# Patient Record
Sex: Male | Born: 1950
Health system: Southern US, Community
[De-identification: ages and names within clinical notes are randomized; demographics above are authoritative.]

## PROBLEM LIST (undated history)

## (undated) DIAGNOSIS — F32A Depression, unspecified: Secondary | ICD-10-CM

## (undated) DIAGNOSIS — H9190 Unspecified hearing loss, unspecified ear: Secondary | ICD-10-CM

## (undated) DIAGNOSIS — H33319 Horseshoe tear of retina without detachment, unspecified eye: Secondary | ICD-10-CM

## (undated) DIAGNOSIS — F329 Major depressive disorder, single episode, unspecified: Secondary | ICD-10-CM

## (undated) DIAGNOSIS — M545 Low back pain, unspecified: Secondary | ICD-10-CM

## (undated) DIAGNOSIS — F419 Anxiety disorder, unspecified: Secondary | ICD-10-CM

## (undated) DIAGNOSIS — E785 Hyperlipidemia, unspecified: Secondary | ICD-10-CM

## (undated) DIAGNOSIS — B029 Zoster without complications: Secondary | ICD-10-CM

## (undated) DIAGNOSIS — M1990 Unspecified osteoarthritis, unspecified site: Secondary | ICD-10-CM

## (undated) DIAGNOSIS — G8929 Other chronic pain: Secondary | ICD-10-CM

## (undated) DIAGNOSIS — M5412 Radiculopathy, cervical region: Secondary | ICD-10-CM

## (undated) DIAGNOSIS — F4320 Adjustment disorder, unspecified: Secondary | ICD-10-CM

## (undated) DIAGNOSIS — L409 Psoriasis, unspecified: Secondary | ICD-10-CM

## (undated) HISTORY — DX: Psoriasis, unspecified: L40.9

## (undated) HISTORY — PX: EYE SURGERY: SHX253

## (undated) HISTORY — DX: Horseshoe tear of retina without detachment, unspecified eye: H33.319

## (undated) HISTORY — PX: OTHER SURGICAL HISTORY: SHX169

## (undated) HISTORY — DX: Radiculopathy, cervical region: M54.12

## (undated) HISTORY — DX: Other chronic pain: G89.29

## (undated) HISTORY — PX: CATARACT EXTRACTION: SUR2

## (undated) HISTORY — DX: Anxiety disorder, unspecified: F41.9

## (undated) HISTORY — DX: Major depressive disorder, single episode, unspecified: F32.9

## (undated) HISTORY — DX: Adjustment disorder, unspecified: F43.20

## (undated) HISTORY — PX: HEMORRHOID SURGERY: SHX153

## (undated) HISTORY — DX: Low back pain, unspecified: M54.50

## (undated) HISTORY — DX: Low back pain: M54.5

## (undated) HISTORY — DX: Hyperlipidemia, unspecified: E78.5

## (undated) HISTORY — DX: Depression, unspecified: F32.A

---

## 2005-09-25 ENCOUNTER — Ambulatory Visit: Payer: Self-pay | Admitting: Internal Medicine

## 2005-12-03 ENCOUNTER — Ambulatory Visit: Payer: Self-pay | Admitting: Internal Medicine

## 2006-04-30 ENCOUNTER — Ambulatory Visit: Payer: Self-pay | Admitting: Internal Medicine

## 2006-09-27 ENCOUNTER — Encounter: Payer: Self-pay | Admitting: Internal Medicine

## 2006-09-27 DIAGNOSIS — H903 Sensorineural hearing loss, bilateral: Secondary | ICD-10-CM | POA: Insufficient documentation

## 2006-09-27 DIAGNOSIS — F329 Major depressive disorder, single episode, unspecified: Secondary | ICD-10-CM

## 2006-09-27 DIAGNOSIS — F411 Generalized anxiety disorder: Secondary | ICD-10-CM | POA: Insufficient documentation

## 2006-09-27 DIAGNOSIS — M545 Low back pain: Secondary | ICD-10-CM

## 2006-10-14 ENCOUNTER — Ambulatory Visit: Payer: Self-pay | Admitting: Internal Medicine

## 2006-10-28 ENCOUNTER — Encounter: Admission: RE | Admit: 2006-10-28 | Discharge: 2006-10-28 | Payer: Self-pay | Admitting: Internal Medicine

## 2006-12-03 ENCOUNTER — Ambulatory Visit: Payer: Self-pay | Admitting: Internal Medicine

## 2006-12-03 LAB — CONVERTED CEMR LAB
Basophils Relative: 0.1 % (ref 0.0–1.0)
Bilirubin Urine: NEGATIVE
CO2: 31 meq/L (ref 19–32)
Calcium: 9.4 mg/dL (ref 8.4–10.5)
Creatinine, Ser: 0.8 mg/dL (ref 0.4–1.5)
Eosinophils Relative: 2.3 % (ref 0.0–5.0)
HCT: 44.3 % (ref 39.0–52.0)
HDL: 38.6 mg/dL — ABNORMAL LOW (ref >39.0)
Hemoglobin: 15.5 g/dL (ref 13.0–17.0)
Ketones, ur: NEGATIVE mg/dL
MCHC: 34.9 g/dL (ref 30.0–36.0)
MCV: 89.1 fL (ref 78.0–100.0)
Monocytes Relative: 11.5 % — ABNORMAL HIGH (ref 3.0–11.0)
Neutro Abs: 3.6 10*3/uL (ref 1.4–7.7)
Potassium: 4.8 meq/L (ref 3.5–5.1)
RDW: 12.3 % (ref 11.5–14.6)
Specific Gravity, Urine: 1.02 (ref 1.000–1.03)
Total Bilirubin: 0.8 mg/dL (ref 0.3–1.2)
Total Protein, Urine: NEGATIVE mg/dL
Triglycerides: 97 mg/dL (ref 0–149)
Urine Glucose: NEGATIVE mg/dL
Urobilinogen, UA: 0.2 (ref 0.0–1.0)
WBC: 7.2 10*3/uL (ref 4.5–10.5)
pH: 5.5 (ref 5.0–8.0)

## 2006-12-09 ENCOUNTER — Ambulatory Visit: Payer: Self-pay | Admitting: Internal Medicine

## 2006-12-09 DIAGNOSIS — E785 Hyperlipidemia, unspecified: Secondary | ICD-10-CM

## 2006-12-09 DIAGNOSIS — J309 Allergic rhinitis, unspecified: Secondary | ICD-10-CM

## 2007-02-17 ENCOUNTER — Ambulatory Visit: Payer: Self-pay | Admitting: Internal Medicine

## 2007-02-17 DIAGNOSIS — R5383 Other fatigue: Secondary | ICD-10-CM | POA: Insufficient documentation

## 2007-02-17 DIAGNOSIS — R5381 Other malaise: Secondary | ICD-10-CM

## 2007-04-01 ENCOUNTER — Encounter: Payer: Self-pay | Admitting: Internal Medicine

## 2007-12-03 ENCOUNTER — Ambulatory Visit: Payer: Self-pay | Admitting: Internal Medicine

## 2008-01-21 DIAGNOSIS — F4320 Adjustment disorder, unspecified: Secondary | ICD-10-CM

## 2008-01-21 HISTORY — DX: Adjustment disorder, unspecified: F43.20

## 2008-03-20 ENCOUNTER — Telehealth (INDEPENDENT_AMBULATORY_CARE_PROVIDER_SITE_OTHER): Payer: Self-pay | Admitting: *Deleted

## 2008-04-28 ENCOUNTER — Ambulatory Visit: Payer: Self-pay | Admitting: Internal Medicine

## 2008-04-28 DIAGNOSIS — K648 Other hemorrhoids: Secondary | ICD-10-CM

## 2008-05-04 ENCOUNTER — Ambulatory Visit: Payer: Self-pay | Admitting: Internal Medicine

## 2008-05-04 LAB — CONVERTED CEMR LAB
Albumin: 4 g/dL (ref 3.5–5.2)
Basophils Relative: 0.2 % (ref 0.0–3.0)
Calcium: 8.9 mg/dL (ref 8.4–10.5)
Chloride: 104 meq/L (ref 96–112)
Cholesterol: 184 mg/dL (ref 0–200)
Creatinine, Ser: 0.9 mg/dL (ref 0.4–1.5)
Eosinophils Absolute: 0.1 10*3/uL (ref 0.0–0.7)
GFR calc non Af Amer: 92.89 mL/min (ref >60)
Glucose, Bld: 88 mg/dL (ref 70–99)
Hemoglobin: 14.7 g/dL (ref 13.0–17.0)
LDL Cholesterol: 126 mg/dL — ABNORMAL HIGH (ref 0–99)
Lymphocytes Relative: 43 % (ref 12.0–46.0)
MCHC: 35.1 g/dL (ref 30.0–36.0)
MCV: 89.8 fL (ref 78.0–100.0)
Neutrophils Relative %: 45.4 % (ref 43.0–77.0)
Platelets: 163 10*3/uL (ref 150.0–400.0)
Potassium: 4.1 meq/L (ref 3.5–5.1)
RBC: 4.65 M/uL (ref 4.22–5.81)
TSH: 1.92 microintl units/mL (ref 0.35–5.50)
Total Protein, Urine: NEGATIVE mg/dL
Triglycerides: 99 mg/dL (ref 0.0–149.0)
Urine Glucose: NEGATIVE mg/dL
Urobilinogen, UA: 0.2 (ref 0.0–1.0)
WBC: 6.4 10*3/uL (ref 4.5–10.5)
pH: 5.5 (ref 5.0–8.0)

## 2008-05-10 ENCOUNTER — Ambulatory Visit: Payer: Self-pay | Admitting: Internal Medicine

## 2008-07-25 ENCOUNTER — Ambulatory Visit: Payer: Self-pay | Admitting: Internal Medicine

## 2008-10-19 ENCOUNTER — Ambulatory Visit: Payer: Self-pay | Admitting: Internal Medicine

## 2008-12-05 ENCOUNTER — Telehealth: Payer: Self-pay | Admitting: Internal Medicine

## 2009-02-09 ENCOUNTER — Telehealth: Payer: Self-pay | Admitting: Internal Medicine

## 2009-02-28 ENCOUNTER — Ambulatory Visit: Payer: Self-pay | Admitting: Internal Medicine

## 2009-05-23 ENCOUNTER — Ambulatory Visit: Payer: Self-pay | Admitting: Internal Medicine

## 2009-05-23 LAB — CONVERTED CEMR LAB
ALT: 24 units/L (ref 0–53)
AST: 24 units/L (ref 0–37)
Albumin: 4.3 g/dL (ref 3.5–5.2)
Alkaline Phosphatase: 48 units/L (ref 39–117)
BUN: 11 mg/dL (ref 6–23)
Basophils Absolute: 0 10*3/uL (ref 0.0–0.1)
Basophils Relative: 0.7 % (ref 0.0–3.0)
Bilirubin Urine: NEGATIVE
Bilirubin, Direct: 0.1 mg/dL (ref 0.0–0.3)
CO2: 31 meq/L (ref 19–32)
Calcium: 9.2 mg/dL (ref 8.4–10.5)
Chloride: 104 meq/L (ref 96–112)
Cholesterol: 220 mg/dL — ABNORMAL HIGH (ref 0–200)
Creatinine, Ser: 1 mg/dL (ref 0.4–1.5)
Direct LDL: 145.8 mg/dL
Eosinophils Absolute: 0.1 10*3/uL (ref 0.0–0.7)
Eosinophils Relative: 2 % (ref 0.0–5.0)
GFR calc non Af Amer: 81.94 mL/min (ref >60)
Glucose, Bld: 89 mg/dL (ref 70–99)
HCT: 41 % (ref 39.0–52.0)
HDL: 41.2 mg/dL (ref >39.00)
Hemoglobin, Urine: NEGATIVE
Hemoglobin: 14.3 g/dL (ref 13.0–17.0)
Ketones, ur: NEGATIVE mg/dL
Leukocytes, UA: NEGATIVE
Lymphocytes Relative: 39.2 % (ref 12.0–46.0)
Lymphs Abs: 2.4 10*3/uL (ref 0.7–4.0)
MCHC: 34.9 g/dL (ref 30.0–36.0)
MCV: 90.2 fL (ref 78.0–100.0)
Monocytes Absolute: 0.5 10*3/uL (ref 0.1–1.0)
Monocytes Relative: 8.2 % (ref 3.0–12.0)
Neutro Abs: 3.1 10*3/uL (ref 1.4–7.7)
Neutrophils Relative %: 49.9 % (ref 43.0–77.0)
Nitrite: NEGATIVE
PSA: 0.88 ng/mL (ref 0.10–4.00)
Platelets: 181 10*3/uL (ref 150.0–400.0)
Potassium: 4.7 meq/L (ref 3.5–5.1)
RBC: 4.54 M/uL (ref 4.22–5.81)
RDW: 13.5 % (ref 11.5–14.6)
Sodium: 141 meq/L (ref 135–145)
Specific Gravity, Urine: 1.025 (ref 1.000–1.030)
TSH: 2.13 microintl units/mL (ref 0.35–5.50)
Total Bilirubin: 0.7 mg/dL (ref 0.3–1.2)
Total CHOL/HDL Ratio: 5
Total Protein, Urine: NEGATIVE mg/dL
Total Protein: 7.6 g/dL (ref 6.0–8.3)
Triglycerides: 156 mg/dL — ABNORMAL HIGH (ref 0.0–149.0)
Urine Glucose: NEGATIVE mg/dL
Urobilinogen, UA: 0.2 (ref 0.0–1.0)
VLDL: 31.2 mg/dL (ref 0.0–40.0)
WBC: 6.2 10*3/uL (ref 4.5–10.5)
pH: 6.5 (ref 5.0–8.0)

## 2009-05-31 ENCOUNTER — Ambulatory Visit: Payer: Self-pay | Admitting: Internal Medicine

## 2009-05-31 DIAGNOSIS — M79609 Pain in unspecified limb: Secondary | ICD-10-CM

## 2009-09-12 ENCOUNTER — Ambulatory Visit: Payer: Self-pay | Admitting: Internal Medicine

## 2009-09-12 DIAGNOSIS — D409 Neoplasm of uncertain behavior of male genital organ, unspecified: Secondary | ICD-10-CM | POA: Insufficient documentation

## 2009-09-13 LAB — CONVERTED CEMR LAB: GC Probe Amp, Urine: NEGATIVE

## 2009-10-03 ENCOUNTER — Ambulatory Visit: Payer: Self-pay | Admitting: Internal Medicine

## 2009-11-20 ENCOUNTER — Telehealth: Payer: Self-pay | Admitting: Internal Medicine

## 2009-11-20 DIAGNOSIS — R21 Rash and other nonspecific skin eruption: Secondary | ICD-10-CM

## 2010-01-11 ENCOUNTER — Ambulatory Visit: Payer: Self-pay | Admitting: Internal Medicine

## 2010-01-11 DIAGNOSIS — L408 Other psoriasis: Secondary | ICD-10-CM

## 2010-02-19 NOTE — Assessment & Plan Note (Signed)
Summary: MALE PROBLEM/ DID NOT ELABORATE/NWS  #   Vital Signs:  Patient profile:   60 year old male Height:      66 inches Weight:      158.13 pounds BMI:     25.62 O2 Sat:      97 % on Room air Temp:     97 degrees F oral Pulse rate:   75 / minute BP sitting:   102 / 70  (left arm) Cuff size:   regular  Vitals Entered By: Zella Ball Ewing CMA Duncan Dull) (September 12, 2009 11:31 AM)  O2 Flow:  Room air  CC: Male problem/RE   CC:  Male problem/RE.  History of Present Illness: here to f/u - needs podiatry for nail care as he cannot bend that far due to the chronic back impairment to get this done;  Pt denies CP, worsening sob, doe, wheezing, orthopnea, pnd, worsening LE edema, palps, dizziness or syncope  Pt denies new neuro symptoms such as headache, facial or extremity weakness  No fever, wt loss, night sweats, loss of appetite or other constitutional symptoms  No worsening depressive symptoms , suicidal ideation or panic, though stress has increased remarkably since june when he was informed by his wife of her affair outside the marriage.  He has agreed to divorce , and today c/o several weeks of scab like lesion to the right glans penis that cont's to recur and jsut wont heal, with mild itch and irritation.  No GU symptoms such as dysuria, freq, urgency, or hematuria;  no penile d/c or other lesions, pain or swelling.      Problems Prior to Update: 1)  Sexually Transmitted Disease, Exposure To  (ICD-V01.6) 2)  Penile Lesion  (ICD-236.6) 3)  Hand Pain, Bilateral  (ICD-729.5) 4)  Preventive Health Care  (ICD-V70.0) 5)  Hemorrhoids  (ICD-455.6) 6)  Fatigue  (ICD-780.79) 7)  Preventive Health Care  (ICD-V70.0) 8)  Health Maintenance Exam  (ICD-V70.0) 9)  Hyperlipidemia  (ICD-272.4) 10)  Allergic Rhinitis  (ICD-477.9) 11)  Special Screening Malignant Neoplasm of Prostate  (ICD-V76.44) 12)  Routine General Medical Exam@health  Care Facl  (ICD-V70.0) 13)  Loss, Sensorineural Hearing, Bilat   (ICD-389.18) 14)  Low Back Pain  (ICD-724.2) 15)  Depression  (ICD-311) 16)  Anxiety  (ICD-300.00)  Medications Prior to Update: 1)  Claritin 10 Mg Caps (Loratadine) .Marland Kitchen.. 1 By Mouth Once Daily 2)  Alprazolam 0.5 Mg Tabs (Alprazolam) .... 1/2 - 1 By Mouth Once Daily As Needed 3)  Motrin Ib 200 Mg Tabs (Ibuprofen) .... As Needed For Back Pain 4)  Bufferin 325 Mg Tabs (Aspirin Buf(Cacarb-Mgcarb-Mgo)) .... As Needed For Back Pain 5)  Voltaren 1 % Gel (Diclofenac Sodium) .... Use Asd Two Times A Day As Needed  Current Medications (verified): 1)  Claritin 10 Mg Caps (Loratadine) .Marland Kitchen.. 1 By Mouth Once Daily 2)  Alprazolam 0.5 Mg Tabs (Alprazolam) .... 1/2 - 1 By Mouth Once Daily As Needed 3)  Motrin Ib 200 Mg Tabs (Ibuprofen) .... As Needed For Back Pain 4)  Bufferin 325 Mg Tabs (Aspirin Buf(Cacarb-Mgcarb-Mgo)) .... As Needed For Back Pain 5)  Imiquimod 5 % Crea (Imiquimod) .... Use Asd To Affected Area 3 Times Per Wk At Night ,wash Off Next Am, For Maximum 16 Wks  Allergies (verified): No Known Drug Allergies  Past History:  Past Medical History: Last updated: 04/28/2008 Anxiety Depression adjustment disorder with depressive symptoms 1/10 Low back pain Hemorrhoids Bilateral sensorineural hearing loss bilat retinal tear - no  surgury Allergic rhinitis Hyperlipidemia  Past Surgical History: Last updated: 12/09/2006 Hemorrhoidectomy  Social History: Last updated: 05/31/2009 Never Smoked Alcohol use-no disabled Married 2 children Drug use-no  Risk Factors: Alcohol Use: 0 (05/31/2009) Caffeine Use: 3 x weekly (05/31/2009) Exercise: no (05/31/2009)  Risk Factors: Smoking Status: never (05/31/2009)  Review of Systems       all otherwise negative per pt -    Physical Exam  General:  alert and well-developed.  , walks with cane Head:  normocephalic and atraumatic.   Eyes:  vision grossly intact, pupils equal, and pupils round.   Ears:  R ear normal and L ear normal.     Nose:  no external deformity and no nasal discharge.   Mouth:  no gingival abnormalities and pharynx pink and moist.   Neck:  supple and no masses.   Lungs:  normal respiratory effort and normal breath sounds.   Heart:  normal rate and regular rhythm.   Abdomen:  soft, non-tender, and normal bowel sounds.   Genitalia:  right glans penis with approx 8 mm ? warty type lesion Msk:  no joint tenderness and no joint swelling.   Extremities:  no edema, no erythema  Skin:  toenails long, need trimming Psych:  dysphoric affect and subdued.     Impression & Recommendations:  Problem # 1:  PENILE LESION (ICD-236.6) ? wart vs dermatitis - ok to try aldara for now, consider trial triam cr later if not improved, or derm consult  Problem # 2:  SEXUALLY TRANSMITTED DISEASE, EXPOSURE TO (ICD-V01.6) pt requests STD evaluatin, exam o/w benign  Orders: T-Chlamydia & GC Probe, Urine ([id number]) T-HIV-1 (Screen) (78469) T-Syphilis Test (RPR) (62952-84132) T-Herpes Simplex Type 2 (44010-27253)  Problem # 3:  TOE PAIN (ICD-729.5) for podiatry care Orders: Podiatry Referral (Podiatry)  Problem # 4:  ANXIETY (ICD-300.00)  His updated medication list for this problem includes:    Alprazolam 0.5 Mg Tabs (Alprazolam) .Marland Kitchen... 1/2 - 1 by mouth once daily as needed stable overall by hx and exam, ok to continue meds/tx as is - declines any further meds or counseling  Complete Medication List: 1)  Claritin 10 Mg Caps (Loratadine) .Marland Kitchen.. 1 by mouth once daily 2)  Alprazolam 0.5 Mg Tabs (Alprazolam) .... 1/2 - 1 by mouth once daily as needed 3)  Motrin Ib 200 Mg Tabs (Ibuprofen) .... As needed for back pain 4)  Bufferin 325 Mg Tabs (Aspirin buf(cacarb-mgcarb-mgo)) .... As needed for back pain 5)  Imiquimod 5 % Crea (Imiquimod) .... Use asd to affected area 3 times per wk at night ,wash off next am, for maximum 16 wks  Patient Instructions: 1)  Please take all new medications as prescribed 2)   Continue all previous medications as before this visit  3)  Please go to the Lab in the basement for your blood and/or urine tests today 4)  You will be contacted about the referral(s) to: Podiatry 5)  Please schedule a follow-up appointment in May 2012 for yearly exam, or sooner if needed Prescriptions: IMIQUIMOD 5 % CREA (IMIQUIMOD) use asd to affected area 3 times per wk at night ,wash off next AM, for maximum 16 wks  #1 x 2   Entered and Authorized by:   Corwin Levins MD   Signed by:   Corwin Levins MD on 09/12/2009   Method used:   Print then Give to Patient   RxID:   [id number]

## 2010-02-19 NOTE — Assessment & Plan Note (Signed)
Summary: 2 MTH PHYSICAL--STC   Vital Signs:  Patient profile:   60 year old male Height:      65 inches Weight:      159.03 pounds BMI:     26.56 Temp:     97.4 degrees F oral Pulse rate:   90 / minute Pulse rhythm:   regular Resp:     16 per minute BP sitting:   124 / 80  (left arm) Cuff size:   regular  Vitals Entered By: Mervin Kung CMA (May 31, 2009 10:37 AM)  Preventive Care Screening     deckines colonsocpy  CC: room B2  40month physical Is Patient Diabetic? No   CC:  room B2  40month physical.  History of Present Illness: overall doing well; has skin lesion to the left max sinus area stable for years;  trying to follow lower chol diet.  also c/o soft tissue swelling to the hands with walking with the cane more as well as diffuse mild discomfort;  Pt denies CP, sob, doe, wheezing, orthopnea, pnd, worsening LE edema, palps, dizziness or syncope   Pt denies new neuro symptoms such as headache, facial or extremity weakness   Preventive Screening-Counseling & Management  Alcohol-Tobacco     Alcohol drinks/day: 0     Smoking Status: never  Caffeine-Diet-Exercise     Caffeine use/day: 3 x weekly     Does Patient Exercise: no      Drug Use:  no.    Problems Prior to Update: 1)  Hand Pain, Bilateral  (ICD-729.5) 2)  Preventive Health Care  (ICD-V70.0) 3)  Hemorrhoids  (ICD-455.6) 4)  Fatigue  (ICD-780.79) 5)  Preventive Health Care  (ICD-V70.0) 6)  Health Maintenance Exam  (ICD-V70.0) 7)  Hyperlipidemia  (ICD-272.4) 8)  Allergic Rhinitis  (ICD-477.9) 9)  Special Screening Malignant Neoplasm of Prostate  (ICD-V76.44) 10)  Routine General Medical Exam@health  Care Facl  (ICD-V70.0) 11)  Loss, Sensorineural Hearing, Bilat  (ICD-389.18) 12)  Low Back Pain  (ICD-724.2) 13)  Depression  (ICD-311) 14)  Anxiety  (ICD-300.00)  Medications Prior to Update: 1)  Claritin 10 Mg Caps (Loratadine) .Marland Kitchen.. 1 By Mouth Once Daily 2)  Alprazolam 0.5 Mg Tabs (Alprazolam) ....  1/2 - 1 By Mouth Once Daily As Needed  Current Medications (verified): 1)  Claritin 10 Mg Caps (Loratadine) .Marland Kitchen.. 1 By Mouth Once Daily 2)  Alprazolam 0.5 Mg Tabs (Alprazolam) .... 1/2 - 1 By Mouth Once Daily As Needed 3)  Motrin Ib 200 Mg Tabs (Ibuprofen) .... As Needed For Back Pain 4)  Bufferin 325 Mg Tabs (Aspirin Buf(Cacarb-Mgcarb-Mgo)) .... As Needed For Back Pain 5)  Voltaren 1 % Gel (Diclofenac Sodium) .... Use Asd Two Times A Day As Needed  Allergies (verified): No Known Drug Allergies  Past History:  Past Medical History: Last updated: 04/28/2008 Anxiety Depression adjustment disorder with depressive symptoms 1/10 Low back pain Hemorrhoids Bilateral sensorineural hearing loss bilat retinal tear - no surgury Allergic rhinitis Hyperlipidemia  Past Surgical History: Last updated: 12/09/2006 Hemorrhoidectomy  Family History: Last updated: 12/09/2006 Family History Lung cancer - father Bladder cancer - mother grandparents with DM  Social History: Last updated: 05/31/2009 Never Smoked Alcohol use-no disabled Married 2 children Drug use-no  Risk Factors: Alcohol Use: 0 (05/31/2009) Caffeine Use: 3 x weekly (05/31/2009) Exercise: no (05/31/2009)  Risk Factors: Smoking Status: never (05/31/2009)  Social History: Reviewed history from 04/28/2008 and no changes required. Never Smoked Alcohol use-no disabled Married 2 children Drug use-no Caffeine use/day:  3 x weekly Does Patient Exercise:  no Drug Use:  no  Review of Systems  The patient denies anorexia, fever, weight loss, weight gain, vision loss, decreased hearing, hoarseness, chest pain, syncope, dyspnea on exertion, prolonged cough, headaches, hemoptysis, abdominal pain, melena, hematochezia, severe indigestion/heartburn, hematuria, muscle weakness, suspicious skin lesions, difficulty walking, depression, unusual weight change, abnormal bleeding, enlarged lymph nodes, and angioedema.          all otherwise negative per pt -    Physical Exam  General:  alert and well-developed.   Head:  normocephalic and atraumatic.   Eyes:  vision grossly intact, pupils equal, and pupils round.   Ears:  R ear normal and L ear normal.   Nose:  no external deformity and no nasal discharge.   Mouth:  no gingival abnormalities and pharynx pink and moist.   Neck:  supple and no masses.   Lungs:  normal respiratory effort and normal breath sounds.   Heart:  normal rate and regular rhythm.   Abdomen:  soft, non-tender, and normal bowel sounds.   Msk:  no joint tenderness and no joint swelling.   Extremities:  no edema, no erythema  Neurologic:  cranial nerves II-XII intact and strength normal in all extremities.   Skin:  has mild palmar erythema /puffiness without tender bilat Psych:  not depressed appearing and moderately anxious.     Impression & Recommendations:  Problem # 1:  Preventive Health Care (ICD-V70.0) Overall doing well, age appropriate education and counseling updated and referral for appropriate preventive services done unless declined, immunizations up to date or declined, diet counseling done if overweight, urged to quit smoking if smokes , most recent labs reviewed and current ordered if appropriate, ecg reviewed or declined (interpretation per ECG scanned in the EMR if done); information regarding Medicare Prevention requirements given if appropriate; speciality referrals updated as appropriate  Orders: EKG w/ Interpretation (93000)  Problem # 2:  HYPERLIPIDEMIA (ICD-272.4)  to cont diet, declines statin  Labs Reviewed: SGOT: 24 (05/23/2009)   SGPT: 24 (05/23/2009)   HDL:41.20 (05/23/2009), 37.90 (05/04/2008)  LDL:126 (05/04/2008), 140 (12/03/2006)  Chol:220 (05/23/2009), 184 (05/04/2008)  Trig:156.0 (05/23/2009), 99.0 (05/04/2008)  Problem # 3:  HAND PAIN, BILATERAL (ICD-729.5) for voltaren gel as needed   Complete Medication List: 1)  Claritin 10 Mg Caps (Loratadine)  .Marland Kitchen.. 1 by mouth once daily 2)  Alprazolam 0.5 Mg Tabs (Alprazolam) .... 1/2 - 1 by mouth once daily as needed 3)  Motrin Ib 200 Mg Tabs (Ibuprofen) .... As needed for back pain 4)  Bufferin 325 Mg Tabs (Aspirin buf(cacarb-mgcarb-mgo)) .... As needed for back pain 5)  Voltaren 1 % Gel (Diclofenac sodium) .... Use asd two times a day as needed  Patient Instructions: 1)  Please take all new medications as prescribed  2)  Continue all previous medications as before this visit  3)  please call if you change your mind about the colonoscopy 4)  Please schedule a follow-up appointment in 1 year or sooner if needed Prescriptions: VOLTAREN 1 % GEL (DICLOFENAC SODIUM) use asd two times a day as needed  #1 x 11   Entered and Authorized by:   Corwin Levins MD   Signed by:   Corwin Levins MD on 05/31/2009   Method used:   Print then Give to Patient   RxID:   [id number]   Current Allergies (reviewed today): No known allergies

## 2010-02-19 NOTE — Assessment & Plan Note (Signed)
Summary: IRRITABLE--NERVOUS  X 2 WKS  STC   Vital Signs:  Patient profile:   60 year old male Height:      65 inches Weight:      165 pounds BMI:     27.56 O2 Sat:      98 % on Room air Temp:     97.6 degrees F oral Pulse rate:   72 / minute BP sitting:   114 / 80  (left arm) Cuff size:   regular  Vitals Entered ByZella Ball Ewing (February 28, 2009 11:24 AM)  O2 Flow:  Room air CC: nervous past few weeks/RE   CC:  nervous past few weeks/RE.  History of Present Illness: here with c/o ongoing but mild worse anxiety symptom with shakiness, palps, sob , weakness and fearfulness when he is required to leave the house;  o/w does have some depressive symtpoms iwth fatigue and anhdenia, but no suicidal ideation or panic symptoms o/w.  Here to ask for med to take as needed to leave the house so that he can cont to function such as getting to provider visits and grocery;  still with chronic pain and impiarment, with fair overall control.  Pt denies CP, sob, doe, wheezing, orthopnea, pnd, worsening LE edema, palps, dizziness or syncope   Pt denies new neuro symptoms such as headache, facial or extremity weakness   Problems Prior to Update: 1)  Preventive Health Care  (ICD-V70.0) 2)  Hemorrhoids  (ICD-455.6) 3)  Fatigue  (ICD-780.79) 4)  Preventive Health Care  (ICD-V70.0) 5)  Health Maintenance Exam  (ICD-V70.0) 6)  Hyperlipidemia  (ICD-272.4) 7)  Allergic Rhinitis  (ICD-477.9) 8)  Special Screening Malignant Neoplasm of Prostate  (ICD-V76.44) 9)  Routine General Medical Exam@health  Care Facl  (ICD-V70.0) 10)  Loss, Sensorineural Hearing, Bilat  (ICD-389.18) 11)  Low Back Pain  (ICD-724.2) 12)  Depression  (ICD-311) 13)  Anxiety  (ICD-300.00)  Medications Prior to Update: 1)  Wellbutrin Xl 150 Mg  Tb24 (Bupropion Hcl) .... 2 By Mouth Qd 2)  Claritin 10 Mg Caps (Loratadine) .Marland Kitchen.. 1 By Mouth Once Daily 3)  Azithromycin 250 Mg Tabs (Azithromycin) .... 2po Qd For 1 Day, Then 1po Qd For  4days, Then Stop  Current Medications (verified): 1)  Claritin 10 Mg Caps (Loratadine) .Marland Kitchen.. 1 By Mouth Once Daily 2)  Alprazolam 0.5 Mg Tabs (Alprazolam) .... 1/2 - 1 By Mouth Once Daily As Needed  Allergies (verified): No Known Drug Allergies  Past History:  Past Medical History: Last updated: 04/28/2008 Anxiety Depression adjustment disorder with depressive symptoms 1/10 Low back pain Hemorrhoids Bilateral sensorineural hearing loss bilat retinal tear - no surgury Allergic rhinitis Hyperlipidemia  Past Surgical History: Last updated: 12/09/2006 Hemorrhoidectomy  Social History: Last updated: 04/28/2008 Never Smoked Alcohol use-no disabled Married 2 children  Risk Factors: Smoking Status: never (12/09/2006)  Review of Systems       all otherwise negative per pt -   Physical Exam  General:  alert and overweight-appearing.  , not ill appearing, walks with cane Head:  normocephalic and atraumatic.   Eyes:  vision grossly intact, pupils equal, and pupils round.   Ears:  R ear normal and L ear normal.   Nose:  no external deformity and no nasal discharge.   Mouth:  no gingival abnormalities and pharynx pink and moist.   Neck:  supple and no masses.   Lungs:  normal respiratory effort and normal breath sounds.   Heart:  normal rate and regular  rhythm.   Extremities:  no edema, no erythema  Neurologic:  alert & oriented X3 and cranial nerves II-XII intact.   Psych:  depressed affect and moderately anxious.     Impression & Recommendations:  Problem # 1:  DEPRESSION (ICD-311)  The following medications were removed from the medication list:    Wellbutrin Xl 150 Mg Tb24 (Bupropion hcl) .Marland Kitchen... 2 by mouth qd His updated medication list for this problem includes:    Alprazolam 0.5 Mg Tabs (Alprazolam) .Marland Kitchen... 1/2 - 1 by mouth once daily as needed decines further meds, to cont counseling,  declines other med at this time  Problem # 2:  ANXIETY (ICD-300.00)  The  following medications were removed from the medication list:    Wellbutrin Xl 150 Mg Tb24 (Bupropion hcl) .Marland Kitchen... 2 by mouth qd His updated medication list for this problem includes:    Alprazolam 0.5 Mg Tabs (Alprazolam) .Marland Kitchen... 1/2 - 1 by mouth once daily as needed treat as above, f/u any worsening signs or symptoms , declines psychiatry referral  Problem # 3:  LOW BACK PAIN (ICD-724.2) stable overall by hx and exam, ok to continue meds/tx as is , declines further tx, no falls and does not need PT it seems, cont cane, f/u next visit  Problem # 4:  FATIGUE (ICD-780.79) most likely due to above, declines labs now;  will check with next visit  Complete Medication List: 1)  Claritin 10 Mg Caps (Loratadine) .Marland Kitchen.. 1 by mouth once daily 2)  Alprazolam 0.5 Mg Tabs (Alprazolam) .... 1/2 - 1 by mouth once daily as needed  Patient Instructions: 1)  Please take all new medications as prescribed 2)  Continue all previous medications as before this visit  3)  please call if you would like a different daily medication for depression 4)  Please schedule a follow-up appointment in 2 months with CPX labs Prescriptions: ALPRAZOLAM 0.5 MG TABS (ALPRAZOLAM) 1/2 - 1 by mouth once daily as needed  #30 x 2   Entered and Authorized by:   Corwin Levins MD   Signed by:   Corwin Levins MD on 02/28/2009   Method used:   Print then Give to Patient   RxID:   [id number]

## 2010-02-19 NOTE — Progress Notes (Signed)
Summary: referral  Phone Note Call from Patient Call back at Home Phone [phone]   Caller: Patient Summary of Call: Pt called stating that he was advised at last OV that if cream Rxd by [facility] did not help he would be referred to [facility]. Pt is requesting referral as cream not helpful. Initial call taken by: Margaret Pyle, CMA,  November 20, 2009 1:01 PM  Follow-up for Phone Call        ok for referrral - I will do Follow-up by: Corwin Levins MD,  November 20, 2009 1:04 PM  Additional Follow-up for Phone Call Additional follow up Details #1::        Pt informed Additional Follow-up by: Margaret Pyle, CMA,  November 20, 2009 1:34 PM  New Problems: RASH-NONVESICULAR (ICD-782.1)   New Problems: RASH-NONVESICULAR (ICD-782.1)     Complete Medication List: 1)  Claritin 10 Mg Caps (Loratadine) .Marland Kitchen.. 1 by mouth once daily 2)  Alprazolam 0.5 Mg Tabs (Alprazolam) .... 1/2 - 1 by mouth once daily as needed 3)  Motrin Ib 200 Mg Tabs (Ibuprofen) .... As needed for back pain 4)  Bufferin 325 Mg Tabs (Aspirin buf(cacarb-mgcarb-mgo)) .... As needed for back pain 5)  Imiquimod 5 % Crea (Imiquimod) .... Use asd to affected area 3 times per wk at night ,wash off next am, for maximum 16 wks  Other Orders: Dermatology Referral (Derma)

## 2010-02-19 NOTE — Assessment & Plan Note (Signed)
Summary: flu shot/Ricky Kelly/cd  Nurse Visit   Allergies: No Known Drug Allergies  Orders Added: 1)  Admin 1st Vaccine [90471] 2)  Flu Vaccine 37yrs + [69629] Flu Vaccine Consent Questions     Do you have a history of severe allergic reactions to this vaccine? no    Any prior history of allergic reactions to egg and/or gelatin? no    Do you have a sensitivity to the preservative Thimersol? no    Do you have a past history of Guillan-Barre Syndrome? no    Do you currently have an acute febrile illness? no    Have you ever had a severe reaction to latex? no    Vaccine information given and explained to patient? yes    Are you currently pregnant? no    Lot Number:[id number]   Exp Date:07/20/2010   Site Given  Left Deltoid IMes-CCC]  .lbflu

## 2010-02-19 NOTE — Progress Notes (Signed)
Summary: Advised ER  Phone Note Call from Patient   Summary of Call: Pt woke up this am with chest heavyness/pressure, left arm pain & left sided neck pain. Some sob, which is worse when he bent over to put on his shoes. Advised ER but pt refused. He was scheduled for 2:30 and said would go to ER if symptoms changed or became worse.  Initial call taken by: Lamar Sprinkles, CMA,  February 09, 2009 10:20 AM  Follow-up for Phone Call        noted Follow-up by: Corwin Levins MD,  February 09, 2009 1:16 PM

## 2010-02-21 NOTE — Assessment & Plan Note (Signed)
Summary: DEPRESSION/ ALLERGIES/ [facility]   Vital Signs:  Patient profile:   60 year old male Height:      66 inches Weight:      156.25 pounds BMI:     25.31 O2 Sat:      98 % on Room air Temp:     97.9 degrees F oral Pulse rate:   77 / minute BP sitting:   132 / 72  (left arm) Cuff size:   regular  Vitals Entered By: Zella Ball Ewing CMA Duncan Dull) (January 11, 2010 10:04 AM)  O2 Flow:  Room air CC: Depression and allergies/RE   CC:  Depression and allergies/RE.  History of Present Illness: here to f/u; the cream did not seem to help at lsat visit; had biopsy of the lesion on the penis per derm 3 wks ago - called a wk later - nonmalignant on biopsy but c/w psoriasis; had antibx cream until healed, then given steroid cream but told not use until area healed - so wondering if he can start;    also c/o worsening depression, getting divorced, forced to move residence;  with feeling down most days, fatigue, sleeping more, mild wt loss (also with recent diet change) but denies suicidal ideation or panic, though has been also quite stressful.  Not interested in referral for couseling at this time as he has been involved in counsling already last few months     also c/o allergies not well controlled with nasal congestion and clearish d/c, without headache, sinus pain, pressuref, fever, or colored d/c, and no ST, cough and Pt denies CP, worsening sob, doe, wheezing, orthopnea, pnd, worsening LE edema, palps, dizziness or syncope  Pt denies new neuro symptoms such as headache, facial or extremity weakness  Pt denies polydipsia, polyuria   Overall good compliance with meds, trying to follow low chol, DM diet, wt stable, little excercise however   Problems Prior to Update: 1)  Psoriasis  (ICD-696.1) 2)  Rash-nonvesicular  (ICD-782.1) 3)  Toe Pain  (ICD-729.5) 4)  Sexually Transmitted Disease, Exposure To  (ICD-V01.6) 5)  Penile Lesion  (ICD-236.6) 6)  Hand Pain, Bilateral  (ICD-729.5) 7)  Preventive  Health Care  (ICD-V70.0) 8)  Hemorrhoids  (ICD-455.6) 9)  Fatigue  (ICD-780.79) 10)  Preventive Health Care  (ICD-V70.0) 11)  Health Maintenance Exam  (ICD-V70.0) 12)  Hyperlipidemia  (ICD-272.4) 13)  Allergic Rhinitis  (ICD-477.9) 14)  Special Screening Malignant Neoplasm of Prostate  (ICD-V76.44) 15)  Routine General Medical Exam@health  Care Facl  (ICD-V70.0) 16)  Loss, Sensorineural Hearing, Bilat  (ICD-389.18) 17)  Low Back Pain  (ICD-724.2) 18)  Depression  (ICD-311) 19)  Anxiety  (ICD-300.00)  Medications Prior to Update: 1)  Claritin 10 Mg Caps (Loratadine) .Marland Kitchen.. 1 By Mouth Once Daily 2)  Alprazolam 0.5 Mg Tabs (Alprazolam) .... 1/2 - 1 By Mouth Once Daily As Needed 3)  Motrin Ib 200 Mg Tabs (Ibuprofen) .... As Needed For Back Pain 4)  Bufferin 325 Mg Tabs (Aspirin Buf(Cacarb-Mgcarb-Mgo)) .... As Needed For Back Pain 5)  Imiquimod 5 % Crea (Imiquimod) .... Use Asd To Affected Area 3 Times Per Wk At Night ,wash Off Next Am, For Maximum 16 Wks  Current Medications (verified): 1)  Fexofenadine Hcl 180 Mg Tabs (Fexofenadine Hcl) .Marland Kitchen.. 1po Once Daily As Needed Allergies 2)  Alprazolam 0.5 Mg Tabs (Alprazolam) .... 1/2 - 1 By Mouth Once Daily As Needed 3)  Motrin Ib 200 Mg Tabs (Ibuprofen) .... As Needed For Back Pain 4)  Bufferin  325 Mg Tabs (Aspirin Buf(Cacarb-Mgcarb-Mgo)) .... As Needed For Back Pain 5)  Lexapro 10 Mg Tabs (Escitalopram Oxalate) .Marland Kitchen.. 1po Once Daily 6)  Fluticasone Propionate 50 Mcg/act Susp (Fluticasone Propionate) .... 2 Spray/side Once Daily  Allergies (verified): No Known Drug Allergies  Past History:  Past Surgical History: Last updated: 12/09/2006 Hemorrhoidectomy  Social History: Last updated: 05/31/2009 Never Smoked Alcohol use-no disabled Married 2 children Drug use-no  Risk Factors: Alcohol Use: 0 (05/31/2009) Caffeine Use: 3 x weekly (05/31/2009) Exercise: no (05/31/2009)  Risk Factors: Smoking Status: never (05/31/2009)  Past  Medical History: Anxiety Depression adjustment disorder with depressive symptoms 1/10 Low back pain Hemorrhoids Bilateral sensorineural hearing loss bilat retinal tear - no surgury Allergic rhinitis Hyperlipidemia psoriasis  Review of Systems       all otherwise negative per pt -    Physical Exam  General:  alert and well-developed.  , walks with cane Head:  normocephalic and atraumatic.   Eyes:  vision grossly intact, pupils equal, and pupils round.   Ears:  R ear normal and L ear normal.   Nose:  nasal dischargemucosal pallor and mucosal edema.   Mouth:  pharyngeal erythema and fair dentition.   Neck:  supple and no masses.   Lungs:  normal respiratory effort and normal breath sounds.   Heart:  normal rate and regular rhythm.   Extremities:  no edema, no erythema  Skin:  color normal and no rashes.  , penile lesion healed Psych:  depressed affect and moderately anxious.     Impression & Recommendations:  Problem # 1:  PENILE LESION (ICD-236.6)  c/w psoarisis - ok to start the cream, pt reassured  Problem # 2:  DEPRESSION (ICD-311)  His updated medication list for this problem includes:    Alprazolam 0.5 Mg Tabs (Alprazolam) .Marland Kitchen... 1/2 - 1 by mouth once daily as needed    Lexapro 10 Mg Tabs (Escitalopram oxalate) .Marland Kitchen... 1po once daily ok to add the lexapro 10 mg; Continue all previous medications as before this visit , to cont counseling as he does  Discussed treatment options, including trial of antidpressant medication. Patient agrees to call if any worsening of symptoms or thoughts of doing harm arise. Verified that the patient has no suicidal ideation at this time.   Problem # 3:  ANXIETY (ICD-300.00)  His updated medication list for this problem includes:    Alprazolam 0.5 Mg Tabs (Alprazolam) .Marland Kitchen... 1/2 - 1 by mouth once daily as needed    Lexapro 10 Mg Tabs (Escitalopram oxalate) .Marland Kitchen... 1po once daily Continue all previous medications as before this visit  - the  xanax  Problem # 4:  ALLERGIC RHINITIS (ICD-477.9)  His updated medication list for this problem includes:    Fexofenadine Hcl 180 Mg Tabs (Fexofenadine hcl) .Marland Kitchen... 1po once daily as needed allergies    Fluticasone Propionate 50 Mcg/act Susp (Fluticasone propionate) .Marland Kitchen... 2 spray/side once daily has new living situation with obvious allergy to dust/dust mites;  treat as above, f/u any worsening signs or symptoms   Discussed use of allergy medications and environmental measures.   Complete Medication List: 1)  Fexofenadine Hcl 180 Mg Tabs (Fexofenadine hcl) .Marland Kitchen.. 1po once daily as needed allergies 2)  Alprazolam 0.5 Mg Tabs (Alprazolam) .... 1/2 - 1 by mouth once daily as needed 3)  Motrin Ib 200 Mg Tabs (Ibuprofen) .... As needed for back pain 4)  Bufferin 325 Mg Tabs (Aspirin buf(cacarb-mgcarb-mgo)) .... As needed for back pain 5)  Lexapro 10  Mg Tabs (Escitalopram oxalate) .Marland Kitchen.. 1po once daily 6)  Fluticasone Propionate 50 Mcg/act Susp (Fluticasone propionate) .... 2 spray/side once daily  Patient Instructions: 1)  Please take all new medications as prescribed 2)  Continue all previous medications as before this visit  3)  Please continue the counseling as you do 4)  Please schedule a follow-up appointment in May 2012 for CPX with labs, or sooner if needed Prescriptions: FLUTICASONE PROPIONATE 50 MCG/ACT SUSP (FLUTICASONE PROPIONATE) 2 spray/side once daily  #1 x 11   Entered and Authorized by:   Corwin Levins MD   Signed by:   Corwin Levins MD on 01/11/2010   Method used:   Print then Give to Patient   RxID:   [id number] LEXAPRO 10 MG TABS (ESCITALOPRAM OXALATE) 1po once daily  #90 x 3   Entered and Authorized by:   Corwin Levins MD   Signed by:   Corwin Levins MD on 01/11/2010   Method used:   Print then Give to Patient   RxID:   [id number] ALPRAZOLAM 0.5 MG TABS (ALPRAZOLAM) 1/2 - 1 by mouth once daily as needed  #30 x 2   Entered and Authorized by:   Corwin Levins MD    Signed by:   Corwin Levins MD on 01/11/2010   Method used:   Print then Give to Patient   RxID:   [id number] FEXOFENADINE HCL 180 MG TABS (FEXOFENADINE HCL) 1po once daily as needed allergies  #90 x 3   Entered and Authorized by:   Corwin Levins MD   Signed by:   Corwin Levins MD on 01/11/2010   Method used:   Print then Give to Patient   RxID:   [id number]    Orders Added: 1)  Est. Patient Level IV [42595]

## 2010-06-19 ENCOUNTER — Other Ambulatory Visit: Payer: Self-pay

## 2010-06-26 ENCOUNTER — Encounter: Payer: Self-pay | Admitting: Internal Medicine

## 2010-08-06 ENCOUNTER — Other Ambulatory Visit (INDEPENDENT_AMBULATORY_CARE_PROVIDER_SITE_OTHER): Payer: Self-pay

## 2010-08-06 ENCOUNTER — Other Ambulatory Visit: Payer: Self-pay | Admitting: Internal Medicine

## 2010-08-06 DIAGNOSIS — Z0389 Encounter for observation for other suspected diseases and conditions ruled out: Secondary | ICD-10-CM

## 2010-08-06 DIAGNOSIS — Z0000 Encounter for general adult medical examination without abnormal findings: Secondary | ICD-10-CM

## 2010-08-06 LAB — URINALYSIS
Bilirubin Urine: NEGATIVE
Leukocytes, UA: NEGATIVE
Nitrite: NEGATIVE
Urine Glucose: NEGATIVE
Urobilinogen, UA: 0.2 (ref 0.0–1.0)

## 2010-08-06 LAB — CBC WITH DIFFERENTIAL/PLATELET
Basophils Absolute: 0 10*3/uL (ref 0.0–0.1)
Eosinophils Relative: 2.3 % (ref 0.0–5.0)
Hemoglobin: 14.6 g/dL (ref 13.0–17.0)
Lymphocytes Relative: 37.1 % (ref 12.0–46.0)
Lymphs Abs: 2.8 10*3/uL (ref 0.7–4.0)
MCV: 90.1 fl (ref 78.0–100.0)
Monocytes Relative: 9.1 % (ref 3.0–12.0)
Neutrophils Relative %: 51 % (ref 43.0–77.0)
Platelets: 188 10*3/uL (ref 150.0–400.0)
RDW: 12.9 % (ref 11.5–14.6)

## 2010-08-06 LAB — LDL CHOLESTEROL, DIRECT: Direct LDL: 144.4 mg/dL

## 2010-08-06 LAB — BASIC METABOLIC PANEL
BUN: 13 mg/dL (ref 6–23)
CO2: 30 mEq/L (ref 19–32)
Calcium: 9.1 mg/dL (ref 8.4–10.5)
Chloride: 104 mEq/L (ref 96–112)

## 2010-08-06 LAB — LIPID PANEL
Cholesterol: 207 mg/dL — ABNORMAL HIGH (ref 0–200)
Total CHOL/HDL Ratio: 5
VLDL: 39.6 mg/dL (ref 0.0–40.0)

## 2010-08-06 LAB — HEPATIC FUNCTION PANEL
AST: 27 U/L (ref 0–37)
Albumin: 4.4 g/dL (ref 3.5–5.2)
Alkaline Phosphatase: 50 U/L (ref 39–117)
Bilirubin, Direct: 0.2 mg/dL (ref 0.0–0.3)
Total Protein: 7.3 g/dL (ref 6.0–8.3)

## 2010-08-07 ENCOUNTER — Other Ambulatory Visit: Payer: Self-pay

## 2010-08-07 ENCOUNTER — Other Ambulatory Visit: Payer: Self-pay | Admitting: Internal Medicine

## 2010-08-07 DIAGNOSIS — Z0389 Encounter for observation for other suspected diseases and conditions ruled out: Secondary | ICD-10-CM

## 2010-08-07 DIAGNOSIS — Z0000 Encounter for general adult medical examination without abnormal findings: Secondary | ICD-10-CM

## 2010-08-08 ENCOUNTER — Encounter: Payer: Self-pay | Admitting: Internal Medicine

## 2010-08-09 ENCOUNTER — Encounter: Payer: Self-pay | Admitting: Internal Medicine

## 2010-08-09 DIAGNOSIS — Z0001 Encounter for general adult medical examination with abnormal findings: Secondary | ICD-10-CM | POA: Insufficient documentation

## 2010-08-12 ENCOUNTER — Encounter: Payer: Self-pay | Admitting: Internal Medicine

## 2010-08-12 ENCOUNTER — Ambulatory Visit (INDEPENDENT_AMBULATORY_CARE_PROVIDER_SITE_OTHER): Payer: Federal, State, Local not specified - PPO | Admitting: Internal Medicine

## 2010-08-12 VITALS — BP 112/68 | HR 88 | Temp 98.0°F | Ht 66.0 in | Wt 164.5 lb

## 2010-08-12 DIAGNOSIS — G8929 Other chronic pain: Secondary | ICD-10-CM

## 2010-08-12 DIAGNOSIS — M545 Low back pain, unspecified: Secondary | ICD-10-CM | POA: Insufficient documentation

## 2010-08-12 DIAGNOSIS — Z0000 Encounter for general adult medical examination without abnormal findings: Secondary | ICD-10-CM

## 2010-08-12 DIAGNOSIS — F411 Generalized anxiety disorder: Secondary | ICD-10-CM

## 2010-08-12 HISTORY — DX: Low back pain, unspecified: M54.50

## 2010-08-12 MED ORDER — ALPRAZOLAM 0.5 MG PO TABS: 0.2500 mg | ORAL_TABLET | Freq: Every day | ORAL | Status: DC | PRN

## 2010-08-12 NOTE — Patient Instructions (Addendum)
Continue all other medications as before Please follow lower cholesterol diet Please call if you change your mind about having the colonoscopy Please return in 6 months, or sooner if needed

## 2010-08-12 NOTE — Progress Notes (Signed)
Subjective:    Patient ID: Ricky Kelly, male    DOB: 29-Oct-1950, 60 y.o.   MRN: [id number]  HPI Here for wellness and f/u;  Overall doing ok;  Pt denies CP, worsening SOB, DOE, wheezing, orthopnea, PND, worsening LE edema, palpitations, dizziness or syncope.  Pt denies neurological change such as new Headache, facial or extremity weakness.  Pt denies polydipsia, polyuria, or low sugar symptoms. Pt states overall good compliance with treatment and medications, good tolerability, and trying to follow lower cholesterol diet.  Pt denies worsening depressive symptoms, suicidal ideation or panic. No fever, wt loss, night sweats, loss of appetite, or other constitutional symptoms.  Pt states good ability with ADL's, low fall risk, home safety reviewed and adequate, no significant changes in hearing or vision, and occasionally active with exercise.  Unfort had some constipation with the lexapr from last visit for depression dec 2011, so prefers not to take for now. Past Medical History  Diagnosis Date  . Anxiety   . Depression   . Hyperlipidemia   . Adjustment disorder 1/10    with depressive symptoms  . Lumbago   . Hemorrhoids   . Hearing loss     sensorineural  bilateral  . Retinal tear     bilateral no surgery  . Allergic rhinitis   . Psoriasis    Past Surgical History  Procedure Date  . Hemorrhoid surgery     reports that he has never smoked. He does not have any smokeless tobacco history on file. He reports that he does not drink alcohol or use illicit drugs. family history includes Cancer in his father and mother and Diabetes in an unspecified family member. Allergies  Allergen Reactions  . Lexapro     constipation   Current Outpatient Prescriptions on File Prior to Visit  Medication Sig Dispense Refill  . ALPRAZolam (XANAX) 0.5 MG tablet Take 0.25-0.5 mg by mouth daily as needed.        Marland Kitchen aspirin 325 MG buffered tablet Take 325 mg by mouth as needed.        . fexofenadine  (ALLEGRA) 180 MG tablet Take 180 mg by mouth daily.        . fluticasone (FLONASE) 50 MCG/ACT nasal spray Place 2 sprays into the nose daily.        Marland Kitchen ibuprofen (ADVIL,MOTRIN) 200 MG tablet Take 200 mg by mouth as needed.        Marland Kitchen escitalopram (LEXAPRO) 10 MG tablet Take 10 mg by mouth daily.         Review of Systems Review of Systems  Constitutional: Negative for diaphoresis, activity change, appetite change and unexpected weight change.  HENT: Negative for hearing loss, ear pain, facial swelling, mouth sores and neck stiffness.   Eyes: Negative for pain, redness and visual disturbance.  Respiratory: Negative for shortness of breath and wheezing.   Cardiovascular: Negative for chest pain and palpitations.  Gastrointestinal: Negative for diarrhea, blood in stool, abdominal distention and rectal pain.  Genitourinary: Negative for hematuria, flank pain and decreased urine volume.  Musculoskeletal: Negative for myalgias and joint swelling.  Skin: Negative for color change and wound.  Neurological: Negative for syncope and numbness.  Hematological: Negative for adenopathy.  Psychiatric/Behavioral: Negative for hallucinations, self-injury, decreased concentration and agitation.      Objective:   Physical Exam Physical Exam  VS noted Constitutional: Pt is oriented to person, place, and time. Appears well-developed and well-nourished.  HENT:  Head: Normocephalic and atraumatic.  Right  Ear: External ear normal.  Left Ear: External ear normal.  Nose: Nose normal.  Mouth/Throat: Oropharynx is clear and moist.  Eyes: Conjunctivae and EOM are normal. Pupils are equal, round, and reactive to light.  Neck: Normal range of motion. Neck supple. No JVD present. No tracheal deviation present.  Cardiovascular: Normal rate, regular rhythm, normal heart sounds and intact distal pulses.   Pulmonary/Chest: Effort normal and breath sounds normal.  Abdominal: Soft. Bowel sounds are normal. There is no  tenderness.  Musculoskeletal: Normal range of motion. Exhibits no edema.  Lymphadenopathy:  Has no cervical adenopathy. except for one mild 1/2 cm mobile nontender chronic node to post right angle of jaw; Neurological: Pt is alert and oriented to person, place, and time. Pt has normal reflexes. No cranial nerve deficit.  Skin: Skin is warm and dry. No rash noted.  Psychiatric:  Has  normal mood and affect. Behavior is normal. 1+nervous        Assessment & Plan:

## 2010-08-12 NOTE — Assessment & Plan Note (Signed)
stable overall by hx and exam, and pt to continue medical treatment as before, for refills today

## 2010-08-12 NOTE — Assessment & Plan Note (Addendum)
Overall doing well, age appropriate education and counseling updated, referrals for preventative services and immunizations addressed, dietary and smoking counseling addressed, most recent labs and ECG reviewed.  I have personally reviewed and have noted: 1) the patient's medical and social history 2) The pt's use of alcohol, tobacco, and illicit drugs 3) The patient's current medications and supplements 4) Functional ability including ADL's, fall risk, home safety risk, hearing and visual impairment 5) Diet and physical activities 6) Evidence for depression or mood disorder 7) The patient's height, weight, and BMI have been recorded in the chart I have made referrals, and provided counseling and education based on review of the above Declines colonscopy referral

## 2010-09-25 ENCOUNTER — Ambulatory Visit (INDEPENDENT_AMBULATORY_CARE_PROVIDER_SITE_OTHER): Payer: Federal, State, Local not specified - PPO | Admitting: *Deleted

## 2010-09-25 DIAGNOSIS — Z23 Encounter for immunization: Secondary | ICD-10-CM

## 2011-10-24 ENCOUNTER — Other Ambulatory Visit: Payer: Self-pay | Admitting: Internal Medicine

## 2011-10-24 NOTE — Telephone Encounter (Signed)
Done hardcopy to robin  

## 2011-10-24 NOTE — Telephone Encounter (Signed)
Faxed hardcopy to pharmacy. 

## 2011-10-29 ENCOUNTER — Ambulatory Visit: Payer: Federal, State, Local not specified - PPO | Admitting: Internal Medicine

## 2012-07-27 ENCOUNTER — Encounter: Payer: Self-pay | Admitting: Internal Medicine

## 2012-07-27 ENCOUNTER — Ambulatory Visit (INDEPENDENT_AMBULATORY_CARE_PROVIDER_SITE_OTHER): Payer: Medicare Other | Admitting: Internal Medicine

## 2012-07-27 ENCOUNTER — Other Ambulatory Visit: Payer: Self-pay | Admitting: Internal Medicine

## 2012-07-27 ENCOUNTER — Other Ambulatory Visit (INDEPENDENT_AMBULATORY_CARE_PROVIDER_SITE_OTHER): Payer: Medicare Other

## 2012-07-27 VITALS — BP 112/80 | HR 82 | Temp 97.4°F | Ht 66.0 in | Wt 164.5 lb

## 2012-07-27 DIAGNOSIS — M545 Low back pain: Secondary | ICD-10-CM | POA: Diagnosis not present

## 2012-07-27 DIAGNOSIS — N320 Bladder-neck obstruction: Secondary | ICD-10-CM

## 2012-07-27 DIAGNOSIS — M79609 Pain in unspecified limb: Secondary | ICD-10-CM

## 2012-07-27 DIAGNOSIS — E785 Hyperlipidemia, unspecified: Secondary | ICD-10-CM

## 2012-07-27 DIAGNOSIS — F411 Generalized anxiety disorder: Secondary | ICD-10-CM

## 2012-07-27 DIAGNOSIS — F329 Major depressive disorder, single episode, unspecified: Secondary | ICD-10-CM | POA: Diagnosis not present

## 2012-07-27 DIAGNOSIS — Z136 Encounter for screening for cardiovascular disorders: Secondary | ICD-10-CM

## 2012-07-27 DIAGNOSIS — G8929 Other chronic pain: Secondary | ICD-10-CM

## 2012-07-27 DIAGNOSIS — M79642 Pain in left hand: Secondary | ICD-10-CM

## 2012-07-27 LAB — URINALYSIS, ROUTINE W REFLEX MICROSCOPIC
Hgb urine dipstick: NEGATIVE
Ketones, ur: NEGATIVE
Total Protein, Urine: NEGATIVE
Urine Glucose: NEGATIVE

## 2012-07-27 LAB — HEPATIC FUNCTION PANEL
Alkaline Phosphatase: 47 U/L (ref 39–117)
Bilirubin, Direct: 0.3 mg/dL (ref 0.0–0.3)
Total Bilirubin: 1.3 mg/dL — ABNORMAL HIGH (ref 0.3–1.2)

## 2012-07-27 LAB — CBC WITH DIFFERENTIAL/PLATELET
Basophils Relative: 0.6 % (ref 0.0–3.0)
Eosinophils Absolute: 0.2 10*3/uL (ref 0.0–0.7)
HCT: 44 % (ref 39.0–52.0)
Hemoglobin: 14.9 g/dL (ref 13.0–17.0)
Lymphocytes Relative: 33.8 % (ref 12.0–46.0)
Lymphs Abs: 2.8 10*3/uL (ref 0.7–4.0)
MCHC: 34 g/dL (ref 30.0–36.0)
MCV: 90.8 fl (ref 78.0–100.0)
Monocytes Absolute: 0.7 10*3/uL (ref 0.1–1.0)
Neutro Abs: 4.6 10*3/uL (ref 1.4–7.7)
RBC: 4.85 Mil/uL (ref 4.22–5.81)

## 2012-07-27 LAB — BASIC METABOLIC PANEL
Calcium: 9.3 mg/dL (ref 8.4–10.5)
Creatinine, Ser: 0.9 mg/dL (ref 0.4–1.5)
GFR: 88.12 mL/min (ref >60.00)
Sodium: 138 mEq/L (ref 135–145)

## 2012-07-27 LAB — LIPID PANEL
Cholesterol: 214 mg/dL — ABNORMAL HIGH (ref 0–200)
Total CHOL/HDL Ratio: 5
Triglycerides: 154 mg/dL — ABNORMAL HIGH (ref 0.0–149.0)
VLDL: 30.8 mg/dL (ref 0.0–40.0)

## 2012-07-27 LAB — PSA: PSA: 0.87 ng/mL (ref 0.10–4.00)

## 2012-07-27 LAB — LDL CHOLESTEROL, DIRECT: Direct LDL: 158.7 mg/dL

## 2012-07-27 MED ORDER — FLUTICASONE PROPIONATE 50 MCG/ACT NA SUSP: 2.0000 | Freq: Every day | NASAL | Status: DC

## 2012-07-27 MED ORDER — SERTRALINE HCL 100 MG PO TABS: 100.0000 mg | ORAL_TABLET | Freq: Every day | ORAL | Status: DC

## 2012-07-27 MED ORDER — ATORVASTATIN CALCIUM 10 MG PO TABS: 10.0000 mg | ORAL_TABLET | Freq: Every day | ORAL | Status: DC

## 2012-07-27 MED ORDER — NAPROXEN 500 MG PO TABS: 500.0000 mg | ORAL_TABLET | Freq: Two times a day (BID) | ORAL | Status: DC

## 2012-07-27 MED ORDER — ALPRAZOLAM 0.5 MG PO TABS: ORAL_TABLET | ORAL | Status: DC

## 2012-07-27 NOTE — Assessment & Plan Note (Addendum)
stable overall by history and exam, recent data reviewed with pt, and pt to continue medical treatment as before,  to f/u any worsening symptoms or concerns Lab Results  Component Value Date   LDLCALC 126* 05/04/2008   For f/u lab today; ECG reviewed as per emr

## 2012-07-27 NOTE — Progress Notes (Signed)
Subjective:    Patient ID: Ricky Kelly, male    DOB: 1950/09/11, 62 y.o.   MRN: [id number]  HPI  Here for wellness and f/u;  Overall doing ok;  Pt denies CP, worsening SOB, DOE, wheezing, orthopnea, PND, worsening LE edema, palpitations, dizziness or syncope.  Pt denies neurological change such as new headache, facial or extremity weakness.  Pt denies polydipsia, polyuria, or low sugar symptoms. Pt states overall good compliance with treatment and medications, good tolerability, and has been trying to follow lower cholesterol diet.  Pt denies worsening depressive symptoms, suicidal ideation or panic, but has ongoing anxiety and asks for med such as xanax. No fever, night sweats, wt loss, loss of appetite, or other constitutional symptoms.  Pt states good ability with ADL's, has low fall risk, home safety reviewed and adequate, no other significant changes in hearing or vision, and only occasionally active with exercise.  Declines colonoscopy as he does yearly. Has long hx of hemorrhoids since 62yo, refueses any instrumentation. Pt continues to have recurring LBP without change in severity, bowel or bladder change, fever, wt loss,  worsening LE pain/numbness/weakness, gait change or falls, except for occasional right side transient sciatica pains.   Has ongoing problem with making fist with post left hand pain.  Has right heel pain on first waking up, worst first thing in the am, medial aspect.  Past Medical History  Diagnosis Date  . Anxiety   . Depression   . Hyperlipidemia   . Adjustment disorder 1/10    with depressive symptoms  . Lumbago   . Hemorrhoids   . Hearing loss     sensorineural  bilateral  . Retinal tear     bilateral no surgery  . Allergic rhinitis   . Psoriasis   . Chronic low back pain 08/12/2010   Past Surgical History  Procedure Laterality Date  . Hemorrhoid surgery      reports that he has never smoked. He does not have any smokeless tobacco history on file. He  reports that he does not drink alcohol or use illicit drugs. family history includes Cancer in his father and mother and Diabetes in an unspecified family member. Allergies  Allergen Reactions  . Allegra (Fexofenadine Hcl) Other (See Comments)    constipation  . Escitalopram Oxalate     constipation   Current Outpatient Prescriptions on File Prior to Visit  Medication Sig Dispense Refill  . ALPRAZolam (XANAX) 0.5 MG tablet TAKE ONE-HALF TO ONE TABLET BY MOUTH EVERY DAY AS NEEDED  30 tablet  0  . aspirin 325 MG buffered tablet Take 325 mg by mouth as needed.        . Calcitriol (VECTICAL) 3 MCG/GM cream Apply topically at bedtime.        . fexofenadine (ALLEGRA) 180 MG tablet Take 180 mg by mouth daily.        . fluticasone (FLONASE) 50 MCG/ACT nasal spray Place 2 sprays into the nose daily.        Marland Kitchen ibuprofen (ADVIL,MOTRIN) 200 MG tablet Take 200 mg by mouth as needed.        . Loratadine (CLARITIN PO) Take by mouth daily.         No current facility-administered medications on file prior to visit.    Review of Systems  Constitutional: Negative for unexpected weight change, or unusual diaphoresis  HENT: Negative for tinnitus.   Eyes: Negative for photophobia and visual disturbance.  Respiratory: Negative for choking and stridor.  Gastrointestinal: Negative for vomiting and blood in stool.  Genitourinary: Negative for hematuria and decreased urine volume.  Musculoskeletal: Negative for acute joint swelling Skin: Negative for color change and wound.  Neurological: Negative for tremors and numbness other than noted  Psychiatric/Behavioral: Negative for decreased concentration or  hyperactivity.       Objective:   Physical Exam BP 112/80  Pulse 82  Temp(Src) 97.4 F (36.3 C) (Oral)  Ht 5\' 6"  (1.676 m)  Wt 164 lb 8 oz (74.617 kg)  BMI 26.56 kg/m2  SpO2 99% VS noted,  Constitutional: Pt appears well-developed and well-nourished.  HENT: Head: NCAT.  Right Ear: External ear  normal.  Left Ear: External ear normal.  Eyes: Conjunctivae and EOM are normal. Pupils are equal, round, and reactive to light.  Neck: Normal range of motion. Neck supple.  Cardiovascular: Normal rate and regular rhythm.   Pulmonary/Chest: Effort normal and breath sounds normal.  Abd:  Soft, NT, non-distended, + BS Neurological: Pt is alert. Not confused , motor 5/5, sent/dtr intact Left hand with decreased all finger full flexion, also tender medial right plantar heel Skin: Skin is warm. No erythema.  Psychiatric: Pt behavior is normal. Thought content normal.  1-2+ nervous, mild depressed affect    Assessment & Plan:

## 2012-07-27 NOTE — Patient Instructions (Addendum)
Please take all new medication as prescribed - the naproxen for pain, and the generic for zoloft for nerves (to start at HALF pill for 5 days, then Whole pill after that) OK to stop the allegra as you have Please continue all other medications as before, and refills have been done if requested You will be contacted regarding the referral for: hand surgury Please wear a foot brace at night to help with the heel tendonitis Please continue your efforts at being more active, low cholesterol diet, and weight control. You are otherwise up to date with prevention measures today - please call if you change your mind about the colonoscopy Please go to the LAB in the Basement (turn left off the elevator) for the tests to be done today You will be contacted by phone if any changes need to be made immediately.  Otherwise, you will receive a letter about your results with an explanation, but please check with MyChart first.  Please remember to sign up for My Chart if you have not done so, as this will be important to you in the future with finding out test results, communicating by private email, and scheduling acute appointments online when needed.  Please return in 6 months, or sooner if needed

## 2012-07-27 NOTE — Assessment & Plan Note (Addendum)
Ok for zoloft 100 qd, declines psychiatry, verified no SI or HI  Note:  Total time for pt hx, exam, review of record with pt in the room, determination of diagnoses and plan for further eval and tx is > 40 min, with over 50% spent in coordination and counseling of patient

## 2012-07-27 NOTE — Assessment & Plan Note (Signed)
stable overall by history and exam, recent data reviewed with pt, and pt to continue medical treatment as before,  to f/u any worsening symptoms or concerns, for nsaid prn

## 2012-07-27 NOTE — Assessment & Plan Note (Signed)
prob left post hand tenosynovitis  And right heel plantar fasciitis, (mild); for hand surgury eval, but also foot brace at night, and nsaid as above prn

## 2012-07-27 NOTE — Assessment & Plan Note (Signed)
Ok for zoloft as above, also xanax refill prn use

## 2012-07-28 DIAGNOSIS — H2510 Age-related nuclear cataract, unspecified eye: Secondary | ICD-10-CM | POA: Diagnosis not present

## 2012-07-28 DIAGNOSIS — H33199 Other retinoschisis and retinal cysts, unspecified eye: Secondary | ICD-10-CM | POA: Diagnosis not present

## 2012-08-05 DIAGNOSIS — M47812 Spondylosis without myelopathy or radiculopathy, cervical region: Secondary | ICD-10-CM | POA: Diagnosis not present

## 2012-08-05 DIAGNOSIS — G5600 Carpal tunnel syndrome, unspecified upper limb: Secondary | ICD-10-CM | POA: Diagnosis not present

## 2012-08-11 DIAGNOSIS — D234 Other benign neoplasm of skin of scalp and neck: Secondary | ICD-10-CM | POA: Diagnosis not present

## 2012-08-11 DIAGNOSIS — L408 Other psoriasis: Secondary | ICD-10-CM | POA: Diagnosis not present

## 2012-08-11 DIAGNOSIS — L821 Other seborrheic keratosis: Secondary | ICD-10-CM | POA: Diagnosis not present

## 2012-08-19 DIAGNOSIS — M47812 Spondylosis without myelopathy or radiculopathy, cervical region: Secondary | ICD-10-CM | POA: Diagnosis not present

## 2012-08-19 DIAGNOSIS — G5600 Carpal tunnel syndrome, unspecified upper limb: Secondary | ICD-10-CM | POA: Diagnosis not present

## 2012-08-27 DIAGNOSIS — M47812 Spondylosis without myelopathy or radiculopathy, cervical region: Secondary | ICD-10-CM | POA: Diagnosis not present

## 2012-09-08 DIAGNOSIS — H538 Other visual disturbances: Secondary | ICD-10-CM | POA: Diagnosis not present

## 2012-09-08 DIAGNOSIS — H2510 Age-related nuclear cataract, unspecified eye: Secondary | ICD-10-CM | POA: Diagnosis not present

## 2012-09-08 DIAGNOSIS — H33109 Unspecified retinoschisis, unspecified eye: Secondary | ICD-10-CM | POA: Diagnosis not present

## 2012-09-08 DIAGNOSIS — H43399 Other vitreous opacities, unspecified eye: Secondary | ICD-10-CM | POA: Diagnosis not present

## 2012-10-05 ENCOUNTER — Ambulatory Visit: Payer: Medicare Other

## 2012-10-08 DIAGNOSIS — H2510 Age-related nuclear cataract, unspecified eye: Secondary | ICD-10-CM | POA: Diagnosis not present

## 2012-10-20 ENCOUNTER — Encounter: Payer: Self-pay | Admitting: Internal Medicine

## 2012-10-20 ENCOUNTER — Ambulatory Visit (INDEPENDENT_AMBULATORY_CARE_PROVIDER_SITE_OTHER): Payer: Medicare Other | Admitting: Internal Medicine

## 2012-10-20 VITALS — BP 122/80 | HR 79 | Temp 99.8°F | Ht 65.0 in | Wt 164.4 lb

## 2012-10-20 DIAGNOSIS — J309 Allergic rhinitis, unspecified: Secondary | ICD-10-CM

## 2012-10-20 DIAGNOSIS — F329 Major depressive disorder, single episode, unspecified: Secondary | ICD-10-CM | POA: Diagnosis not present

## 2012-10-20 DIAGNOSIS — J019 Acute sinusitis, unspecified: Secondary | ICD-10-CM | POA: Diagnosis not present

## 2012-10-20 DIAGNOSIS — Z23 Encounter for immunization: Secondary | ICD-10-CM

## 2012-10-20 MED ORDER — CEPHALEXIN 500 MG PO CAPS: 500.0000 mg | ORAL_CAPSULE | Freq: Four times a day (QID) | ORAL | Status: DC

## 2012-10-20 NOTE — Addendum Note (Signed)
Addended by: Scharlene Gloss B on: 10/20/2012 11:07 AM   Modules accepted: Orders

## 2012-10-20 NOTE — Patient Instructions (Signed)
Please take all new medication as prescribed - the antibiotic Please continue all other medications as before, and refills have been done if requested. Please have the pharmacy call with any other refills you may need. OK to stay off the claritin for now  Please remember to sign up for My Chart if you have not done so, as this will be important to you in the future with finding out test results, communicating by private email, and scheduling acute appointments online when needed.

## 2012-10-20 NOTE — Assessment & Plan Note (Signed)
stable overall by history and exam, recent data reviewed with pt, and pt to continue medical treatment as before,  to f/u any worsening symptoms or concern Lab Results  Component Value Date   WBC 8.3 07/27/2012   HGB 14.9 07/27/2012   HCT 44.0 07/27/2012   PLT 151.0 07/27/2012   GLUCOSE 95 07/27/2012   CHOL 214* 07/27/2012   TRIG 154.0* 07/27/2012   HDL 43.30 07/27/2012   LDLDIRECT 158.7 07/27/2012   LDLCALC 126* 05/04/2008   ALT 16 07/27/2012   AST 27 07/27/2012   NA 138 07/27/2012   K 4.5 07/27/2012   CL 105 07/27/2012   CREATININE 0.9 07/27/2012   BUN 10 07/27/2012   CO2 28 07/27/2012   TSH 1.32 07/27/2012   PSA 0.87 07/27/2012

## 2012-10-20 NOTE — Progress Notes (Signed)
Subjective:    Patient ID: Ricky Kelly, male    DOB: 1950-05-09, 62 y.o.   MRN: [id number]  HPI   Here with 3 wks acute onset fever, facial pain, pressure, headache, general weakness and malaise, and greenish d/c, with mild ST and cough, but pt denies chest pain, wheezing, increased sob or doe, orthopnea, PND, increased LE swelling, palpitations, dizziness or syncope. Pt denies new neurological symptoms such as new headache, or facial or extremity weakness or numbness  Due for flu shot Denies worsening depressive symptoms, suicidal ideation, or panic Has been off claritin for several months, doing well until this acute illness Past Medical History  Diagnosis Date  . Anxiety   . Depression   . Hyperlipidemia   . Adjustment disorder 1/10    with depressive symptoms  . Lumbago   . Hemorrhoids   . Hearing loss     sensorineural  bilateral  . Retinal tear     bilateral no surgery  . Allergic rhinitis   . Psoriasis   . Chronic low back pain 08/12/2010   Past Surgical History  Procedure Laterality Date  . Hemorrhoid surgery      reports that he has never smoked. He does not have any smokeless tobacco history on file. He reports that he does not drink alcohol or use illicit drugs. family history includes Cancer in his father and mother; Diabetes in an other family member. Allergies  Allergen Reactions  . Allegra [Fexofenadine Hcl] Other (See Comments)    constipation  . Escitalopram Oxalate     constipation  . Naproxen     constipation   Current Outpatient Prescriptions on File Prior to Visit  Medication Sig Dispense Refill  . ALPRAZolam (XANAX) 0.5 MG tablet TAKE ONE-HALF TO ONE TABLET BY MOUTH EVERY DAY AS NEEDED  30 tablet  5  . aspirin 325 MG buffered tablet Take 325 mg by mouth as needed.        . Calcitriol (VECTICAL) 3 MCG/GM cream Apply topically at bedtime.        . fluticasone (FLONASE) 50 MCG/ACT nasal spray Place 2 sprays into the nose daily.  16 g  3  .  ibuprofen (ADVIL,MOTRIN) 200 MG tablet Take 200 mg by mouth as needed.         No current facility-administered medications on file prior to visit.   Review of Systems  Constitutional: Negative for unexpected weight change, or unusual diaphoresis  HENT: Negative for tinnitus.   Eyes: Negative for photophobia and visual disturbance.  Respiratory: Negative for choking and stridor.   Gastrointestinal: Negative for vomiting and blood in stool.  Genitourinary: Negative for hematuria and decreased urine volume.  Musculoskeletal: Negative for acute joint swelling Skin: Negative for color change and wound.  Neurological: Negative for tremors and numbness other than noted  Psychiatric/Behavioral: Negative for decreased concentration or  hyperactivity.  '    Objective:   Physical Exam BP 122/80  Pulse 79  Temp(Src) 99.8 F (37.7 C) (Oral)  Ht 5\' 5"  (1.651 m)  Wt 164 lb 6 oz (74.56 kg)  BMI 27.35 kg/m2  SpO2 97% VS noted, mild ill Constitutional: Pt appears well-developed and well-nourished.  HENT: Head: NCAT.  Right Ear: External ear normal.  Left Ear: External ear normal.  Bilat tm's with mild erythema.  Max sinus areas mild tender.  Pharynx with mild erythema, no exudate Eyes: Conjunctivae and EOM are normal. Pupils are equal, round, and reactive to light.  Neck: Normal range of  motion. Neck supple.  Cardiovascular: Normal rate and regular rhythm.   Pulmonary/Chest: Effort normal and breath sounds normal.  Neurological: Pt is alert. Not confused  Skin: Skin is warm. No erythema.  Psychiatric: Pt behavior is normal. Thought content normal.     Assessment & Plan:

## 2012-10-20 NOTE — Assessment & Plan Note (Signed)
Mild to mod, for antibx course,  to f/u any worsening symptoms or concerns 

## 2012-10-20 NOTE — Assessment & Plan Note (Signed)
Stable off claritin since last visit,  to f/u any worsening symptoms or concerns

## 2012-10-27 ENCOUNTER — Encounter: Payer: Self-pay | Admitting: Internal Medicine

## 2012-10-27 ENCOUNTER — Ambulatory Visit (INDEPENDENT_AMBULATORY_CARE_PROVIDER_SITE_OTHER): Payer: Medicare Other | Admitting: Internal Medicine

## 2012-10-27 VITALS — BP 104/80 | HR 71 | Temp 97.5°F | Ht 66.0 in | Wt 167.0 lb

## 2012-10-27 DIAGNOSIS — H6980 Other specified disorders of Eustachian tube, unspecified ear: Secondary | ICD-10-CM | POA: Diagnosis not present

## 2012-10-27 DIAGNOSIS — J309 Allergic rhinitis, unspecified: Secondary | ICD-10-CM | POA: Diagnosis not present

## 2012-10-27 DIAGNOSIS — J019 Acute sinusitis, unspecified: Secondary | ICD-10-CM | POA: Diagnosis not present

## 2012-10-27 DIAGNOSIS — H6983 Other specified disorders of Eustachian tube, bilateral: Secondary | ICD-10-CM

## 2012-10-27 NOTE — Progress Notes (Signed)
Subjective:    Patient ID: Ricky Kelly, male    DOB: [birth year], 62 y.o.   MRN: [id number]  HPI  Here to f/u, fever and gland swelling, pain has improved, but still with signficant ear congestion and popping ongoing, which bothers him, at day #8 antibx.  No HA, ST, sinus pain, fever, cough and Pt denies chest pain, increased sob or doe, wheezing, orthopnea, PND, increased LE swelling, palpitations, dizziness or syncope.  Does have several wks ongoing nasal allergy symptoms with clearish congestion, itch and sneezing, without fever, pain, ST, cough, swelling or wheezing; has not been using his flonase recently. Denies worsening depressive symptoms, suicidal ideation, or panic Past Medical History  Diagnosis Date  . Anxiety   . Depression   . Hyperlipidemia   . Adjustment disorder 1/10    with depressive symptoms  . Lumbago   . Hemorrhoids   . Hearing loss     sensorineural  bilateral  . Retinal tear     bilateral no surgery  . Allergic rhinitis   . Psoriasis   . Chronic low back pain 08/12/2010   Past Surgical History  Procedure Laterality Date  . Hemorrhoid surgery      reports that he has never smoked. He does not have any smokeless tobacco history on file. He reports that he does not drink alcohol or use illicit drugs. family history includes Cancer in his father and mother; Diabetes in an other family member. Allergies  Allergen Reactions  . Allegra [Fexofenadine Hcl] Other (See Comments)    constipation  . Escitalopram Oxalate     constipation  . Naproxen     constipation   Current Outpatient Prescriptions on File Prior to Visit  Medication Sig Dispense Refill  . ALPRAZolam (XANAX) 0.5 MG tablet TAKE ONE-HALF TO ONE TABLET BY MOUTH EVERY DAY AS NEEDED  30 tablet  5  . aspirin 325 MG buffered tablet Take 325 mg by mouth as needed.        . Calcitriol (VECTICAL) 3 MCG/GM cream Apply topically at bedtime.        . cephALEXin (KEFLEX) 500 MG capsule Take 1 capsule  (500 mg total) by mouth 4 (four) times daily.  40 capsule  0  . fluticasone (FLONASE) 50 MCG/ACT nasal spray Place 2 sprays into the nose daily.  16 g  3  . ibuprofen (ADVIL,MOTRIN) 200 MG tablet Take 200 mg by mouth as needed.         No current facility-administered medications on file prior to visit.   Review of Systems  Constitutional: Negative for unexpected weight change, or unusual diaphoresis  HENT: Negative for tinnitus.   Eyes: Negative for photophobia and visual disturbance.  Respiratory: Negative for choking and stridor.   Gastrointestinal: Negative for vomiting and blood in stool.  Genitourinary: Negative for hematuria and decreased urine volume.  Musculoskeletal: Negative for acute joint swelling Skin: Negative for color change and wound.  Neurological: Negative for tremors and numbness other than noted  Psychiatric/Behavioral: Negative for decreased concentration or  hyperactivity.       Objective:   Physical Exam BP 104/80  Pulse 71  Temp(Src) 97.5 F (36.4 C) (Oral)  Ht 5\' 6"  (1.676 m)  Wt 167 lb (75.751 kg)  BMI 26.97 kg/m2  SpO2 97% VS noted,  Constitutional: Pt appears well-developed and well-nourished.  HENT: Head: NCAT.  Right Ear: External ear normal.  Left Ear: External ear normal.  Bilat tm's with mild erythema.  Max sinus areas  tender.  Pharynx without erythema, no exudate Eyes: Conjunctivae and EOM are normal. Pupils are equal, round, and reactive to light.  Neck: Normal range of motion. Neck supple. No neck LA Cardiovascular: Normal rate and regular rhythm.   Pulmonary/Chest: Effort normal and breath sounds normal.  Neurological: Pt is alert. Not confused  Skin: Skin is warm. No erythema.  Psychiatric: Pt behavior is normal. Thought content normal.     Assessment & Plan:

## 2012-10-27 NOTE — Assessment & Plan Note (Signed)
To re-start the flonase asd,  to f/u any worsening symptoms or concerns  

## 2012-10-27 NOTE — Assessment & Plan Note (Signed)
Overall improed, ok to finish the antibx

## 2012-10-27 NOTE — Assessment & Plan Note (Signed)
For mucinex otc prn,  to f/u any worsening symptoms or concerns  

## 2012-10-27 NOTE — Patient Instructions (Signed)
OK to finish your current antibiotic Please continue all other medications as before, and refills have been done if requested. You can also take Mucinex (or it's generic off brand) for ear and sinus congestion, and tylenol as needed for pain. Please continue all other medications as before, including the flonase  Please remember to sign up for My Chart if you have not done so, as this will be important to you in the future with finding out test results, communicating by private email, and scheduling acute appointments online when needed.

## 2012-12-07 DIAGNOSIS — H01009 Unspecified blepharitis unspecified eye, unspecified eyelid: Secondary | ICD-10-CM | POA: Diagnosis not present

## 2012-12-07 DIAGNOSIS — IMO0002 Reserved for concepts with insufficient information to code with codable children: Secondary | ICD-10-CM | POA: Diagnosis not present

## 2012-12-07 DIAGNOSIS — H524 Presbyopia: Secondary | ICD-10-CM | POA: Diagnosis not present

## 2012-12-07 DIAGNOSIS — H5231 Anisometropia: Secondary | ICD-10-CM | POA: Diagnosis not present

## 2012-12-07 DIAGNOSIS — H35379 Puckering of macula, unspecified eye: Secondary | ICD-10-CM | POA: Diagnosis not present

## 2012-12-07 DIAGNOSIS — H2510 Age-related nuclear cataract, unspecified eye: Secondary | ICD-10-CM | POA: Diagnosis not present

## 2012-12-07 DIAGNOSIS — H43819 Vitreous degeneration, unspecified eye: Secondary | ICD-10-CM | POA: Diagnosis not present

## 2012-12-21 DIAGNOSIS — H269 Unspecified cataract: Secondary | ICD-10-CM | POA: Diagnosis not present

## 2012-12-21 DIAGNOSIS — H2510 Age-related nuclear cataract, unspecified eye: Secondary | ICD-10-CM | POA: Diagnosis not present

## 2013-01-11 DIAGNOSIS — L2089 Other atopic dermatitis: Secondary | ICD-10-CM | POA: Diagnosis not present

## 2013-02-02 ENCOUNTER — Ambulatory Visit (INDEPENDENT_AMBULATORY_CARE_PROVIDER_SITE_OTHER): Payer: Medicare Other | Admitting: Internal Medicine

## 2013-02-02 ENCOUNTER — Encounter: Payer: Self-pay | Admitting: Internal Medicine

## 2013-02-02 VITALS — BP 110/78 | HR 82 | Temp 97.5°F | Ht 65.0 in | Wt 169.0 lb

## 2013-02-02 DIAGNOSIS — E785 Hyperlipidemia, unspecified: Secondary | ICD-10-CM

## 2013-02-02 DIAGNOSIS — H5000 Unspecified esotropia: Secondary | ICD-10-CM | POA: Diagnosis not present

## 2013-02-02 DIAGNOSIS — G8929 Other chronic pain: Secondary | ICD-10-CM

## 2013-02-02 DIAGNOSIS — K649 Unspecified hemorrhoids: Secondary | ICD-10-CM

## 2013-02-02 DIAGNOSIS — M545 Low back pain, unspecified: Secondary | ICD-10-CM | POA: Diagnosis not present

## 2013-02-02 DIAGNOSIS — M5412 Radiculopathy, cervical region: Secondary | ICD-10-CM | POA: Insufficient documentation

## 2013-02-02 DIAGNOSIS — F411 Generalized anxiety disorder: Secondary | ICD-10-CM

## 2013-02-02 HISTORY — DX: Radiculopathy, cervical region: M54.12

## 2013-02-02 MED ORDER — DULOXETINE HCL 60 MG PO CPEP: 60.0000 mg | ORAL_CAPSULE | Freq: Every day | ORAL | Status: DC

## 2013-02-02 MED ORDER — DULOXETINE HCL 30 MG PO CPEP: 30.0000 mg | ORAL_CAPSULE | Freq: Every day | ORAL | Status: DC

## 2013-02-02 MED ORDER — ALPRAZOLAM 0.5 MG PO TABS: ORAL_TABLET | ORAL | Status: DC

## 2013-02-02 NOTE — Patient Instructions (Signed)
Please take all new medication as prescribed  - the cymbalta at 30 mg per day for 1 wk, then 60 mg per day after that (to start lower to avoid nausea) Please continue all other medications as before, and refills have been done if requested - the xanax Please have the pharmacy call with any other refills you may need.  Please remember to sign up for My Chart if you have not done so, as this will be important to you in the future with finding out test results, communicating by private email, and scheduling acute appointments online when needed.  Please return in 6 months, or sooner if needed

## 2013-02-02 NOTE — Assessment & Plan Note (Signed)
Recurrent, declines referall, and colonscopy

## 2013-02-02 NOTE — Assessment & Plan Note (Signed)
Declines statin as may exac his constipation, understands risk/benefit

## 2013-02-02 NOTE — Progress Notes (Signed)
Subjective:    Patient ID: Ricky Kelly, male    DOB: May 13, 1950, 63 y.o.   MRN: [id number]  HPI  Here to f/u; overall doing ok,  Pt denies chest pain, increased sob or doe, wheezing, orthopnea, PND, increased LE swelling, palpitations, dizziness or syncope.  Pt denies polydipsia, polyuria, or low sugar symptoms such as weakness or confusion improved with po intake.  Pt denies new neurological symptoms such as new headache, or facial or extremity weakness or numbness.   Pt states overall good compliance with meds, has been trying to follow lower cholesterol diet, with wt overall stable,  but little exercise however. Has declined to take lipitor since last visit as it might cause constipation of which he already has recurring hemorrhoid.  Pt continues to have recurring LBP without change in severity, bowel or bladder change, fever, wt loss,  worsening LE pain/numbness/weakness, gait change or falls. Still with chronic insomnia related, and cant sit long/cant stand llong S/p eye surgury x 2 in 2014. Did see hand surgury with decr grip strength, then to spine surgeon (not sure who) who found bone spurs as etiology, rec'd surgury, but pt declined.   Still with marked bilat HOH. Due for colonsocpy - cont's to decline.  Denies worsening depressive symptoms, suicidal ideation, or panic; has ongoing anxiety, needs xanax refill Past Medical History  Diagnosis Date  . Anxiety   . Depression   . Hyperlipidemia   . Adjustment disorder 1/10    with depressive symptoms  . Lumbago   . Hemorrhoids   . Hearing loss     sensorineural  bilateral  . Retinal tear     bilateral no surgery  . Allergic rhinitis   . Psoriasis   . Chronic low back pain 08/12/2010  . Chronic cervical radiculopathy 02/02/2013   Past Surgical History  Procedure Laterality Date  . Hemorrhoid surgery      reports that he has never smoked. He does not have any smokeless tobacco history on file. He reports that he does not drink  alcohol or use illicit drugs. family history includes Cancer in his father and mother; Diabetes in an other family member. Allergies  Allergen Reactions  . Allegra [Fexofenadine Hcl] Other (See Comments)    constipation  . Escitalopram Oxalate     constipation  . Naproxen     constipation   Current Outpatient Prescriptions on File Prior to Visit  Medication Sig Dispense Refill  . ALPRAZolam (XANAX) 0.5 MG tablet TAKE ONE-HALF TO ONE TABLET BY MOUTH EVERY DAY AS NEEDED  30 tablet  5  . aspirin 325 MG buffered tablet Take 325 mg by mouth as needed.        . Calcitriol (VECTICAL) 3 MCG/GM cream Apply topically at bedtime.        . fluticasone (FLONASE) 50 MCG/ACT nasal spray Place 2 sprays into the nose daily.  16 g  3  . ibuprofen (ADVIL,MOTRIN) 200 MG tablet Take 200 mg by mouth as needed.         No current facility-administered medications on file prior to visit.    Review of Systems  Constitutional: Negative for unexpected weight change, or unusual diaphoresis  HENT: Negative for tinnitus.   Eyes: Negative for photophobia and visual disturbance.  Respiratory: Negative for choking and stridor.   Gastrointestinal: Negative for vomiting and blood in stool.  Genitourinary: Negative for hematuria and decreased urine volume.  Musculoskeletal: Negative for acute joint swelling Skin: Negative for color change  and wound.  Neurological: Negative for tremors and numbness other than noted  Psychiatric/Behavioral: Negative for decreased concentration or  hyperactivity.       Objective:   Physical Exam BP 110/78  Pulse 82  Temp(Src) 97.5 F (36.4 C) (Oral)  Ht 5\' 5"  (1.651 m)  Wt 169 lb (76.658 kg)  BMI 28.12 kg/m2  SpO2 97% VS noted, chronic pain, has to change position - stand/sit every 3 min or so Constitutional: Pt appears well-developed and well-nourished.  HENT: Head: NCAT.  Right Ear: External ear normal.  Left Ear: External ear normal.  Eyes: Conjunctivae and EOM are  normal. Pupils are equal, round, and reactive to light.  Neck: Normal range of motion. Neck supple.  Cardiovascular: Normal rate and regular rhythm.   Pulmonary/Chest: Effort normal and breath sounds normal.  Abd:  Soft, NT, non-distended, + BS Neurological: Pt is alert. Not confused  Skin: Skin is warm. No erythema.  Psychiatric: Pt behavior is normal. Thought content normal. 1+ nervous    Assessment & Plan:

## 2013-02-02 NOTE — Assessment & Plan Note (Signed)
Declines referral for pain management, ok for cymbalta trial 30 qd for 1 wk, then 60 qd after that

## 2013-02-02 NOTE — Assessment & Plan Note (Signed)
stable overall by history and exam, recent data reviewed with pt, and pt to continue medical treatment as before,  to f/u any worsening symptoms or concerns Lab Results  Component Value Date   WBC 8.3 07/27/2012   HGB 14.9 07/27/2012   HCT 44.0 07/27/2012   PLT 151.0 07/27/2012   GLUCOSE 95 07/27/2012   CHOL 214* 07/27/2012   TRIG 154.0* 07/27/2012   HDL 43.30 07/27/2012   LDLDIRECT 158.7 07/27/2012   LDLCALC 126* 05/04/2008   ALT 16 07/27/2012   AST 27 07/27/2012   NA 138 07/27/2012   K 4.5 07/27/2012   CL 105 07/27/2012   CREATININE 0.9 07/27/2012   BUN 10 07/27/2012   CO2 28 07/27/2012   TSH 1.32 07/27/2012   PSA 0.87 07/27/2012

## 2013-02-02 NOTE — Progress Notes (Signed)
Pre-visit discussion using our clinic review tool. No additional management support is needed unless otherwise documented below in the visit note.  

## 2013-03-01 DIAGNOSIS — H2510 Age-related nuclear cataract, unspecified eye: Secondary | ICD-10-CM | POA: Diagnosis not present

## 2013-03-01 DIAGNOSIS — H01009 Unspecified blepharitis unspecified eye, unspecified eyelid: Secondary | ICD-10-CM | POA: Diagnosis not present

## 2013-03-01 DIAGNOSIS — H26499 Other secondary cataract, unspecified eye: Secondary | ICD-10-CM | POA: Diagnosis not present

## 2013-03-01 DIAGNOSIS — Z961 Presence of intraocular lens: Secondary | ICD-10-CM | POA: Diagnosis not present

## 2013-04-08 DIAGNOSIS — L2089 Other atopic dermatitis: Secondary | ICD-10-CM | POA: Diagnosis not present

## 2013-04-08 DIAGNOSIS — L608 Other nail disorders: Secondary | ICD-10-CM | POA: Diagnosis not present

## 2013-04-11 DIAGNOSIS — H35379 Puckering of macula, unspecified eye: Secondary | ICD-10-CM | POA: Diagnosis not present

## 2013-04-11 DIAGNOSIS — H26499 Other secondary cataract, unspecified eye: Secondary | ICD-10-CM | POA: Diagnosis not present

## 2013-04-11 DIAGNOSIS — Z961 Presence of intraocular lens: Secondary | ICD-10-CM | POA: Diagnosis not present

## 2013-04-11 DIAGNOSIS — H01009 Unspecified blepharitis unspecified eye, unspecified eyelid: Secondary | ICD-10-CM | POA: Diagnosis not present

## 2013-06-23 DIAGNOSIS — H26499 Other secondary cataract, unspecified eye: Secondary | ICD-10-CM | POA: Diagnosis not present

## 2013-06-23 DIAGNOSIS — H01009 Unspecified blepharitis unspecified eye, unspecified eyelid: Secondary | ICD-10-CM | POA: Diagnosis not present

## 2013-06-23 DIAGNOSIS — Z961 Presence of intraocular lens: Secondary | ICD-10-CM | POA: Diagnosis not present

## 2013-06-23 DIAGNOSIS — H18519 Endothelial corneal dystrophy, unspecified eye: Secondary | ICD-10-CM | POA: Diagnosis not present

## 2013-08-03 ENCOUNTER — Ambulatory Visit: Payer: Medicare Other | Admitting: Internal Medicine

## 2013-08-10 ENCOUNTER — Other Ambulatory Visit (INDEPENDENT_AMBULATORY_CARE_PROVIDER_SITE_OTHER): Payer: Medicare Other

## 2013-08-10 ENCOUNTER — Ambulatory Visit (INDEPENDENT_AMBULATORY_CARE_PROVIDER_SITE_OTHER): Payer: Medicare Other | Admitting: Internal Medicine

## 2013-08-10 ENCOUNTER — Encounter: Payer: Self-pay | Admitting: Internal Medicine

## 2013-08-10 VITALS — BP 112/72 | HR 90 | Temp 98.0°F | Ht 65.0 in | Wt 168.2 lb

## 2013-08-10 DIAGNOSIS — E785 Hyperlipidemia, unspecified: Secondary | ICD-10-CM | POA: Diagnosis not present

## 2013-08-10 DIAGNOSIS — M79642 Pain in left hand: Secondary | ICD-10-CM

## 2013-08-10 DIAGNOSIS — F411 Generalized anxiety disorder: Secondary | ICD-10-CM

## 2013-08-10 DIAGNOSIS — M79643 Pain in unspecified hand: Secondary | ICD-10-CM | POA: Insufficient documentation

## 2013-08-10 DIAGNOSIS — M545 Low back pain, unspecified: Secondary | ICD-10-CM

## 2013-08-10 DIAGNOSIS — N320 Bladder-neck obstruction: Secondary | ICD-10-CM | POA: Diagnosis not present

## 2013-08-10 DIAGNOSIS — M79641 Pain in right hand: Secondary | ICD-10-CM

## 2013-08-10 DIAGNOSIS — G8929 Other chronic pain: Secondary | ICD-10-CM

## 2013-08-10 DIAGNOSIS — M79609 Pain in unspecified limb: Secondary | ICD-10-CM

## 2013-08-10 DIAGNOSIS — G4700 Insomnia, unspecified: Secondary | ICD-10-CM

## 2013-08-10 LAB — BASIC METABOLIC PANEL
BUN: 15 mg/dL (ref 6–23)
CO2: 33 mEq/L — ABNORMAL HIGH (ref 19–32)
Calcium: 9.8 mg/dL (ref 8.4–10.5)
Chloride: 100 mEq/L (ref 96–112)
Creatinine, Ser: 1 mg/dL (ref 0.4–1.5)
GFR: 79.84 mL/min (ref >60.00)
Glucose, Bld: 87 mg/dL (ref 70–99)
Potassium: 4.3 mEq/L (ref 3.5–5.1)
Sodium: 139 mEq/L (ref 135–145)

## 2013-08-10 LAB — TSH: TSH: 3.49 u[IU]/mL (ref 0.35–4.50)

## 2013-08-10 LAB — CBC WITH DIFFERENTIAL/PLATELET
BASOS ABS: 0 10*3/uL (ref 0.0–0.1)
Basophils Relative: 0.2 % (ref 0.0–3.0)
Eosinophils Absolute: 0.2 10*3/uL (ref 0.0–0.7)
Eosinophils Relative: 1.9 % (ref 0.0–5.0)
HEMATOCRIT: 44.5 % (ref 39.0–52.0)
HEMOGLOBIN: 15 g/dL (ref 13.0–17.0)
LYMPHS ABS: 3 10*3/uL (ref 0.7–4.0)
Lymphocytes Relative: 37.7 % (ref 12.0–46.0)
MCHC: 33.7 g/dL (ref 30.0–36.0)
MCV: 89.7 fl (ref 78.0–100.0)
MONO ABS: 0.8 10*3/uL (ref 0.1–1.0)
Monocytes Relative: 10 % (ref 3.0–12.0)
Neutro Abs: 4 10*3/uL (ref 1.4–7.7)
Neutrophils Relative %: 50.2 % (ref 43.0–77.0)
Platelets: 208 10*3/uL (ref 150.0–400.0)
RBC: 4.96 Mil/uL (ref 4.22–5.81)
RDW: 13.2 % (ref 11.5–15.5)
WBC: 7.9 10*3/uL (ref 4.0–10.5)

## 2013-08-10 LAB — HEPATIC FUNCTION PANEL
ALBUMIN: 4.3 g/dL (ref 3.5–5.2)
ALT: 30 U/L (ref 0–53)
AST: 30 U/L (ref 0–37)
Alkaline Phosphatase: 47 U/L (ref 39–117)
BILIRUBIN TOTAL: 0.7 mg/dL (ref 0.2–1.2)
Bilirubin, Direct: 0.1 mg/dL (ref 0.0–0.3)
Total Protein: 7.5 g/dL (ref 6.0–8.3)

## 2013-08-10 LAB — URINALYSIS, ROUTINE W REFLEX MICROSCOPIC
BILIRUBIN URINE: NEGATIVE
HGB URINE DIPSTICK: NEGATIVE
Ketones, ur: NEGATIVE
LEUKOCYTES UA: NEGATIVE
NITRITE: NEGATIVE
Specific Gravity, Urine: 1.01 (ref 1.000–1.030)
Total Protein, Urine: NEGATIVE
Urine Glucose: NEGATIVE
Urobilinogen, UA: 0.2 (ref 0.0–1.0)
pH: 7 (ref 5.0–8.0)

## 2013-08-10 LAB — LIPID PANEL
CHOL/HDL RATIO: 5
CHOLESTEROL: 245 mg/dL — AB (ref 0–200)
HDL: 47 mg/dL (ref >39.00)
NonHDL: 198
Triglycerides: 221 mg/dL — ABNORMAL HIGH (ref 0.0–149.0)
VLDL: 44.2 mg/dL — AB (ref 0.0–40.0)

## 2013-08-10 LAB — SEDIMENTATION RATE: Sed Rate: 19 mm/hr (ref 0–22)

## 2013-08-10 LAB — RHEUMATOID FACTOR: Rhuematoid fact SerPl-aCnc: <10 IU/mL (ref <=14)

## 2013-08-10 LAB — PSA: PSA: 0.86 ng/mL (ref 0.10–4.00)

## 2013-08-10 LAB — LDL CHOLESTEROL, DIRECT: Direct LDL: 182.7 mg/dL

## 2013-08-10 MED ORDER — ALPRAZOLAM 0.5 MG PO TABS: ORAL_TABLET | ORAL | Status: DC

## 2013-08-10 MED ORDER — ZOLPIDEM TARTRATE 5 MG PO TABS: 5.0000 mg | ORAL_TABLET | Freq: Every evening | ORAL | Status: DC | PRN

## 2013-08-10 NOTE — Progress Notes (Signed)
Subjective:    Patient ID: Ricky Kelly, male    DOB: 06-21-1950, 63 y.o.   MRN: [id number]  HPI  Here for yearly f/u;  Overall doing ok;  Pt denies CP, worsening SOB, DOE, wheezing, orthopnea, PND, worsening LE edema, palpitations, dizziness or syncope.  Pt denies neurological change such as new headache, facial or extremity weakness.  Pt denies polydipsia, polyuria, or low sugar symptoms. Pt states overall good compliance with treatment and medications, good tolerability, and has been trying to follow lower cholesterol diet.  Pt denies worsening depressive symptoms, suicidal ideation or panic. No fever, night sweats, wt loss, loss of appetite, or other constitutional symptoms.  Pt states good ability with ADL's, has low fall risk, home safety reviewed and adequate, no other significant changes in hearing or vision, and only occasionally active with exercise . Still walks with cane. No recent falls. Cont's to defere colonoscopy and does not care for statin trial due to risk of constiopation, an ongoing problem in the past. Denies worsening depressive symptoms, suicidal ideation, or panic; has ongoing anxiety.  Pt continues to have recurring LBP without change in severity, bowel or bladder change, fever, wt loss,  worsening LE pain/numbness/weakness, gait change or falls.  Has not seen ortho for 5 yrs.  Still with right hand > left hand pain with AM swelling to right hand with decreased ROM DIP/PIP - unable to make a fist.  Also problem with sleeping, only slept 3 hrs, back pain , hand pain sometimes wakes him up.  Has hx of chronic cervical radiculopathy Past Medical History  Diagnosis Date  . Anxiety   . Depression   . Hyperlipidemia   . Adjustment disorder 1/10    with depressive symptoms  . Lumbago   . Hemorrhoids   . Hearing loss     sensorineural  bilateral  . Retinal tear     bilateral no surgery  . Allergic rhinitis   . Psoriasis   . Chronic low back pain 08/12/2010  . Chronic  cervical radiculopathy 02/02/2013   Past Surgical History  Procedure Laterality Date  . Hemorrhoid surgery      reports that he has never smoked. He does not have any smokeless tobacco history on file. He reports that he does not drink alcohol or use illicit drugs. family history includes Cancer in his father and mother; Diabetes in an other family member. Allergies  Allergen Reactions  . Allegra [Fexofenadine Hcl] Other (See Comments)    constipation  . Escitalopram Oxalate     constipation  . Naproxen     constipation   Current Outpatient Prescriptions on File Prior to Visit  Medication Sig Dispense Refill  . ALPRAZolam (XANAX) 0.5 MG tablet TAKE ONE-HALF TO ONE TABLET BY MOUTH EVERY DAY AS NEEDED  30 tablet  5  . aspirin 325 MG buffered tablet Take 325 mg by mouth as needed.        . Calcitriol (VECTICAL) 3 MCG/GM cream Apply topically at bedtime.        . fluticasone (FLONASE) 50 MCG/ACT nasal spray Place 2 sprays into the nose daily.  16 g  3  . ibuprofen (ADVIL,MOTRIN) 200 MG tablet Take 200 mg by mouth as needed.         No current facility-administered medications on file prior to visit.   Review of Systems  Constitutional: Negative for unusual diaphoresis or other sweats  HENT: Negative for ringing in ear Eyes: Negative for double vision or worsening  visual disturbance.  Respiratory: Negative for choking and stridor.   Gastrointestinal: Negative for vomiting or other signifcant bowel change Genitourinary: Negative for hematuria or decreased urine volume.  Musculoskeletal: Negative for other MSK pain or swelling Skin: Negative for color change and worsening wound.  Neurological: Negative for tremors and numbness other than noted  Psychiatric/Behavioral: Negative for decreased concentration or agitation other than above       Objective:   Physical Exam BP 112/72  Pulse 90  Temp(Src) 98 F (36.7 C) (Oral)  Ht 5\' 5"  (1.651 m)  Wt 168 lb 4 oz (76.318 kg)  BMI 28.00  kg/m2  SpO2 94% VS noted,  Constitutional: Pt appears well-developed, well-nourished.  HENT: Head: NCAT.  Right Ear: External ear normal.  Left Ear: External ear normal.  Eyes: . Pupils are equal, round, and reactive to light. Conjunctivae and EOM are normal Neck: Normal range of motion. Neck supple.  Cardiovascular: Normal rate and regular rhythm.   Pulmonary/Chest: Effort normal and breath sounds normal.  Abd:  Soft, NT, ND, + BS Neurological: Pt is alert. Not confused , motor grossly intact Skin: Skin is warm. No rash Psychiatric: Pt behavior is normal. No agitation.     Assessment & Plan:

## 2013-08-10 NOTE — Patient Instructions (Signed)
Please take all new medication as prescribed  - the ambien for sleep  Please continue all other medications as before, and refills have been done if requested.  Please have the pharmacy call with any other refills you may need.  Please continue your efforts at being more active, low cholesterol diet, and weight control.  You are otherwise up to date with prevention measures today.  Please keep your appointments with your specialists as you may have planned  You will be contacted regarding the referral for: Dr Tamala Julian - sports medicine  Please go to the LAB in the Basement (turn left off the elevator) for the tests to be done today  You will be contacted by phone if any changes need to be made immediately.  Otherwise, you will receive a letter about your results with an explanation, but please check with MyChart first.  Please remember to sign up for MyChart if you have not done so, as this will be important to you in the future with finding out test results, communicating by private email, and scheduling acute appointments online when needed.  Please return in 6 months, or sooner if needed

## 2013-08-10 NOTE — Assessment & Plan Note (Signed)
Appears benign exam, for esr, rf

## 2013-08-10 NOTE — Assessment & Plan Note (Signed)
Stable, no surgical exam in 5 yrs, can only sit for 5 min, not wanting pain medication, for Dr Tamala Julian referral/sport med

## 2013-08-10 NOTE — Assessment & Plan Note (Signed)
For ambien prn,  to f/u any worsening symptoms or concerns

## 2013-08-10 NOTE — Progress Notes (Signed)
Pre visit review using our clinic review tool, if applicable. No additional management support is needed unless otherwise documented below in the visit note. 

## 2013-08-11 ENCOUNTER — Encounter: Payer: Self-pay | Admitting: Internal Medicine

## 2013-08-11 ENCOUNTER — Other Ambulatory Visit: Payer: Self-pay | Admitting: Internal Medicine

## 2013-08-11 LAB — ANA: Anti Nuclear Antibody(ANA): NEGATIVE

## 2013-08-11 MED ORDER — ATORVASTATIN CALCIUM 20 MG PO TABS: 20.0000 mg | ORAL_TABLET | Freq: Every day | ORAL | Status: DC

## 2013-09-01 ENCOUNTER — Ambulatory Visit: Payer: Medicare Other | Admitting: Family Medicine

## 2013-09-07 ENCOUNTER — Ambulatory Visit (INDEPENDENT_AMBULATORY_CARE_PROVIDER_SITE_OTHER): Payer: Medicare Other | Admitting: Family Medicine

## 2013-09-07 ENCOUNTER — Encounter: Payer: Self-pay | Admitting: Family Medicine

## 2013-09-07 VITALS — BP 100/78 | HR 97 | Ht 65.0 in | Wt 171.0 lb

## 2013-09-07 DIAGNOSIS — M2550 Pain in unspecified joint: Secondary | ICD-10-CM | POA: Insufficient documentation

## 2013-09-07 MED ORDER — GABAPENTIN 100 MG PO CAPS: 100.0000 mg | ORAL_CAPSULE | Freq: Every day | ORAL | Status: DC

## 2013-09-07 NOTE — Assessment & Plan Note (Signed)
Patient does have nonspecific joint pains overall. This appear that patient does have difficulty with doing a significant amount of activities. Differential includes chronic pain syndrome, autoimmune disease, psoriatic arthritis being high but so far no signs of inflammation within normal sedimentation rate. Patient is unable to lift secondary to eye pathology so I do think that patient's sedentary lifestyle is also contributing to his aches and pains. Underlying depression and anxiety could also be contributing. Patient will not take medication secondary to side effects. This will be a complicated case. After discussing multiple different treatment options patient was more willing to attempt to take gabapentin at night at a very low dose. Hopefully that this can help with some of his aches and pains. We discussed over-the-counter medications it likely would not cause significant discomfort either. Patient is going to try these small changes and come back again in 3-4 weeks for further evaluation. Patient continues to have pain we may need to consider referral to a rheumatologist. We'll discuss at followup.

## 2013-09-07 NOTE — Progress Notes (Signed)
  [facility] [address] [place], [place] [address] Phone: [phone] Subjective:    I'm seeing this patient by the request  of:  Cathlean Cower, MD   CC: Chronic pain  Ricky Kelly is a 63 y.o. male coming in with complaint of chronic joint pain. Patient has had this pain for multiple years and has been on disability secondary to his pains. Patient does have chronic low back pain and known osteoarthritis. In addition this patient also has chronic cervical radiculopathy. Patient complains of everything from bilateral knee pain 2 bilateral hand pain as well as back and neck pain. Patient states that he has had this pain for a couple of decades. Patient has tried other medicines but unfortunately secondary to chronic constipation he does not take him on a regular basis. Patient denies any swelling of any of the joints. Patient states that he can be so sore though that his hands he cannot even make a fist with sometimes. Seems to be very stiff in the morning and sometimes better. Denies any association with food, time of day, but does seem to be worse when increasing activity. Patient states that this is debilitating overall. Patient states that he is seen an orthopedic surgeon previously for the cervical radiculopathy but not for these aches and pains. Patient has had lab workup recently which in a and a was negative, rheumatoid factor negative and sedimentation rate normal.     Past medical history, social, surgical and family history all reviewed in electronic medical record.   Review of Systems: No headache, visual changes, nausea, vomiting, diarrhea, constipation, dizziness, abdominal pain, skin rash, fevers, chills, night sweats, weight loss, swollen lymph nodes, body aches, joint swelling, muscle aches, chest pain, shortness of breath, mood changes.   Objective Blood pressure 100/78, pulse 97, height 5\' 5"  (1.651 m), weight 171 lb (77.565 kg),  SpO2 96.00%.  General: No apparent distress alert and oriented x3 mood and affect normal, dressed appropriately.  HEENT: Pupils equal, extraocular movements intact  Respiratory: Patient's speak in full sentences and does not appear short of breath  Cardiovascular: No lower extremity edema, non tender, no erythema  Skin: Warm dry intact with no signs of infection or rash on extremities or on axial skeleton.  Abdomen: Soft nontender  Neuro: Cranial nerves II through XII are intact, neurovascularly intact in all extremities with 2+ DTRs and 2+ pulses.  Lymph: No lymphadenopathy of posterior or anterior cervical chain or axillae bilaterally.  Gait normal with good balance and coordination. Walks with a cane MSK:  Non tender with full range of motion and good stability and symmetric strength and tone of shoulders, elbows, wrist, hip, knee and ankles bilaterally. Patient does have some generalized tenderness but no specific findings.    Impression and Recommendations:     This case required medical decision making of moderate complexity.

## 2013-09-07 NOTE — Patient Instructions (Signed)
Good to meet you Gabapentin 100mg  at night.  This is a prescription and will help you with sleep.  Vitamin D 200 IU daily.  Turmeric 500mg  twice daily.  Consider when sitting, consider soup cans and do a little safe lifting.  Heat 10 minutes then ice 10 minutes for any joint.  We are going to get a bone density scan and they will call you.  I will see you again in 3 weeks.

## 2013-09-20 ENCOUNTER — Ambulatory Visit (INDEPENDENT_AMBULATORY_CARE_PROVIDER_SITE_OTHER)
Admission: RE | Admit: 2013-09-20 | Discharge: 2013-09-20 | Disposition: A | Discharge status: Home / Self Care | Payer: Medicare Other | Source: Ambulatory Visit | Attending: Family Medicine | Admitting: Family Medicine

## 2013-09-20 DIAGNOSIS — Z13820 Encounter for screening for osteoporosis: Secondary | ICD-10-CM | POA: Diagnosis not present

## 2013-09-20 DIAGNOSIS — M2550 Pain in unspecified joint: Secondary | ICD-10-CM | POA: Diagnosis not present

## 2013-09-28 ENCOUNTER — Ambulatory Visit (INDEPENDENT_AMBULATORY_CARE_PROVIDER_SITE_OTHER): Payer: Medicare Other | Admitting: Family Medicine

## 2013-09-28 ENCOUNTER — Encounter: Payer: Self-pay | Admitting: Family Medicine

## 2013-09-28 VITALS — BP 114/82 | HR 81 | Ht 65.0 in | Wt 171.0 lb

## 2013-09-28 DIAGNOSIS — G8929 Other chronic pain: Secondary | ICD-10-CM | POA: Diagnosis not present

## 2013-09-28 DIAGNOSIS — G894 Chronic pain syndrome: Secondary | ICD-10-CM | POA: Diagnosis not present

## 2013-09-28 DIAGNOSIS — M359 Systemic involvement of connective tissue, unspecified: Secondary | ICD-10-CM | POA: Diagnosis not present

## 2013-09-28 NOTE — Assessment & Plan Note (Signed)
Patient does have what appears to be a chronic pain syndrome. Patient also has a history of psoriasis as well as ocular problems they do make me think that there is an underlying autoimmune disease that could be contributing. We discussed with patient about the possibility of getting further on the labs as well as potential infectious labs such as Lyme disease. Discussed with patient that this is not entirely my specialty and then I do think he may have greater benefit from seeing a rheumatologist. Patient actually was in agreement. At this point patient will be referred to rheumatology for further evaluation and treatment. Encourage patient to try gabapentin at the 100 mg at night to see if that could be beneficial. Patient though is very hesitant to take any other prescription medications at this time. Patien with me on an as needed basis   Spent greater than 25 minutes with patient face-to-face and had greater than 50% of counseling including as described above in assessment and plan.

## 2013-09-28 NOTE — Progress Notes (Signed)
  [facility] [place] [place], [place] [address] Phone: [phone] Subjective:      CC: Chronic pain followup  BWL:[id number] Ricky Kelly is a 63 y.o. male coming in with complaint of chronic joint pain. Patient has had this pain for quite some time. Patient was given suggested some over-the-counter medications as well as prescription for gabapentin. Patient did not take his medications as he is a side effects. Patient did not increase his protein supplementation and continued everything that is been doing. Patient has not made any improvement at all at this time. Patient continues to have significant difficulty even with regular activities of daily living. Patient states that he gets tired after walking about 100-200 feet. Patient states that he is joint pain all the time in numerous joints. No specific findings she states. Back pain as well as neck pain is a constant as well. Patient did have a negative ANA as well as rheumatoid factor previously.     Past medical history, social, surgical and family history all reviewed in electronic medical record.   Review of Systems: No headache, visual changes, nausea, vomiting, diarrhea, constipation, dizziness, abdominal pain, skin rash, fevers, chills, night sweats, weight loss, swollen lymph nodes, body aches, joint swelling, muscle aches, chest pain, shortness of breath, mood changes.   Objective Blood pressure 114/82, pulse 81, height 5\' 5"  (1.651 m), weight 171 lb (77.565 kg), SpO2 98.00%.  General: No apparent distress alert and oriented x3 mood and affect normal, dressed appropriately.  HEENT: Pupils equal, extraocular movements intact  Respiratory: Patient's speak in full sentences and does not appear short of breath  Cardiovascular: No lower extremity edema, non tender, no erythema  Skin: Warm dry intact with no signs of infection or rash on extremities or on axial skeleton.  Abdomen: Soft  nontender  Neuro: Cranial nerves II through XII are intact, neurovascularly intact in all extremities with 2+ DTRs and 2+ pulses.  Lymph: No lymphadenopathy of posterior or anterior cervical chain or axillae bilaterally.  Gait normal with good balance and coordination. Walks with a cane MSK:  Non tender with full range of motion and good stability and symmetric strength and tone of shoulders, elbows, wrist, hip, knee and ankles bilaterally. Patient does have some generalized tenderness but no specific findings. No change from previous exam.    Impression and Recommendations:     This case required medical decision making of moderate complexity.

## 2013-09-28 NOTE — Patient Instructions (Signed)
Good to see you I am sorry we have not made strides We will get you in with a rheumatologist.  I am here if you have questions or need anything else.

## 2013-10-04 DIAGNOSIS — H33199 Other retinoschisis and retinal cysts, unspecified eye: Secondary | ICD-10-CM | POA: Diagnosis not present

## 2013-10-13 ENCOUNTER — Ambulatory Visit (INDEPENDENT_AMBULATORY_CARE_PROVIDER_SITE_OTHER): Payer: Medicare Other | Admitting: *Deleted

## 2013-10-13 DIAGNOSIS — Z23 Encounter for immunization: Secondary | ICD-10-CM

## 2013-11-07 DIAGNOSIS — M79643 Pain in unspecified hand: Secondary | ICD-10-CM | POA: Diagnosis not present

## 2013-11-07 DIAGNOSIS — L401 Generalized pustular psoriasis: Secondary | ICD-10-CM | POA: Diagnosis not present

## 2013-11-07 DIAGNOSIS — M2550 Pain in unspecified joint: Secondary | ICD-10-CM | POA: Diagnosis not present

## 2013-11-21 DIAGNOSIS — L401 Generalized pustular psoriasis: Secondary | ICD-10-CM | POA: Diagnosis not present

## 2013-11-21 DIAGNOSIS — M2550 Pain in unspecified joint: Secondary | ICD-10-CM | POA: Diagnosis not present

## 2013-11-22 ENCOUNTER — Telehealth: Payer: Self-pay | Admitting: Internal Medicine

## 2013-11-22 NOTE — Telephone Encounter (Signed)
Called the patient left a detailed message of name of psy per PCP.

## 2013-11-22 NOTE — Telephone Encounter (Signed)
I think Dr Letta Moynahan would be excellent, and she has been involved and helpful in many other patients over the past few yrs.

## 2013-11-22 NOTE — Telephone Encounter (Signed)
Pt wants Dr Jenny Reichmann to recommend a good Psy MD.

## 2013-12-01 DIAGNOSIS — F4321 Adjustment disorder with depressed mood: Secondary | ICD-10-CM | POA: Diagnosis not present

## 2014-02-15 ENCOUNTER — Ambulatory Visit (INDEPENDENT_AMBULATORY_CARE_PROVIDER_SITE_OTHER): Payer: Medicare Other | Admitting: Internal Medicine

## 2014-02-15 ENCOUNTER — Encounter: Payer: Self-pay | Admitting: Internal Medicine

## 2014-02-15 VITALS — BP 130/86 | HR 87 | Temp 98.1°F | Ht 65.0 in | Wt 171.0 lb

## 2014-02-15 DIAGNOSIS — G4700 Insomnia, unspecified: Secondary | ICD-10-CM

## 2014-02-15 DIAGNOSIS — E785 Hyperlipidemia, unspecified: Secondary | ICD-10-CM | POA: Diagnosis not present

## 2014-02-15 DIAGNOSIS — Z23 Encounter for immunization: Secondary | ICD-10-CM

## 2014-02-15 DIAGNOSIS — F411 Generalized anxiety disorder: Secondary | ICD-10-CM

## 2014-02-15 DIAGNOSIS — R220 Localized swelling, mass and lump, head: Secondary | ICD-10-CM | POA: Diagnosis not present

## 2014-02-15 DIAGNOSIS — M545 Low back pain: Secondary | ICD-10-CM

## 2014-02-15 DIAGNOSIS — G8929 Other chronic pain: Secondary | ICD-10-CM

## 2014-02-15 MED ORDER — ALPRAZOLAM 0.5 MG PO TABS: ORAL_TABLET | ORAL | Status: DC

## 2014-02-15 NOTE — Addendum Note (Signed)
Addended by: Sharon Seller B on: 02/15/2014 02:29 PM   Modules accepted: Orders

## 2014-02-15 NOTE — Assessment & Plan Note (Signed)
For lower chol diet, declines statin,  to f/u any worsening symptoms or concerns

## 2014-02-15 NOTE — Progress Notes (Signed)
Pre visit review using our clinic review tool, if applicable. No additional management support is needed unless otherwise documented below in the visit note. 

## 2014-02-15 NOTE — Progress Notes (Signed)
Subjective:    Patient ID: Ricky Kelly, male    DOB: 1950-08-16, 64 y.o.   MRN: [id number]  HPI  Here to f/u, c/o right facial swelling witih some mild discomfort, worsened over the past 1-2 mo, did seem larger at one point now less so, but no mouth, sinus or ear symptoms. No HA, fever, cough, and  Pt denies fever, wt loss, night sweats, loss of appetite, or other constitutional symptoms  No hx of malignancy.  Decided not to take the statin from last visit, does not want to risk side effect.  Also did not take the ambien from last visit, for same reason. Denies worsening depressive symptoms, suicidal ideation, or panic; has ongoing anxiety, not increased recently.  Pt continues to have recurring LBP without change in severity, bowel or bladder change, fever, wt loss,  worsening LE pain/numbness/weakness, gait change or falls.  Past Medical History  Diagnosis Date  . Anxiety   . Depression   . Hyperlipidemia   . Adjustment disorder 1/10    with depressive symptoms  . Lumbago   . Hemorrhoids   . Hearing loss     sensorineural  bilateral  . Retinal tear     bilateral no surgery  . Allergic rhinitis   . Psoriasis   . Chronic low back pain 08/12/2010  . Chronic cervical radiculopathy 02/02/2013   Past Surgical History  Procedure Laterality Date  . Hemorrhoid surgery      reports that he has never smoked. He does not have any smokeless tobacco history on file. He reports that he does not drink alcohol or use illicit drugs. family history includes Cancer in his father and mother; Diabetes in an other family member. Allergies  Allergen Reactions  . Allegra [Fexofenadine Hcl] Other (See Comments)    constipation  . Escitalopram Oxalate     constipation  . Naproxen     constipation   Current Outpatient Prescriptions on File Prior to Visit  Medication Sig Dispense Refill  . ALPRAZolam (XANAX) 0.5 MG tablet TAKE ONE-HALF TO ONE TABLET BY MOUTH EVERY DAY AS NEEDED 30 tablet 5  .  aspirin 325 MG buffered tablet Take 325 mg by mouth as needed.      . Calcitriol (VECTICAL) 3 MCG/GM cream Apply topically at bedtime.      . fluticasone (FLONASE) 50 MCG/ACT nasal spray Place 2 sprays into the nose daily. 16 g 3  . gabapentin (NEURONTIN) 100 MG capsule Take 1 capsule (100 mg total) by mouth at bedtime. 30 capsule 3  . ibuprofen (ADVIL,MOTRIN) 200 MG tablet Take 200 mg by mouth as needed.       No current facility-administered medications on file prior to visit.    Review of Systems  Constitutional: Negative for unusual diaphoresis or other sweats  HENT: Negative for ringing in ear Eyes: Negative for double vision or worsening visual disturbance.  Respiratory: Negative for choking and stridor.   Gastrointestinal: Negative for vomiting or other signifcant bowel change Genitourinary: Negative for hematuria or decreased urine volume.  Musculoskeletal: Negative for other MSK pain or swelling Skin: Negative for color change and worsening wound.  Neurological: Negative for tremors and numbness other than noted  Psychiatric/Behavioral: Negative for decreased concentration or agitation other than above       Objective:   Physical Exam BP 130/86 mmHg  Pulse 87  Temp(Src) 98.1 F (36.7 C) (Oral)  Ht 5\' 5"  (1.651 m)  Wt 171 lb (77.565 kg)  BMI 28.46  kg/m2  SpO2 97% VS noted,  Constitutional: Pt appears well-developed, well-nourished.  HENT: Head: NCAT.  Right Ear: External ear normal.  Left Ear: External ear normal.  Right angle of jaw area with mult firm ?immobile nodular mass (lymph nodes?) extending to right submandibular, nontender to palpate, with some asymmetric swelling preauricular, angle of jaw and submandibular Eyes: . Pupils are equal, round, and reactive to light. Conjunctivae and EOM are normal Neck: Normal range of motion. Neck supple.  Cardiovascular: Normal rate and regular rhythm.   Pulmonary/Chest: Effort normal and breath sounds without rales or  wheezing.  Abd:  Soft, NT, ND, + BS Neurological: Pt is alert. Not confused , motor grossly intact Skin: Skin is warm. No rash Psychiatric: Pt behavior is normal. No agitation. mild nervous    Assessment & Plan:

## 2014-02-15 NOTE — Assessment & Plan Note (Addendum)
Will forgo labs and ct eval for now, will refer directly to ENT - r/o malignancy vs other,  to f/u any worsening symptoms or concerns  Note:  Total time for pt hx, exam, review of record with pt in the room, determination of diagnoses and plan for further eval and tx is > 40 min, with over 50% spent in coordination and counseling of patient

## 2014-02-15 NOTE — Assessment & Plan Note (Signed)
stable overall by history and exam, recent data reviewed with pt, and pt to continue medical treatment as before,  to f/u any worsening symptoms or concerns Lab Results  Component Value Date   WBC 7.9 08/10/2013   HGB 15.0 08/10/2013   HCT 44.5 08/10/2013   PLT 208.0 08/10/2013   GLUCOSE 87 08/10/2013   CHOL 245* 08/10/2013   TRIG 221.0* 08/10/2013   HDL 47.00 08/10/2013   LDLDIRECT 182.7 08/10/2013   LDLCALC 126* 05/04/2008   ALT 30 08/10/2013   AST 30 08/10/2013   NA 139 08/10/2013   K 4.3 08/10/2013   CL 100 08/10/2013   CREATININE 1.0 08/10/2013   BUN 15 08/10/2013   CO2 33* 08/10/2013   TSH 3.49 08/10/2013   PSA 0.86 08/10/2013   For xanax refill

## 2014-02-15 NOTE — Patient Instructions (Signed)
You had the Pneumovax shot today  We would plan for the Prevnar shot at 64yo, then a repeat Pneumovax at [facility] to not take the [place] as you are not wanting to take this  Please take all new medication OTC  - the benadryl 50 mg at night as needed\  Please call if you change your mind about taking the statin  Please continue all other medications as before, and refills have been done if requested.  Please have the pharmacy call with any other refills you may need.  Please continue your efforts at being more active, low cholesterol diet, and weight control.  You are otherwise up to date with prevention measures today.  Please keep your appointments with your specialists as you may have planned  You will be contacted regarding the referral for: ENT  - to see [facility] now [place])  Please return in 6 months, or sooner if needed

## 2014-02-15 NOTE — Assessment & Plan Note (Signed)
Ok to try otc benadryl 50 qhs prn

## 2014-02-15 NOTE — Assessment & Plan Note (Signed)
stable overall by history and exam, , and pt to continue medical treatment as before,  to f/u any worsening symptoms or concerns  

## 2014-02-16 ENCOUNTER — Other Ambulatory Visit: Payer: Self-pay | Admitting: Otolaryngology

## 2014-02-16 DIAGNOSIS — D37030 Neoplasm of uncertain behavior of the parotid salivary glands: Secondary | ICD-10-CM | POA: Diagnosis not present

## 2014-02-16 DIAGNOSIS — K118 Other diseases of salivary glands: Secondary | ICD-10-CM

## 2014-02-20 ENCOUNTER — Ambulatory Visit
Admission: RE | Admit: 2014-02-20 | Discharge: 2014-02-20 | Disposition: A | Discharge status: Home / Self Care | Payer: Medicare Other | Source: Ambulatory Visit | Attending: Otolaryngology | Admitting: Otolaryngology

## 2014-02-20 DIAGNOSIS — R220 Localized swelling, mass and lump, head: Secondary | ICD-10-CM | POA: Diagnosis not present

## 2014-02-20 DIAGNOSIS — K118 Other diseases of salivary glands: Secondary | ICD-10-CM

## 2014-02-20 MED ORDER — IOHEXOL 300 MG/ML  SOLN
75.0000 mL | Freq: Once | INTRAMUSCULAR | Status: AC | PRN
  Administered 2014-02-20: 75 mL via INTRAVENOUS

## 2014-02-21 DIAGNOSIS — F4321 Adjustment disorder with depressed mood: Secondary | ICD-10-CM | POA: Diagnosis not present

## 2014-02-24 DIAGNOSIS — D37030 Neoplasm of uncertain behavior of the parotid salivary glands: Secondary | ICD-10-CM | POA: Diagnosis not present

## 2014-03-08 DIAGNOSIS — F4321 Adjustment disorder with depressed mood: Secondary | ICD-10-CM | POA: Diagnosis not present

## 2014-03-23 ENCOUNTER — Telehealth: Payer: Self-pay | Admitting: Internal Medicine

## 2014-03-23 DIAGNOSIS — E785 Hyperlipidemia, unspecified: Secondary | ICD-10-CM

## 2014-03-23 NOTE — Telephone Encounter (Signed)
Pt called in and has a few question, would like a call back from a nurse.  Would not tell me what it was regarding

## 2014-03-24 NOTE — Telephone Encounter (Signed)
Pt. Verbalized understanding. Very pleased with referral.

## 2014-03-24 NOTE — Telephone Encounter (Signed)
Pt. Has a history of high cholesterol. From the conversion on the phone, he has a lot of questions about his foods and diet. Possibly needs to see a dietician. He wants to know about the best fish to eat in order to keep his cholesterol down. He also wanted to know about the best fruits to eat as well as the best cooking oil and nuts to eat. I advised him to stay away from vegetable oil as it was the worst oil to use. Advised him to use oil sparingly, but if he must use it, use olive oil or coconut oil. He is trying very hard to watch his cholesterol levels before his return appt. In July.

## 2014-03-24 NOTE — Telephone Encounter (Signed)
Any fish is ok for a low cholesterol diet, since no fish has animal cholesterol  OK for referral to dietitian - has been done

## 2014-03-30 DIAGNOSIS — F4321 Adjustment disorder with depressed mood: Secondary | ICD-10-CM | POA: Diagnosis not present

## 2014-04-18 DIAGNOSIS — F4321 Adjustment disorder with depressed mood: Secondary | ICD-10-CM | POA: Diagnosis not present

## 2014-04-19 DIAGNOSIS — H26492 Other secondary cataract, left eye: Secondary | ICD-10-CM | POA: Diagnosis not present

## 2014-04-19 DIAGNOSIS — H04123 Dry eye syndrome of bilateral lacrimal glands: Secondary | ICD-10-CM | POA: Diagnosis not present

## 2014-04-19 DIAGNOSIS — H2511 Age-related nuclear cataract, right eye: Secondary | ICD-10-CM | POA: Diagnosis not present

## 2014-04-19 DIAGNOSIS — H35033 Hypertensive retinopathy, bilateral: Secondary | ICD-10-CM | POA: Diagnosis not present

## 2014-04-25 ENCOUNTER — Telehealth: Payer: Self-pay | Admitting: Internal Medicine

## 2014-04-25 MED ORDER — ATORVASTATIN CALCIUM 10 MG PO TABS: 10.0000 mg | ORAL_TABLET | Freq: Every day | ORAL | Status: DC

## 2014-04-25 NOTE — Telephone Encounter (Signed)
Done erx  Let me know if any further questions

## 2014-04-25 NOTE — Telephone Encounter (Signed)
Pt called in and has decided that he needs to take the med for his cholesterol, He said that Dr Jenny Reichmann told him to just call in if he ever decided he needed it.   He also has a few question about meds before it is called in.    Best number [phone]

## 2014-04-26 NOTE — Telephone Encounter (Signed)
Notified pt md sent lipitor to [facility]...Ricky Kelly

## 2014-05-03 DIAGNOSIS — H26492 Other secondary cataract, left eye: Secondary | ICD-10-CM | POA: Diagnosis not present

## 2014-05-03 DIAGNOSIS — Z961 Presence of intraocular lens: Secondary | ICD-10-CM | POA: Diagnosis not present

## 2014-05-06 DIAGNOSIS — H26492 Other secondary cataract, left eye: Secondary | ICD-10-CM | POA: Diagnosis not present

## 2014-05-31 DIAGNOSIS — H1132 Conjunctival hemorrhage, left eye: Secondary | ICD-10-CM | POA: Diagnosis not present

## 2014-06-08 DIAGNOSIS — F4321 Adjustment disorder with depressed mood: Secondary | ICD-10-CM | POA: Diagnosis not present

## 2014-06-15 DIAGNOSIS — H35372 Puckering of macula, left eye: Secondary | ICD-10-CM | POA: Diagnosis not present

## 2014-06-15 DIAGNOSIS — Z961 Presence of intraocular lens: Secondary | ICD-10-CM | POA: Diagnosis not present

## 2014-06-15 DIAGNOSIS — L718 Other rosacea: Secondary | ICD-10-CM | POA: Diagnosis not present

## 2014-06-15 DIAGNOSIS — H1132 Conjunctival hemorrhage, left eye: Secondary | ICD-10-CM | POA: Diagnosis not present

## 2014-06-15 DIAGNOSIS — H40052 Ocular hypertension, left eye: Secondary | ICD-10-CM | POA: Diagnosis not present

## 2014-07-05 DIAGNOSIS — F4321 Adjustment disorder with depressed mood: Secondary | ICD-10-CM | POA: Diagnosis not present

## 2014-07-18 ENCOUNTER — Encounter: Payer: Self-pay | Admitting: Internal Medicine

## 2014-07-18 ENCOUNTER — Ambulatory Visit (INDEPENDENT_AMBULATORY_CARE_PROVIDER_SITE_OTHER): Payer: Medicare Other | Admitting: Internal Medicine

## 2014-07-18 VITALS — BP 118/76 | HR 87 | Temp 98.0°F | Ht 66.0 in | Wt 162.0 lb

## 2014-07-18 DIAGNOSIS — M5412 Radiculopathy, cervical region: Secondary | ICD-10-CM

## 2014-07-18 DIAGNOSIS — R220 Localized swelling, mass and lump, head: Secondary | ICD-10-CM

## 2014-07-18 MED ORDER — GABAPENTIN 100 MG PO CAPS: 100.0000 mg | ORAL_CAPSULE | Freq: Three times a day (TID) | ORAL | Status: DC

## 2014-07-18 NOTE — Patient Instructions (Signed)
OK to take the gabapentin at 100 mg three times per day  Please continue all other medications as before, and refills have been done if requested.  Please have the pharmacy call with any other refills you may need.  You will be contacted regarding the referral for: ENT referral, as well as MRI neck, and Orthopedic referral  Please keep your appointments with your specialists as you may have planned

## 2014-07-18 NOTE — Assessment & Plan Note (Signed)
Mod to severe pain with neuro change, declines pain med, for trial gabapentin 100 tid , also C-spine mri, and ortho referral,

## 2014-07-18 NOTE — Progress Notes (Signed)
Pre visit review using our clinic review tool, if applicable. No additional management support is needed unless otherwise documented below in the visit note. 

## 2014-07-18 NOTE — Assessment & Plan Note (Signed)
Enlarging likely benign tumor, now with ? Neuritic pain related to right head - for ENT referral back to Dr Lucia Gaskins for definitive management, for gabapentin 100 tid trial

## 2014-07-18 NOTE — Progress Notes (Signed)
Subjective:    Patient ID: Ricky Kelly, male    DOB: 07-28-1950, 64 y.o.   MRN: [id number]  HPI  Here with 1 wk onset right arm pain, starting as a sharp pain to the elbow/bicep area with elbow flexion, such that he had to keep the hand below waist level to avoid the pain, seemed better, but last 2-3 days with onset right neck and arm pain achy type, mod to severe, constant but also stabbing pains to the right bicep, also some radiation from the neck to right head with pounding as well.  Also with new onset mild weakness more than usual, more difficult to write or hold pen.  No hx of cervical disc problem, but has long hx of lumbar disc issues, now on disability.  Also with enlarging mass at the right angle of jaw, has seen ENT and advised surgury but pt has been putting offl Past Medical History  Diagnosis Date  . Anxiety   . Depression   . Hyperlipidemia   . Adjustment disorder 1/10    with depressive symptoms  . Lumbago   . Hemorrhoids   . Hearing loss     sensorineural  bilateral  . Retinal tear     bilateral no surgery  . Allergic rhinitis   . Psoriasis   . Chronic low back pain 08/12/2010  . Chronic cervical radiculopathy 02/02/2013   Past Surgical History  Procedure Laterality Date  . Hemorrhoid surgery      reports that he has never smoked. He does not have any smokeless tobacco history on file. He reports that he does not drink alcohol or use illicit drugs. family history includes Cancer in his father and mother; Diabetes in an other family member. Allergies  Allergen Reactions  . Allegra [Fexofenadine Hcl] Other (See Comments)    constipation  . Escitalopram Oxalate     constipation  . Naproxen     constipation   Current Outpatient Prescriptions on File Prior to Visit  Medication Sig Dispense Refill  . ALPRAZolam (XANAX) 0.5 MG tablet TAKE ONE-HALF TO ONE TABLET BY MOUTH EVERY DAY AS NEEDED 30 tablet 5  . aspirin 325 MG buffered tablet Take 325 mg by mouth  as needed.      Marland Kitchen atorvastatin (LIPITOR) 10 MG tablet Take 1 tablet (10 mg total) by mouth daily. 90 tablet 3  . Calcitriol (VECTICAL) 3 MCG/GM cream Apply topically at bedtime.      . fluticasone (FLONASE) 50 MCG/ACT nasal spray Place 2 sprays into the nose daily. 16 g 3  . gabapentin (NEURONTIN) 100 MG capsule Take 1 capsule (100 mg total) by mouth at bedtime. 30 capsule 3  . ibuprofen (ADVIL,MOTRIN) 200 MG tablet Take 200 mg by mouth as needed.       No current facility-administered medications on file prior to visit.    Review of Systems  Constitutional: Negative for unusual diaphoresis or night sweats HENT: Negative for ringing in ear or discharge Eyes: Negative for double vision or worsening visual disturbance.  Respiratory: Negative for choking and stridor.   Gastrointestinal: Negative for vomiting or other signifcant bowel change Genitourinary: Negative for hematuria or change in urine volume.  Musculoskeletal: Negative for other MSK pain or swelling Skin: Negative for color change and worsening wound.  Neurological: Negative for tremors and numbness other than noted  Psychiatric/Behavioral: Negative for decreased concentration or agitation other than above       Objective:   Physical Exam BP 118/76 mmHg  Pulse 87  Temp(Src) 98 F (36.7 C) (Oral)  Ht 5\' 6"  (1.676 m)  Wt 162 lb (73.483 kg)  BMI 26.16 kg/m2  SpO2 97% VS noted, not ill appearing Constitutional: Pt appears in no significant distress HENT: Head: NCAT.  Right Ear: External ear normal.  Left Ear: External ear normal.  Right angle of jaw with firm 1.5 cm irreg slightly raised subq mass without overlying skin change Eyes: . Pupils are equal, round, and reactive to light. Conjunctivae and EOM are normal Neck: Normal range of motion. Neck supple. Nontender, no rash or swelling Cardiovascular: Normal rate and regular rhythm.   Pulmonary/Chest: Effort normal and breath sounds without rales or wheezing.    Neurological: Pt is alert. Not confused , motor 4+/5 RUE with dtr/sens intact, cn 2-12 intact Skin: Skin is warm. No rash, no LE edema Psychiatric: Pt behavior is normal. No agitation.      Assessment & Plan:

## 2014-07-20 ENCOUNTER — Encounter: Payer: Self-pay | Admitting: Internal Medicine

## 2014-07-20 ENCOUNTER — Ambulatory Visit (INDEPENDENT_AMBULATORY_CARE_PROVIDER_SITE_OTHER): Payer: Medicare Other | Admitting: Internal Medicine

## 2014-07-20 VITALS — BP 118/80 | HR 86 | Temp 97.9°F | Ht 66.0 in | Wt 163.0 lb

## 2014-07-20 DIAGNOSIS — B029 Zoster without complications: Secondary | ICD-10-CM | POA: Diagnosis not present

## 2014-07-20 DIAGNOSIS — R220 Localized swelling, mass and lump, head: Secondary | ICD-10-CM | POA: Diagnosis not present

## 2014-07-20 DIAGNOSIS — M5412 Radiculopathy, cervical region: Secondary | ICD-10-CM | POA: Diagnosis not present

## 2014-07-20 MED ORDER — VALACYCLOVIR HCL 1 G PO TABS: 1000.0000 mg | ORAL_TABLET | Freq: Three times a day (TID) | ORAL | Status: DC

## 2014-07-20 NOTE — Assessment & Plan Note (Signed)
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

## 2014-07-20 NOTE — Progress Notes (Signed)
Pre visit review using our clinic review tool, if applicable. No additional management support is needed unless otherwise documented below in the visit note. 

## 2014-07-20 NOTE — Patient Instructions (Signed)
Please take all new medication as prescribed - the valtrex antibiotic for the shingles  You can hold off on taking the gabapentin if the rash is not too painful  Tylenol would be fine for pain if this works ok  Please keep the appointments for the MRI and the Orthopedic for now, although if you get better in the next week, you could cancel both  Please continue all other medications as before, and refills have been done if requested.  Please have the pharmacy call with any other refills you may need.  Please keep your appointments with your other specialists as you may have planned

## 2014-07-20 NOTE — Progress Notes (Signed)
Subjective:    Patient ID: Ricky Kelly, male    DOB: [birth year], 64 y.o.   MRN: [id number]  HPI  Here to f/u, overall pain much improved, but  Actually has not yet tried the gabapentin as daughter picked up med yesterday but has not given it to him.  incidnetly tylenol seems to have helped the pain yesterday when did not really do well before.  Today with new typical shingles rash since late yesterday, no itch and fortunatelly no significant pain as well.  Rash is to RUE in same distribution as pain for which he was seen June 28, and MRI and ortho ordered.   Past Medical History  Diagnosis Date  . Anxiety   . Depression   . Hyperlipidemia   . Adjustment disorder 1/10    with depressive symptoms  . Lumbago   . Hemorrhoids   . Hearing loss     sensorineural  bilateral  . Retinal tear     bilateral no surgery  . Allergic rhinitis   . Psoriasis   . Chronic low back pain 08/12/2010  . Chronic cervical radiculopathy 02/02/2013   Past Surgical History  Procedure Laterality Date  . Hemorrhoid surgery      reports that he has never smoked. He does not have any smokeless tobacco history on file. He reports that he does not drink alcohol or use illicit drugs. family history includes Cancer in his father and mother; Diabetes in an other family member. Allergies  Allergen Reactions  . Allegra [Fexofenadine Hcl] Other (See Comments)    constipation  . Escitalopram Oxalate     constipation  . Naproxen     constipation   Current Outpatient Prescriptions on File Prior to Visit  Medication Sig Dispense Refill  . ALPRAZolam (XANAX) 0.5 MG tablet TAKE ONE-HALF TO ONE TABLET BY MOUTH EVERY DAY AS NEEDED 30 tablet 5  . Artificial Tear Ointment (ARTIFICIAL TEARS) ointment Place into both eyes as needed.    Marland Kitchen aspirin 325 MG buffered tablet Take 325 mg by mouth as needed.      Marland Kitchen atorvastatin (LIPITOR) 10 MG tablet Take 1 tablet (10 mg total) by mouth daily. 90 tablet 3  . Calcitriol  (VECTICAL) 3 MCG/GM cream Apply topically at bedtime.      . dimenhyDRINATE (DRAMAMINE) 50 MG tablet Take 25 mg by mouth daily.    . fluticasone (FLONASE) 50 MCG/ACT nasal spray Place 2 sprays into the nose daily. 16 g 3  . gabapentin (NEURONTIN) 100 MG capsule Take 1 capsule (100 mg total) by mouth 3 (three) times daily. 90 capsule 5  . ibuprofen (ADVIL,MOTRIN) 200 MG tablet Take 200 mg by mouth as needed.      [name] Glycol-Propyl Glycol (SYSTANE OP) Apply to eye 3 (three) times daily.     No current facility-administered medications on file prior to visit.   Review of Systems  Constitutional: Negative for unusual diaphoresis or night sweats HENT: Negative for ringing in ear or discharge Eyes: Negative for double vision or worsening visual disturbance.  Respiratory: Negative for choking and stridor.   Gastrointestinal: Negative for vomiting or other signifcant bowel change Genitourinary: Negative for hematuria or change in urine volume.  Musculoskeletal: Negative for other MSK pain or swelling Skin: Negative for color change and worsening wound.  Neurological: Negative for tremors and numbness other than noted  Psychiatric/Behavioral: Negative for decreased concentration or agitation other than above       Objective:   Physical  Exam BP 118/80 mmHg  Pulse 86  Temp(Src) 97.9 F (36.6 C) (Oral)  Ht 5\' 6"  (1.676 m)  Wt 163 lb (73.936 kg)  BMI 26.32 kg/m2  SpO2 97% VS noted,  afeb Constitutional: Pt appears in no significant distress HENT: Head: NCAT.  Right Ear: External ear normal.  Left Ear: External ear normal.  Eyes: . Pupils are equal, round, and reactive to light. Conjunctivae and EOM are normal Neck: Normal range of motion. Neck supple.  Cardiovascular: Normal rate and regular rhythm.   Pulmonary/Chest: Effort normal and breath sounds without rales or wheezing.  Neurological: Pt is alert. Not confused , motor grossly intact Skin: Skin is warm, no LE edema but has  typical multiple erythem grouped vesicles on erythem base from right neck to distal arm (not wrist or hand), nontender Psychiatric: Pt behavior is normal. No agitation.     Assessment & Plan:

## 2014-07-20 NOTE — Assessment & Plan Note (Signed)
Pain improved, in retrospect may be neuritic pain related to shingles, consider cancel MRI or ortho if all resolved over the next wk

## 2014-07-20 NOTE — Assessment & Plan Note (Signed)
D/w pt, for valtrex 1 gm tid for 1 wk, avoid young children or others that may not have had chicken pox or vaccination, tylenol prn, consider taking the gabapentin if pain worsens

## 2014-07-29 ENCOUNTER — Other Ambulatory Visit: Payer: Medicare Other

## 2014-07-29 ENCOUNTER — Ambulatory Visit
Admission: RE | Admit: 2014-07-29 | Discharge: 2014-07-29 | Disposition: A | Discharge status: Home / Self Care | Payer: Medicare Other | Source: Ambulatory Visit | Attending: Internal Medicine | Admitting: Internal Medicine

## 2014-07-29 DIAGNOSIS — M5412 Radiculopathy, cervical region: Secondary | ICD-10-CM

## 2014-07-29 DIAGNOSIS — M5032 Other cervical disc degeneration, mid-cervical region: Secondary | ICD-10-CM | POA: Diagnosis not present

## 2014-07-29 DIAGNOSIS — M47812 Spondylosis without myelopathy or radiculopathy, cervical region: Secondary | ICD-10-CM | POA: Diagnosis not present

## 2014-08-01 ENCOUNTER — Telehealth: Payer: Self-pay | Admitting: Internal Medicine

## 2014-08-01 NOTE — Telephone Encounter (Signed)
PLEASE NOTE: All timestamps contained within this report are represented as [place] Standard Time. CONFIDENTIALTY NOTICE: This fax transmission is intended only for the addressee. It contains information that is legally privileged, confidential or otherwise protected from use or disclosure. If you are not the intended recipient, you are strictly prohibited from reviewing, disclosing, copying using or disseminating any of this information or taking any action in reliance on or regarding this information. If you have received this fax in error, please notify us immediately by telephone so that we can arrange for its return to [place]. Phone: [phone], Toll-Free: [phone], Fax: [phone] Page: 1 of 1 Call Id: [id number] [facility] - Client [facility] Patient Name: Ricky Kelly DOB: 15-Mar-1950 Initial Comment caller states he has questions about how long shingles last Nurse Assessment Nurse: Marcelline Deist, RN, Kermit Balo Date/Time (Eastern Time): 08/01/2014 9:28:09 AM Confirm and document reason for call. If symptomatic, describe symptoms. ---Caller states he has questions about how long shingles last. Was dx about 2 weeks ago. Had the pain before the blisters appeared. Was on an antiviral. The rash slowly got better. He asks if it is possible to get the Shingles again & if getting the vaccine now would prevent any issues from re-surfacing. Also, wondering if certain medications can bring Shingles on. He recently had started on Lipitor before he developed Shingles. Has the patient traveled out of the country within the last 30 days? ---Not Applicable Does the patient require triage? ---No Please document clinical information provided and list any resource used. ---Nurse referenced Medline & Shingles guideline to answer caller's questions. Nurse is not sure about whether it is necessary for caller to be vaccinated for Shingles  once he has had it, or if medications play any role in getting Shingles. Those are questions for his Dr. Osvaldo Human is still having mild discomfort & the rash is healing, has pain rx ordered from his Dr. Guidelines Guideline Title Affirmed Question Affirmed Notes Final Disposition User Clinical Call Marcelline Deist, Lehr, Kermit Balo

## 2014-08-01 NOTE — Telephone Encounter (Signed)
Please advise in PCP's absence, thanks! 

## 2014-08-01 NOTE — Telephone Encounter (Signed)
Spoke with pt to inform him of Dr Darrol Angel response.

## 2014-08-01 NOTE — Telephone Encounter (Signed)
Shingles lesions will resolve but residual pain due to nerve irritation can persist Approx 2 year natural protection after shingles;shot indicated in 24 mos Gabapentin is usually Rxed for nerve pain (post herpetic neuralgia) No relationship between Lipitor & shingles

## 2014-08-02 ENCOUNTER — Telehealth: Payer: Self-pay | Admitting: Internal Medicine

## 2014-08-02 NOTE — Telephone Encounter (Signed)
Detailed message of results left for pt

## 2014-08-02 NOTE — Telephone Encounter (Signed)
Patient is requesting results of MRI from last week. Patient is requesting a message to be left if he does not answer.

## 2014-08-02 NOTE — Telephone Encounter (Signed)
Please inform patient that his MRI results show that there is some degenerative disc disease which is causing compression on his nerve. Therefore the next step would be to discuss with neurosurgery or orthopedics pending Dr. Gwynn Burly return, or I can send him if he would like.

## 2014-08-09 DIAGNOSIS — B0229 Other postherpetic nervous system involvement: Secondary | ICD-10-CM | POA: Diagnosis not present

## 2014-08-10 ENCOUNTER — Encounter: Payer: Self-pay | Admitting: Internal Medicine

## 2014-08-17 ENCOUNTER — Ambulatory Visit: Payer: Medicare Other | Admitting: Internal Medicine

## 2014-08-25 ENCOUNTER — Ambulatory Visit (INDEPENDENT_AMBULATORY_CARE_PROVIDER_SITE_OTHER): Payer: Medicare Other | Admitting: Internal Medicine

## 2014-08-25 ENCOUNTER — Encounter: Payer: Self-pay | Admitting: Internal Medicine

## 2014-08-25 VITALS — BP 120/72 | HR 76 | Temp 97.7°F | Ht 66.0 in | Wt 162.0 lb

## 2014-08-25 DIAGNOSIS — Z0000 Encounter for general adult medical examination without abnormal findings: Secondary | ICD-10-CM

## 2014-08-25 MED ORDER — ALPRAZOLAM 1 MG PO TABS: 1.0000 mg | ORAL_TABLET | Freq: Every evening | ORAL | Status: DC | PRN

## 2014-08-25 NOTE — Patient Instructions (Signed)
Ok to increase the xanax to 1 mg at bedtime as needed  Please continue all other medications as before, and refills have been done if requested.  Please have the pharmacy call with any other refills you may need.  Please continue your efforts at being more active, low cholesterol diet, and weight control.  You are otherwise up to date with prevention measures today.  Please keep your appointments with your specialists as you may have planned  Please go to the LAB in the Basement (turn left off the elevator) for the tests to be done at your convenience  You will be contacted by phone if any changes need to be made immediately.  Otherwise, you will receive a letter about your results with an explanation, but please check with MyChart first.  Please remember to sign up for MyChart if you have not done so, as this will be important to you in the future with finding out test results, communicating by private email, and scheduling acute appointments online when needed.  Please return in 6 months, or sooner if needed

## 2014-08-25 NOTE — Progress Notes (Signed)
Pre visit review using our clinic review tool, if applicable. No additional management support is needed unless otherwise documented below in the visit note. 

## 2014-08-25 NOTE — Assessment & Plan Note (Signed)

## 2014-08-25 NOTE — Progress Notes (Signed)
Subjective:    Patient ID: Ricky Kelly, male    DOB: 1951-01-06, 64 y.o.   MRN: [id number]  HPI  Here for wellness and f/u;  Overall doing ok;  Pt denies Chest pain, worsening SOB, DOE, wheezing, orthopnea, PND, worsening LE edema, palpitations, dizziness or syncope.  Pt denies neurological change such as new headache, facial or extremity weakness.  Pt denies polydipsia, polyuria, or low sugar symptoms. Pt states overall good compliance with treatment and medications, good tolerability, and has been trying to follow appropriate diet.  Pt denies worsening depressive symptoms, suicidal ideation or panic. No fever, night sweats, wt loss, loss of appetite, or other constitutional symptoms.  Pt states good ability with ADL's, has low fall risk, home safety reviewed and adequate, no other significant changes in hearing or vision, and only occasionally active with exercise.  RUE shingles resolved.  Decliens colonoscopy. Tolerating lipitor this time.  Has appt with orthopedic for Monday, the ENT for Tuesday aug 9.  Asks for incresaed xanax to help with sleep due to ongoing pain disrupting sleep. Past Medical History  Diagnosis Date  . Anxiety   . Depression   . Hyperlipidemia   . Adjustment disorder 1/10    with depressive symptoms  . Lumbago   . Hemorrhoids   . Hearing loss     sensorineural  bilateral  . Retinal tear     bilateral no surgery  . Allergic rhinitis   . Psoriasis   . Chronic low back pain 08/12/2010  . Chronic cervical radiculopathy 02/02/2013   Past Surgical History  Procedure Laterality Date  . Hemorrhoid surgery      reports that he has never smoked. He does not have any smokeless tobacco history on file. He reports that he does not drink alcohol or use illicit drugs. family history includes Cancer in his father and mother; Diabetes in an other family member. Allergies  Allergen Reactions  . Allegra [Fexofenadine Hcl] Other (See Comments)    constipation  .  Escitalopram Oxalate     constipation  . Naproxen     constipation   Current Outpatient Prescriptions on File Prior to Visit  Medication Sig Dispense Refill  . Artificial Tear Ointment (ARTIFICIAL TEARS) ointment Place into both eyes as needed.    Marland Kitchen aspirin 325 MG buffered tablet Take 325 mg by mouth as needed.      Marland Kitchen atorvastatin (LIPITOR) 10 MG tablet Take 1 tablet (10 mg total) by mouth daily. 90 tablet 3  . Calcitriol (VECTICAL) 3 MCG/GM cream Apply topically at bedtime.      . dimenhyDRINATE (DRAMAMINE) 50 MG tablet Take 25 mg by mouth daily.    . fluticasone (FLONASE) 50 MCG/ACT nasal spray Place 2 sprays into the nose daily. 16 g 3  . gabapentin (NEURONTIN) 100 MG capsule Take 1 capsule (100 mg total) by mouth 3 (three) times daily. 90 capsule 5  . ibuprofen (ADVIL,MOTRIN) 200 MG tablet Take 200 mg by mouth as needed.      [name] Glycol-Propyl Glycol (SYSTANE OP) Apply to eye 3 (three) times daily.    . valACYclovir (VALTREX) 1000 MG tablet Take 1 tablet (1,000 mg total) by mouth 3 (three) times daily. (Patient not taking: Reported on 08/25/2014) 21 tablet 0   No current facility-administered medications on file prior to visit.    Review of Systems Constitutional: Negative for increased diaphoresis, other activity, appetite or siginficant weight change other than noted HENT: Negative for worsening hearing loss, ear  pain, facial swelling, mouth sores and neck stiffness.   Eyes: Negative for other worsening pain, redness or visual disturbance.  Respiratory: Negative for shortness of breath and wheezing  Cardiovascular: Negative for chest pain and palpitations.  Gastrointestinal: Negative for diarrhea, blood in stool, abdominal distention or other pain Genitourinary: Negative for hematuria, flank pain or change in urine volume.  Musculoskeletal: Negative for myalgias or other joint complaints.  Skin: Negative for color change and wound or drainage.  Neurological: Negative for  syncope and numbness. other than noted Hematological: Negative for adenopathy. or other swelling Psychiatric/Behavioral: Negative for hallucinations, SI, self-injury, decreased concentration or other worsening agitation.      Objective:   Physical Exam BP 120/72 mmHg  Pulse 76  Temp(Src) 97.7 F (36.5 C) (Oral)  Ht 5\' 6"  (1.676 m)  Wt 162 lb (73.483 kg)  BMI 26.16 kg/m2  SpO2 98% VS noted,  Constitutional: Pt is oriented to person, place, and time. Appears well-developed and well-nourished, in no significant distress Head: Normocephalic and atraumatic.  Right Ear: External ear normal.  Left Ear: External ear normal.  Nose: Nose normal.  Mouth/Throat: Oropharynx is clear and moist.  Eyes: Conjunctivae and EOM are normal. Pupils are equal, round, and reactive to light.  Neck: Normal range of motion. Neck supple. No JVD present. No tracheal deviation present or significant neck LA or mass Cardiovascular: Normal rate, regular rhythm, normal heart sounds and intact distal pulses.   Pulmonary/Chest: Effort normal and breath sounds without rales or wheezing  Abdominal: Soft. Bowel sounds are normal. NT. No HSM  Musculoskeletal: Normal range of motion. Exhibits no edema.  Lymphadenopathy:  Has no cervical adenopathy.  Neurological: Pt is alert and oriented to person, place, and time. Pt has normal reflexes. No cranial nerve deficit. Motor grossly intact Skin: Skin is warm and dry. No rash noted.  Psychiatric:  Has normal mood and affect. Behavior is normal.      Assessment & Plan:

## 2014-08-28 DIAGNOSIS — M5032 Other cervical disc degeneration, mid-cervical region: Secondary | ICD-10-CM | POA: Diagnosis not present

## 2014-08-28 DIAGNOSIS — M542 Cervicalgia: Secondary | ICD-10-CM | POA: Diagnosis not present

## 2014-08-28 DIAGNOSIS — M5022 Other cervical disc displacement, mid-cervical region: Secondary | ICD-10-CM | POA: Diagnosis not present

## 2014-09-05 ENCOUNTER — Ambulatory Visit (INDEPENDENT_AMBULATORY_CARE_PROVIDER_SITE_OTHER): Payer: Medicare Other | Admitting: Internal Medicine

## 2014-09-05 ENCOUNTER — Encounter: Payer: Self-pay | Admitting: Internal Medicine

## 2014-09-05 VITALS — BP 134/84 | HR 87 | Temp 97.9°F

## 2014-09-05 DIAGNOSIS — M79644 Pain in right finger(s): Secondary | ICD-10-CM | POA: Insufficient documentation

## 2014-09-05 DIAGNOSIS — B0229 Other postherpetic nervous system involvement: Secondary | ICD-10-CM | POA: Insufficient documentation

## 2014-09-05 DIAGNOSIS — K5900 Constipation, unspecified: Secondary | ICD-10-CM | POA: Diagnosis not present

## 2014-09-05 MED ORDER — LIDOCAINE 5 % EX PTCH: 1.0000 | MEDICATED_PATCH | CUTANEOUS | Status: DC

## 2014-09-05 NOTE — Assessment & Plan Note (Signed)
supect related to the PHN, consider referal hand surgury (pt declines)

## 2014-09-05 NOTE — Progress Notes (Signed)
Subjective:    Patient ID: Ricky Kelly, male    DOB: [birth year], 64 y.o.   MRN: [id number]  HPI   Here to f/u, has persistent pain and sensitivity to the RUE at the site of the shingles previously 7 wks ago, with worst of pain to the right thumb with persistent ? slight swelling, hurts to flex thumb as well.  No fever, new rash, or trauma. Has seen ortho last wk with noting Cspine djd/disc dz but no surgury would benefit at this time.  Pain often 7-9/10.  Pt denies chest pain, increased sob or doe, wheezing, orthopnea, PND, increased LE swelling, palpitations, dizziness or syncope..  Did not take the gabapentin as 2 days seemed to exacerbate his recurring constipation? Past Medical History  Diagnosis Date  . Anxiety   . Depression   . Hyperlipidemia   . Adjustment disorder 1/10    with depressive symptoms  . Lumbago   . Hemorrhoids   . Hearing loss     sensorineural  bilateral  . Retinal tear     bilateral no surgery  . Allergic rhinitis   . Psoriasis   . Chronic low back pain 08/12/2010  . Chronic cervical radiculopathy 02/02/2013   Past Surgical History  Procedure Laterality Date  . Hemorrhoid surgery      reports that he has never smoked. He does not have any smokeless tobacco history on file. He reports that he does not drink alcohol or use illicit drugs. family history includes Cancer in his father and mother; Diabetes in an other family member. Allergies  Allergen Reactions  . Allegra [Fexofenadine Hcl] Other (See Comments)    constipation  . Escitalopram Oxalate     constipation  . Naproxen     constipation   Current Outpatient Prescriptions on File Prior to Visit  Medication Sig Dispense Refill  . ALPRAZolam (XANAX) 1 MG tablet Take 1 tablet (1 mg total) by mouth at bedtime as needed for anxiety. 30 tablet 5  . Artificial Tear Ointment (ARTIFICIAL TEARS) ointment Place into both eyes as needed.    Marland Kitchen aspirin 325 MG buffered tablet Take 325 mg by mouth as  needed.      Marland Kitchen atorvastatin (LIPITOR) 10 MG tablet Take 1 tablet (10 mg total) by mouth daily. 90 tablet 3  . Calcitriol (VECTICAL) 3 MCG/GM cream Apply topically at bedtime.      . dimenhyDRINATE (DRAMAMINE) 50 MG tablet Take 25 mg by mouth daily.    . fluticasone (FLONASE) 50 MCG/ACT nasal spray Place 2 sprays into the nose daily. 16 g 3  . gabapentin (NEURONTIN) 100 MG capsule Take 1 capsule (100 mg total) by mouth 3 (three) times daily. 90 capsule 5  . ibuprofen (ADVIL,MOTRIN) 200 MG tablet Take 200 mg by mouth as needed.      [name] Glycol-Propyl Glycol (SYSTANE OP) Apply to eye 3 (three) times daily.    . valACYclovir (VALTREX) 1000 MG tablet Take 1 tablet (1,000 mg total) by mouth 3 (three) times daily. (Patient not taking: Reported on 08/25/2014) 21 tablet 0   No current facility-administered medications on file prior to visit.     Review of Systems  Constitutional: Negative for unusual diaphoresis or night sweats HENT: Negative for ringing in ear or discharge Eyes: Negative for double vision or worsening visual disturbance.  Respiratory: Negative for choking and stridor.   Gastrointestinal: Negative for vomiting or other signifcant bowel change Genitourinary: Negative for hematuria or change in urine volume.  Musculoskeletal: Negative for other MSK pain or swelling Skin: Negative for color change and worsening wound.  Neurological: Negative for tremors and numbness other than noted  Psychiatric/Behavioral: Negative for decreased concentration or agitation other than above       Objective:   Physical Exam BP 134/84 mmHg  Pulse 87  Temp(Src) 97.9 F (36.6 C) (Oral)  SpO2 98% VS noted,  Constitutional: Pt appears in no significant distress HENT: Head: NCAT.  Right Ear: External ear normal.  Left Ear: External ear normal.  Eyes: . Pupils are equal, round, and reactive to light. Conjunctivae and EOM are normal Neck: Normal range of motion. Neck supple.  Cardiovascular:  Normal rate and regular rhythm.   Pulmonary/Chest: Effort normal and breath sounds without rales or wheezing.  Neurological: Pt is alert. Not confused , motor grossly intact, has dysesthesia diffuse post rue below elbow, right thumb tender, ? Slight diffuse swelling with slight reduced ROM to full flexion Skin: Skin is warm. No significant rash, no LE edema Psychiatric: Pt behavior is normal. No agitation.     Assessment & Plan:

## 2014-09-05 NOTE — Assessment & Plan Note (Signed)
Ok for miralax prn 

## 2014-09-05 NOTE — Assessment & Plan Note (Signed)
Ok for retry gabapentin 100 tid, call for 300 tid if not helping in 1 wk, for cool compressess, capsaicin otc, and lidoderm

## 2014-09-05 NOTE — Progress Notes (Signed)
Pre visit review using our clinic review tool, if applicable. No additional management support is needed unless otherwise documented below in the visit note. 

## 2014-09-05 NOTE — Patient Instructions (Signed)
OK to take the gabapentin as prescribed, that you have at home  Please call in 1 wk if pain not improved as much as you would like, for increase to 300 mg three times per day  If the constipation recurs, please try Miralax OTC as needed  We could consider change to Lyrica if needed  OK to try OTC Capsaicin for topical relief  Cold compresses can help as well  Please take all new medication as prescribed - the lidocaine patch (if ok with your insurance) for the right thumb  Please continue all other medications as before, and refills have been done if requested.  Please have the pharmacy call with any other refills you may need.  Please keep your appointments with your specialists as you may have planned

## 2014-09-12 ENCOUNTER — Telehealth: Payer: Self-pay

## 2014-09-12 ENCOUNTER — Other Ambulatory Visit (INDEPENDENT_AMBULATORY_CARE_PROVIDER_SITE_OTHER): Payer: Medicare Other

## 2014-09-12 DIAGNOSIS — E785 Hyperlipidemia, unspecified: Secondary | ICD-10-CM | POA: Diagnosis not present

## 2014-09-12 DIAGNOSIS — Z0000 Encounter for general adult medical examination without abnormal findings: Secondary | ICD-10-CM | POA: Diagnosis not present

## 2014-09-12 LAB — BASIC METABOLIC PANEL
BUN: 15 mg/dL (ref 6–23)
CALCIUM: 9.3 mg/dL (ref 8.4–10.5)
CHLORIDE: 100 meq/L (ref 96–112)
CO2: 30 meq/L (ref 19–32)
Creatinine, Ser: 0.94 mg/dL (ref 0.40–1.50)
GFR: 86.43 mL/min (ref >60.00)
GLUCOSE: 155 mg/dL — AB (ref 70–99)
POTASSIUM: 4.5 meq/L (ref 3.5–5.1)
Sodium: 136 mEq/L (ref 135–145)

## 2014-09-12 LAB — URINALYSIS, ROUTINE W REFLEX MICROSCOPIC
BILIRUBIN URINE: NEGATIVE
Ketones, ur: NEGATIVE
LEUKOCYTES UA: NEGATIVE
Nitrite: NEGATIVE
RBC / HPF: NONE SEEN (ref 0–?)
Specific Gravity, Urine: <=1.005 — AB (ref 1.000–1.030)
TOTAL PROTEIN, URINE-UPE24: NEGATIVE
Urine Glucose: NEGATIVE
Urobilinogen, UA: 0.2 (ref 0.0–1.0)
WBC, UA: NONE SEEN (ref 0–?)
pH: 5.5 (ref 5.0–8.0)

## 2014-09-12 LAB — LIPID PANEL
Cholesterol: 136 mg/dL (ref 0–200)
HDL: 43.7 mg/dL (ref >39.00)
LDL Cholesterol: 77 mg/dL (ref 0–99)
NonHDL: 91.99
TRIGLYCERIDES: 73 mg/dL (ref 0.0–149.0)
Total CHOL/HDL Ratio: 3
VLDL: 14.6 mg/dL (ref 0.0–40.0)

## 2014-09-12 LAB — HEPATIC FUNCTION PANEL
ALT: 22 U/L (ref 0–53)
AST: 20 U/L (ref 0–37)
Albumin: 4.3 g/dL (ref 3.5–5.2)
Alkaline Phosphatase: 48 U/L (ref 39–117)
BILIRUBIN TOTAL: 0.4 mg/dL (ref 0.2–1.2)
Bilirubin, Direct: 0.1 mg/dL (ref 0.0–0.3)
Total Protein: 7.1 g/dL (ref 6.0–8.3)

## 2014-09-12 LAB — CBC WITH DIFFERENTIAL/PLATELET
BASOS PCT: 0.5 % (ref 0.0–3.0)
Basophils Absolute: 0 10*3/uL (ref 0.0–0.1)
EOS PCT: 1.6 % (ref 0.0–5.0)
Eosinophils Absolute: 0.1 10*3/uL (ref 0.0–0.7)
HCT: 42 % (ref 39.0–52.0)
Hemoglobin: 14.4 g/dL (ref 13.0–17.0)
LYMPHS ABS: 3.1 10*3/uL (ref 0.7–4.0)
Lymphocytes Relative: 41 % (ref 12.0–46.0)
MCHC: 34.2 g/dL (ref 30.0–36.0)
MCV: 90.3 fl (ref 78.0–100.0)
MONO ABS: 0.6 10*3/uL (ref 0.1–1.0)
MONOS PCT: 7.3 % (ref 3.0–12.0)
NEUTROS ABS: 3.7 10*3/uL (ref 1.4–7.7)
NEUTROS PCT: 49.6 % (ref 43.0–77.0)
PLATELETS: 181 10*3/uL (ref 150.0–400.0)
RBC: 4.66 Mil/uL (ref 4.22–5.81)
RDW: 14 % (ref 11.5–15.5)
WBC: 7.6 10*3/uL (ref 4.0–10.5)

## 2014-09-12 LAB — PSA: PSA: 0.76 ng/mL (ref 0.10–4.00)

## 2014-09-12 LAB — TSH: TSH: 2.5 u[IU]/mL (ref 0.35–4.50)

## 2014-09-13 ENCOUNTER — Encounter: Payer: Self-pay | Admitting: Internal Medicine

## 2014-09-19 DIAGNOSIS — G894 Chronic pain syndrome: Secondary | ICD-10-CM | POA: Diagnosis not present

## 2014-09-19 DIAGNOSIS — M5032 Other cervical disc degeneration, mid-cervical region: Secondary | ICD-10-CM | POA: Diagnosis not present

## 2014-10-04 DIAGNOSIS — F4321 Adjustment disorder with depressed mood: Secondary | ICD-10-CM | POA: Diagnosis not present

## 2014-10-06 DIAGNOSIS — G5602 Carpal tunnel syndrome, left upper limb: Secondary | ICD-10-CM | POA: Diagnosis not present

## 2014-10-06 DIAGNOSIS — G5601 Carpal tunnel syndrome, right upper limb: Secondary | ICD-10-CM | POA: Diagnosis not present

## 2014-10-09 DIAGNOSIS — H3581 Retinal edema: Secondary | ICD-10-CM | POA: Diagnosis not present

## 2014-10-09 DIAGNOSIS — H33191 Other retinoschisis and retinal cysts, right eye: Secondary | ICD-10-CM | POA: Diagnosis not present

## 2014-10-09 DIAGNOSIS — H35371 Puckering of macula, right eye: Secondary | ICD-10-CM | POA: Diagnosis not present

## 2014-10-09 DIAGNOSIS — H43821 Vitreomacular adhesion, right eye: Secondary | ICD-10-CM | POA: Diagnosis not present

## 2014-10-19 ENCOUNTER — Ambulatory Visit (INDEPENDENT_AMBULATORY_CARE_PROVIDER_SITE_OTHER): Payer: Medicare Other

## 2014-10-19 DIAGNOSIS — Z23 Encounter for immunization: Secondary | ICD-10-CM

## 2014-10-25 DIAGNOSIS — F4321 Adjustment disorder with depressed mood: Secondary | ICD-10-CM | POA: Diagnosis not present

## 2014-11-06 DIAGNOSIS — G5601 Carpal tunnel syndrome, right upper limb: Secondary | ICD-10-CM | POA: Diagnosis not present

## 2014-11-06 DIAGNOSIS — G5602 Carpal tunnel syndrome, left upper limb: Secondary | ICD-10-CM | POA: Diagnosis not present

## 2014-11-22 DIAGNOSIS — F4321 Adjustment disorder with depressed mood: Secondary | ICD-10-CM | POA: Diagnosis not present

## 2014-12-01 DIAGNOSIS — G5602 Carpal tunnel syndrome, left upper limb: Secondary | ICD-10-CM | POA: Diagnosis not present

## 2014-12-01 DIAGNOSIS — G5601 Carpal tunnel syndrome, right upper limb: Secondary | ICD-10-CM | POA: Diagnosis not present

## 2014-12-26 ENCOUNTER — Encounter: Payer: Self-pay | Admitting: Neurology

## 2014-12-26 ENCOUNTER — Ambulatory Visit (INDEPENDENT_AMBULATORY_CARE_PROVIDER_SITE_OTHER): Payer: Medicare Other | Admitting: Neurology

## 2014-12-26 VITALS — BP 134/80 | HR 72 | Resp 16 | Ht 65.5 in | Wt 160.0 lb

## 2014-12-26 DIAGNOSIS — M79642 Pain in left hand: Secondary | ICD-10-CM

## 2014-12-26 DIAGNOSIS — M545 Low back pain, unspecified: Secondary | ICD-10-CM

## 2014-12-26 DIAGNOSIS — M79641 Pain in right hand: Secondary | ICD-10-CM | POA: Diagnosis not present

## 2014-12-26 DIAGNOSIS — G8929 Other chronic pain: Secondary | ICD-10-CM | POA: Diagnosis not present

## 2014-12-26 MED ORDER — DICLOFENAC SODIUM 1 % TD GEL: 2.0000 g | Freq: Four times a day (QID) | TRANSDERMAL | Status: DC

## 2014-12-26 NOTE — Progress Notes (Signed)
Reason for visit: Hand pain  Referring physician: Dr. Luberta Mutter is a 64 y.o. male  History of present illness:  Ricky Kelly is a 64 year old right-handed white male with a history of degenerative arthritis affecting the neck and low back, with chronic pain issues in this regard. The patient also reports some chronic achy pains in the hands that has been present for about 5 years. When he tries to make a fist, he has a significant increase in pain on the dorsum of the hands. He indicates that he has psoriasis, and he has been seen and evaluated through a rheumatologist within the last year. He was never told that he had rheumatoid arthritis or psoriatic arthritis. Indicates that he is also had x-rays of the hands previously. He has had blood work that included an ANA, rheumatoid factor, and sedimentation rate that were unremarkable. He is not taking daily nonsteroidal anti-inflammatory medications for the hand discomfort. He has undergone MRI of the cervical spine that was relatively unremarkable with exception of some spondylitic changes without significant nerve root or spinal cord compression. The patient has undergone EMG and nerve conduction study evaluation that have not shown evidence of a neuropathy affecting the upper extremities. The patient is sent to this office for an evaluation. Indicates that he has difficulty holding onto objects secondary to pain, he will drop things frequently. He denies any pain radiating from the neck down arms or pain in the low back radiating down the legs. He denies any difficulty controlling the bowels or the bladder, and he does report some balance issues, but no recent falls. He uses a cane for ambulation. The cane helps the low back pain.  Past Medical History  Diagnosis Date  . Anxiety   . Depression   . Hyperlipidemia   . Adjustment disorder 1/10    with depressive symptoms  . Lumbago   . Hemorrhoids   . Hearing loss    sensorineural  bilateral  . Retinal tear     bilateral no surgery  . Allergic rhinitis   . Psoriasis   . Chronic low back pain 08/12/2010  . Chronic cervical radiculopathy 02/02/2013    Past Surgical History  Procedure Laterality Date  . Hemorrhoid surgery    . Cataract extraction Left   . Detatched retina Left     laser surgery    Family History  Problem Relation Age of Onset  . Cancer Father     lung  . Cancer Mother     bladder  . Diabetes      Social history:  reports that he has never smoked. He does not have any smokeless tobacco history on file. He reports that he does not drink alcohol or use illicit drugs.  Medications:  Prior to Admission medications   Medication Sig Start Date End Date Taking? Authorizing Provider  ALPRAZolam Duanne Moron) 1 MG tablet Take 1 tablet (1 mg total) by mouth at bedtime as needed for anxiety. 08/25/14  Yes Biagio Borg, MD  Artificial Tear Ointment (ARTIFICIAL TEARS) ointment Place into both eyes as needed.   Yes Historical Provider, MD  aspirin 325 MG buffered tablet Take 325 mg by mouth as needed.     Yes Historical Provider, MD  atorvastatin (LIPITOR) 10 MG tablet Take 1 tablet (10 mg total) by mouth daily. 04/25/14  Yes Biagio Borg, MD  Calcitriol (VECTICAL) 3 MCG/GM cream Apply topically at bedtime.     Yes Historical Provider, MD  dimenhyDRINATE (DRAMAMINE) 50 MG tablet Take 25 mg by mouth daily.   Yes Historical Provider, MD  fluticasone (FLONASE) 50 MCG/ACT nasal spray Place 2 sprays into the nose daily. 07/27/12  Yes Biagio Borg, MD  ibuprofen (ADVIL,MOTRIN) 200 MG tablet Take 200 mg by mouth as needed.     Yes Historical Provider, MD  gabapentin (NEURONTIN) 100 MG capsule Take 1 capsule (100 mg total) by mouth 3 (three) times daily. Patient not taking: Reported on 12/26/2014 07/18/14   Biagio Borg, MD  lidocaine (LIDODERM) 5 % Place 1 patch onto the skin daily. Remove & Discard patch within 12 hours or as directed by MD Patient not taking:  Reported on 12/26/2014 09/05/14   Biagio Borg, MD  Polyethyl Glycol-Propyl Glycol (SYSTANE OP) Apply to eye 3 (three) times daily.    Historical Provider, MD  valACYclovir (VALTREX) 1000 MG tablet Take 1 tablet (1,000 mg total) by mouth 3 (three) times daily. Patient not taking: Reported on 12/26/2014 07/20/14   Biagio Borg, MD      Allergies  Allergen Reactions  . Allegra [Fexofenadine Hcl] Other (See Comments)    constipation  . Escitalopram Oxalate     constipation  . Naproxen     constipation    ROS:  Out of a complete 14 system review of symptoms, the patient complains only of the following symptoms, and all other reviewed systems are negative.  Hearing loss, ringing in the ears Skin rash, itching Double vision Constipation Joint pain, joint swelling, aching muscles Allergies, runny nose Numbness, weakness Depression, anxiety, not enough sleep, suicidal thoughts Insomnia, restless legs  Blood pressure 134/80, pulse 72, resp. rate 16, height 5' 5.5" (1.664 m), weight 160 lb (72.576 kg).  Physical Exam  General: The patient is alert and cooperative at the time of the examination.  Eyes: Pupils are equal, round, and reactive to light. Discs are flat bilaterally.  Neck: The neck is supple, no carotid bruits are noted.  Respiratory: The respiratory examination is clear.  Cardiovascular: The cardiovascular examination reveals a regular rate and rhythm, no obvious murmurs or rubs are noted.  Neuromuscular: Patient lacks about 20 of lateral rotation of the cervical spine bilaterally. He is able to flex to about 90 with the low back.  Skin: Extremities are without significant edema.  Neurologic Exam  Mental status: The patient is alert and oriented x 3 at the time of the examination. The patient has apparent normal recent and remote memory, with an apparently normal attention span and concentration ability.  Cranial nerves: Facial symmetry is present. There is good  sensation of the face to pinprick and soft touch bilaterally. The strength of the facial muscles and the muscles to head turning and shoulder shrug are normal bilaterally. Speech is well enunciated, no aphasia or dysarthria is noted. Extraocular movements are full. Visual fields are full. The tongue is midline, and the patient has symmetric elevation of the soft palate. No obvious hearing deficits are noted.  Motor: The motor testing reveals 5 over 5 strength of all 4 extremities. Good symmetric motor tone is noted throughout.  Sensory: Sensory testing is intact to pinprick, soft touch, vibration sensation, and position sense on all 4 extremities. No evidence of extinction is noted.  Coordination: Cerebellar testing reveals good finger-nose-finger and heel-to-shin bilaterally.  Gait and station: Gait is normal. Tandem gait is normal. Romberg is negative. No drift is seen.  Reflexes: Deep tendon reflexes are symmetric and normal bilaterally. Toes are downgoing  bilaterally.   MRI cervical 07/29/14:  IMPRESSION: 1. Cervical spondylosis and degenerative disc disease, causing moderate impingement at C6-7 and mild impingement at C4-5 and C5-6, as detailed above.  * MRI scan images were reviewed online. I agree with the written report.    Assessment/Plan:  1. Bilateral hand pain  2. Chronic low back, neck pain  The clinical examination suggests that his current pain is not related to a neurologic process. MRI of the cervical spine and nerve conduction and EMG evaluation has not shown evidence of a neuropathy or radiculopathy. The patient has pain intrinsic to the hand, increased with flexion of the fingers. This affects the function of the hands. The patient has been seen through rheumatology. He does have psoriasis, but he has not been told he has any issues with psoriatic arthritis. He is not on a daily nonsteroidal anti-inflammatory medication. I will give him a topical diclofenac  prescription to use to see if this helps his discomfort. The patient will otherwise follow-up on an as-needed basis.  Jill Alexanders MD 12/26/2014 6:55 PM  [facility] [address] [place], [place] [address]  Phone [phone] Fax [phone]

## 2015-01-03 DIAGNOSIS — F4321 Adjustment disorder with depressed mood: Secondary | ICD-10-CM | POA: Diagnosis not present

## 2015-02-05 DIAGNOSIS — F4321 Adjustment disorder with depressed mood: Secondary | ICD-10-CM | POA: Diagnosis not present

## 2015-02-28 DIAGNOSIS — H35031 Hypertensive retinopathy, right eye: Secondary | ICD-10-CM | POA: Diagnosis not present

## 2015-02-28 DIAGNOSIS — H35373 Puckering of macula, bilateral: Secondary | ICD-10-CM | POA: Diagnosis not present

## 2015-02-28 DIAGNOSIS — Z961 Presence of intraocular lens: Secondary | ICD-10-CM | POA: Diagnosis not present

## 2015-02-28 DIAGNOSIS — H33022 Retinal detachment with multiple breaks, left eye: Secondary | ICD-10-CM | POA: Diagnosis not present

## 2015-02-28 DIAGNOSIS — H33193 Other retinoschisis and retinal cysts, bilateral: Secondary | ICD-10-CM | POA: Diagnosis not present

## 2015-02-28 DIAGNOSIS — H2511 Age-related nuclear cataract, right eye: Secondary | ICD-10-CM | POA: Diagnosis not present

## 2015-03-08 ENCOUNTER — Ambulatory Visit (INDEPENDENT_AMBULATORY_CARE_PROVIDER_SITE_OTHER): Payer: Medicare Other | Admitting: Internal Medicine

## 2015-03-08 ENCOUNTER — Encounter: Payer: Self-pay | Admitting: Internal Medicine

## 2015-03-08 VITALS — BP 130/82 | HR 76 | Temp 98.5°F | Resp 20 | Wt 165.0 lb

## 2015-03-08 DIAGNOSIS — R220 Localized swelling, mass and lump, head: Secondary | ICD-10-CM

## 2015-03-08 DIAGNOSIS — F411 Generalized anxiety disorder: Secondary | ICD-10-CM | POA: Diagnosis not present

## 2015-03-08 DIAGNOSIS — E785 Hyperlipidemia, unspecified: Secondary | ICD-10-CM

## 2015-03-08 DIAGNOSIS — H903 Sensorineural hearing loss, bilateral: Secondary | ICD-10-CM | POA: Diagnosis not present

## 2015-03-08 MED ORDER — ALPRAZOLAM 1 MG PO TABS: 1.0000 mg | ORAL_TABLET | Freq: Every evening | ORAL | Status: DC | PRN

## 2015-03-08 MED ORDER — ATORVASTATIN CALCIUM 10 MG PO TABS: 10.0000 mg | ORAL_TABLET | Freq: Every day | ORAL | Status: DC

## 2015-03-08 NOTE — Patient Instructions (Signed)
Please continue all other medications as before, and refills have been done if requested.  Please have the pharmacy call with any other refills you may need.  Please continue your efforts at being more active, low cholesterol diet, and weight control.  You are otherwise up to date with prevention measures today.  Please keep your appointments with your specialists as you may have planned  You will be contacted regarding the referral for: ENT  Please return in 6 months, or sooner if needed

## 2015-03-08 NOTE — Progress Notes (Signed)
Subjective:    Patient ID: Ricky Kelly, male    DOB: [birth year], 65 y.o.   MRN: [id number]  HPI   Here to f/u; overall doing ok,  Pt denies chest pain, increasing sob or doe, wheezing, orthopnea, PND, increased LE swelling, palpitations, dizziness or syncope.  Pt denies new neurological symptoms such as new headache, or facial or extremity weakness or numbness.  Pt denies polydipsia, polyuria, or low sugar episode.   Pt denies new neurological symptoms such as new headache, or facial or extremity weakness or numbness.   Pt states overall good compliance with meds, mostly trying to follow appropriate diet, with wt overall stable,  but little exercise however. Still having RUE diffuse PHN after singles episode, still gets shooting pain as well to the right shoulder, intermittent, does not want furhter eval for now as is still mild. Still taking lipitor, no problems, needs med refill hardcopy.  Decliens cologaurd or colonoscopy. No current complaints  Has a persistent mass to right angle jaw, not sure if enlarged but new in the past few months, asking for ent referral.  Denies worsening depressive symptoms, suicidal ideation, or panic; has ongoing anxiety, asks for xanax refill, also lipitor, trying to follow lower chol diet. Past Medical History  Diagnosis Date  . Anxiety   . Depression   . Hyperlipidemia   . Adjustment disorder 1/10    with depressive symptoms  . Lumbago   . Hemorrhoids   . Hearing loss     sensorineural  bilateral  . Retinal tear     bilateral no surgery  . Allergic rhinitis   . Psoriasis   . Chronic low back pain 08/12/2010  . Chronic cervical radiculopathy 02/02/2013   Past Surgical History  Procedure Laterality Date  . Hemorrhoid surgery    . Cataract extraction Left   . Detatched retina Left     laser surgery    reports that he has never smoked. He does not have any smokeless tobacco history on file. He reports that he does not drink alcohol or use illicit  drugs. family history includes Cancer in his father and mother. Allergies  Allergen Reactions  . Allegra [Fexofenadine Hcl] Other (See Comments)    constipation  . Escitalopram Oxalate     constipation  . Naproxen     constipation   Current Outpatient Prescriptions on File Prior to Visit  Medication Sig Dispense Refill  . aspirin 325 MG buffered tablet Take 325 mg by mouth as needed.      . Calcitriol (VECTICAL) 3 MCG/GM cream Apply topically at bedtime.      . dimenhyDRINATE (DRAMAMINE) 50 MG tablet Take 25 mg by mouth daily.    Marland Kitchen ibuprofen (ADVIL,MOTRIN) 200 MG tablet Take 200 mg by mouth as needed.      [name] Glycol-Propyl Glycol (SYSTANE OP) Apply to eye 3 (three) times daily.     No current facility-administered medications on file prior to visit.    Review of Systems  Constitutional: Negative for unusual diaphoresis or night sweats HENT: Negative for ringing in ear or discharge Eyes: Negative for double vision or worsening visual disturbance.  Respiratory: Negative for choking and stridor.   Gastrointestinal: Negative for vomiting or other signifcant bowel change Genitourinary: Negative for hematuria or change in urine volume.  Musculoskeletal: Negative for other MSK pain or swelling Skin: Negative for color change and worsening wound.  Neurological: Negative for tremors and numbness other than noted  Psychiatric/Behavioral: Negative for decreased  concentration or agitation other than above       Objective:   Physical Exam BP 130/82 mmHg  Pulse 76  Temp(Src) 98.5 F (36.9 C) (Oral)  Resp 20  Wt 165 lb (74.844 kg)  SpO2 97% VS noted,  Constitutional: Pt appears in no significant distress HENT: Head: NCAT.  Right Ear: External ear normal.  Left Ear: External ear normal.  Eyes: . Pupils are equal, round, and reactive to light. Conjunctivae and EOM are normal Right angle jaw with < 1 cm sub vague nontender, mobile mass Neck: Normal range of motion. Neck  supple.  Cardiovascular: Normal rate and regular rhythm.   Pulmonary/Chest: Effort normal and breath sounds without rales or wheezing.  Abd:  Soft, NT, ND, + BS - no organomegaly Neurological: Pt is alert. Not confused , motor grossly intact, has severe bilat hearing loss Skin: Skin is warm. No rash, no LE edema Psychiatric: Pt behavior is normal. No agitation.     Assessment & Plan:

## 2015-03-08 NOTE — Progress Notes (Signed)
Pre visit review using our clinic review tool, if applicable. No additional management support is needed unless otherwise documented below in the visit note. 

## 2015-03-12 NOTE — Assessment & Plan Note (Signed)
stable overall by history and exam, recent data reviewed with pt, and pt to continue medical treatment as before,  to f/u any worsening symptoms or concerns Lab Results  Component Value Date   LDLCALC 77 09/12/2014   For med refill

## 2015-03-12 NOTE — Assessment & Plan Note (Signed)
stable overall by history and exam, recent data reviewed with pt, and pt to continue medical treatment as before,  to f/u any worsening symptoms or concerns Lab Results  Component Value Date   WBC 7.6 09/12/2014   HGB 14.4 09/12/2014   HCT 42.0 09/12/2014   PLT 181.0 09/12/2014   GLUCOSE 155* 09/12/2014   CHOL 136 09/12/2014   TRIG 73.0 09/12/2014   HDL 43.70 09/12/2014   LDLDIRECT 182.7 08/10/2013   LDLCALC 77 09/12/2014   ALT 22 09/12/2014   AST 20 09/12/2014   NA 136 09/12/2014   K 4.5 09/12/2014   CL 100 09/12/2014   CREATININE 0.94 09/12/2014   BUN 15 09/12/2014   CO2 30 09/12/2014   TSH 2.50 09/12/2014   PSA 0.76 09/12/2014

## 2015-03-12 NOTE — Assessment & Plan Note (Signed)
Persistent, not clear if enlarging to me, but ok for ENT referral,  to f/u any worsening symptoms or concerns

## 2015-03-12 NOTE — Assessment & Plan Note (Signed)
Chronic persistent, ? Worsening, for ent eval as well, likely need repeat audiology exam,  to f/u any worsening symptoms or concerns

## 2015-03-14 DIAGNOSIS — F4321 Adjustment disorder with depressed mood: Secondary | ICD-10-CM | POA: Diagnosis not present

## 2015-03-15 ENCOUNTER — Telehealth: Payer: Self-pay | Admitting: Internal Medicine

## 2015-03-15 NOTE — Telephone Encounter (Signed)
Pt states that certain medications constipate him. He has been on atorvastatin (LIPITOR) 10 MG tablet [id number] for about 8 mos now with no problem.  However, the last month he has noticed his bowel movements have decreased by 50%. And this last week he is having a really hard time passing his bm's. He has not changed his diet at all. He believes this may be the lipitor.  Can you please give pt a call at [phone]

## 2015-03-15 NOTE — Telephone Encounter (Signed)
Please advise, patient thinks that he is having these bowel problems due to his lipitor. He was wondering if he could cut down on his lipitor. Patient states that he is having a hard time having bowel movements and when he does they are black and tar like.

## 2015-03-15 NOTE — Telephone Encounter (Signed)
At this point, we can say to HOLD on taking the Lipitor x 4 wks , as this could be a possible cause of his symtpoms, but if possible I would ask him to re-try the medicaiton after 4wks, then let us know if happens again, as the lipitor would need to be changed

## 2015-03-20 DIAGNOSIS — D37030 Neoplasm of uncertain behavior of the parotid salivary glands: Secondary | ICD-10-CM | POA: Diagnosis not present

## 2015-03-20 DIAGNOSIS — H903 Sensorineural hearing loss, bilateral: Secondary | ICD-10-CM | POA: Diagnosis not present

## 2015-03-20 DIAGNOSIS — H9313 Tinnitus, bilateral: Secondary | ICD-10-CM | POA: Diagnosis not present

## 2015-04-05 DIAGNOSIS — H35371 Puckering of macula, right eye: Secondary | ICD-10-CM | POA: Diagnosis not present

## 2015-04-05 DIAGNOSIS — H43821 Vitreomacular adhesion, right eye: Secondary | ICD-10-CM | POA: Diagnosis not present

## 2015-05-08 ENCOUNTER — Telehealth: Payer: Self-pay | Admitting: Internal Medicine

## 2015-05-08 DIAGNOSIS — F329 Major depressive disorder, single episode, unspecified: Secondary | ICD-10-CM

## 2015-05-08 DIAGNOSIS — F32A Depression, unspecified: Secondary | ICD-10-CM

## 2015-05-08 NOTE — Telephone Encounter (Signed)
Referral done

## 2015-05-08 NOTE — Telephone Encounter (Signed)
Pt called wanting to get a referral to see a Psychiatrist. Pt states he is depressed due to many disabilities. Call pt @ [phone]. Thank you!

## 2015-05-08 NOTE — Telephone Encounter (Signed)
Please advise can we do a referral to behavorial health

## 2015-05-08 NOTE — Telephone Encounter (Signed)
The patient would like to be sent to a .[facility] provider about getting some depression meds. Please follow up with patient.

## 2015-05-08 NOTE — Telephone Encounter (Signed)
Pt Is a Dr. Jenny Reichmann pt. Routing to PCP and assistant

## 2015-05-10 DIAGNOSIS — F4321 Adjustment disorder with depressed mood: Secondary | ICD-10-CM | POA: Diagnosis not present

## 2015-07-22 IMAGING — CT CT NECK W/ CM
4 of 5 series · 16 of 33 positions shown, 19 images · IV contrast (75CC OMNI 300)
Comparison: None.

CLINICAL DATA: Right parotid mass, present for multiple years.

BUN and creatinine were obtained on site at [HOSPITAL] at
[HOSPITAL].
Results:  BUN 11 mg/dL,  Creatinine 0.9 mg/dL.
EXAM:
CT NECK WITH CONTRAST
TECHNIQUE: Multidetector CT imaging of the neck was performed using the
standard protocol following the bolus administration of intravenous
contrast.
CONTRAST:  75mL OMNIPAQUE IOHEXOL 300 MG/ML  SOLN

[Series 2: axial neck · axial · 0.45mm/px · z∈[+90,+195]mm · 3 of 106 slices shown]
[im 22/106  bone]
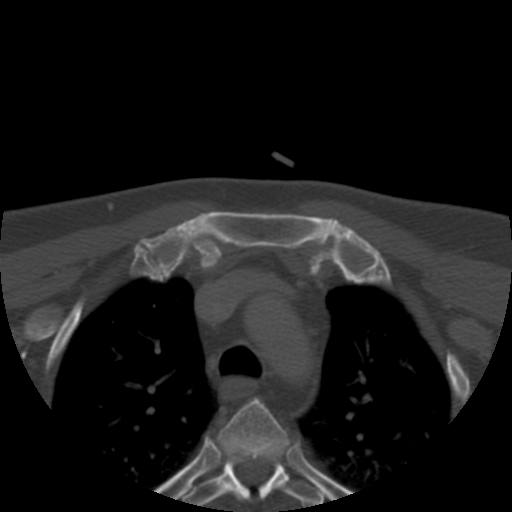
[im 43/106  bone]
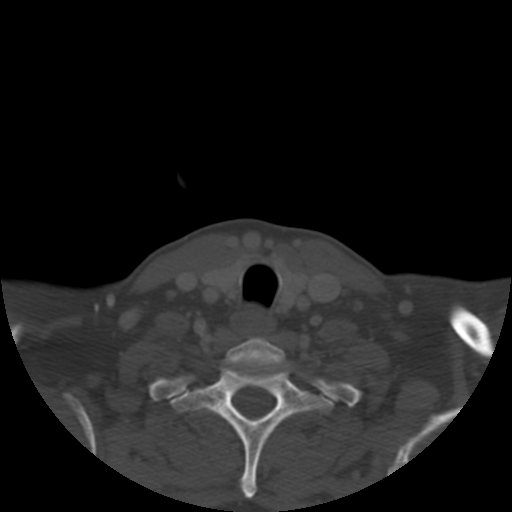
[im 64/106  bone]
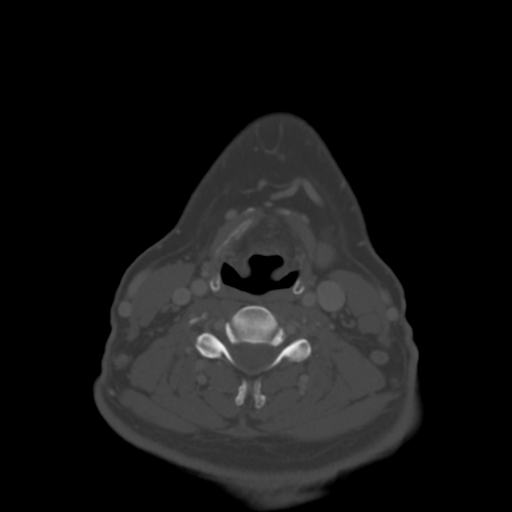

[Series 400: cor · coronal · 0.53mm/px · 3 of 113 slices shown]
[im 30/113  bone]
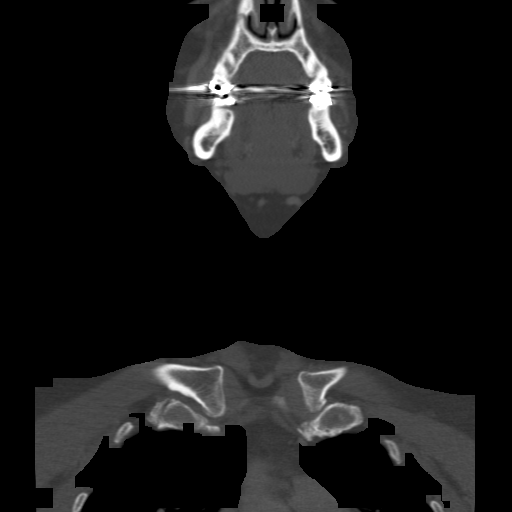
[im 48/113  bone]
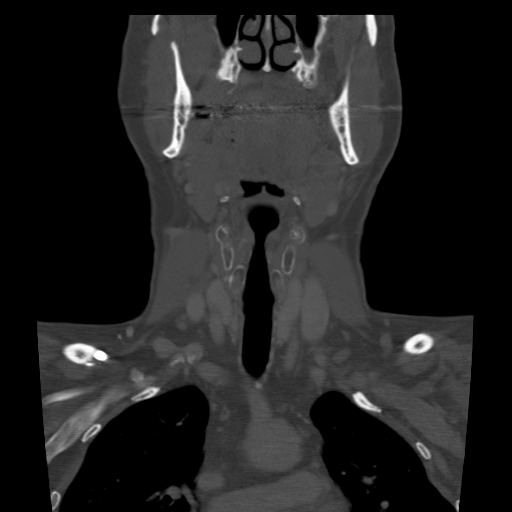
[im 65/113  bone]
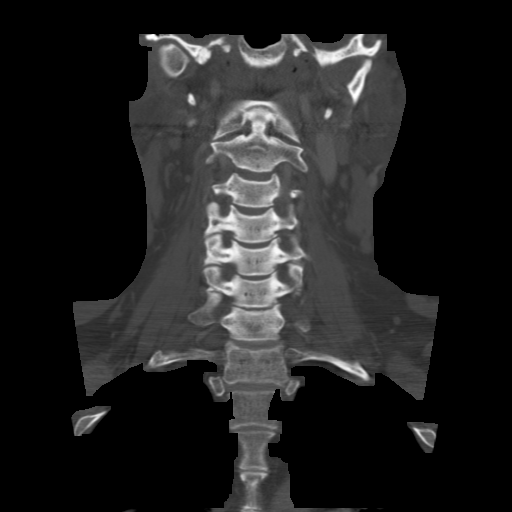

[Series 401: sag · sagittal · 0.53mm/px · 5 of 113 slices shown, 6 images]
[im 38/113  bone]
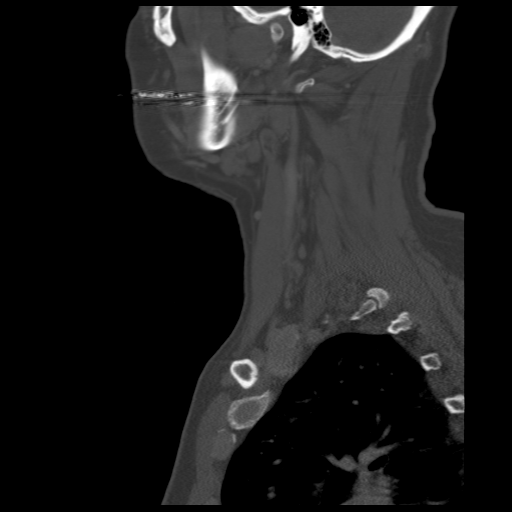
[im 47/113  bone]
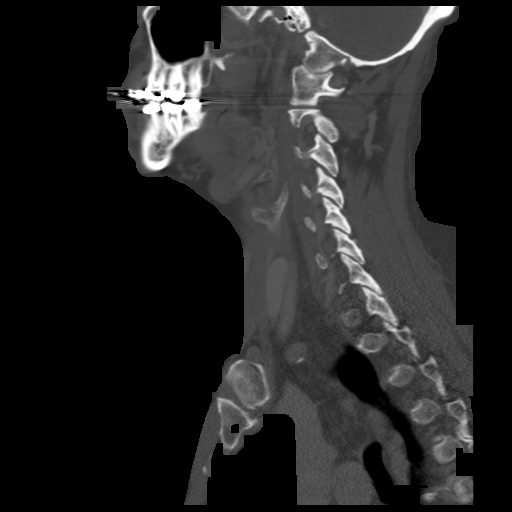
[im 57/113  soft-tissue]
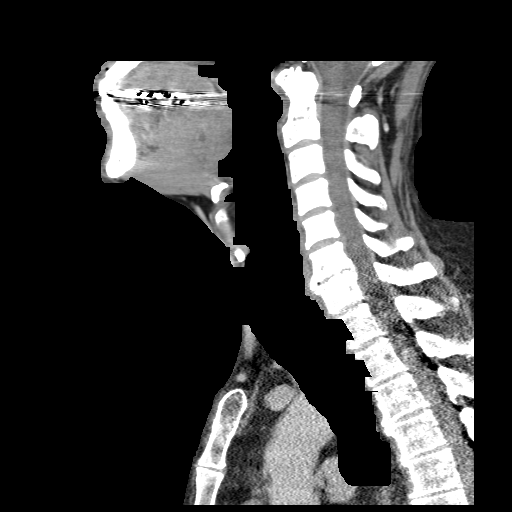
[im 57/113  bone]
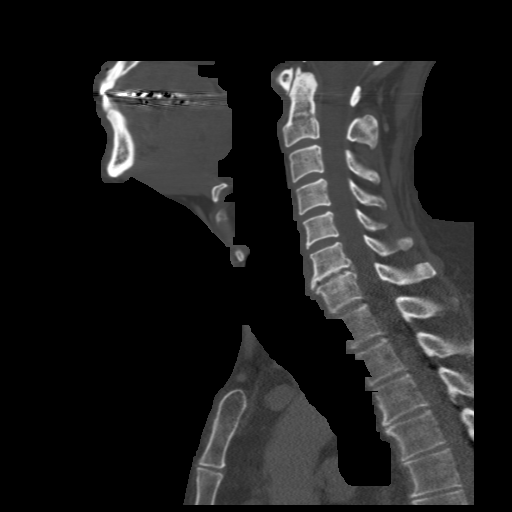
[im 66/113  bone]
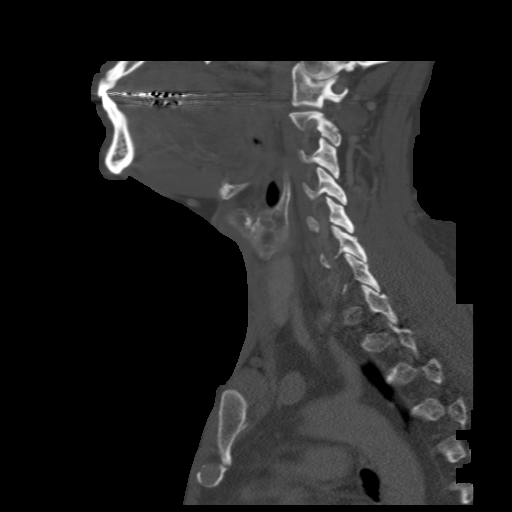
[im 75/113  bone]
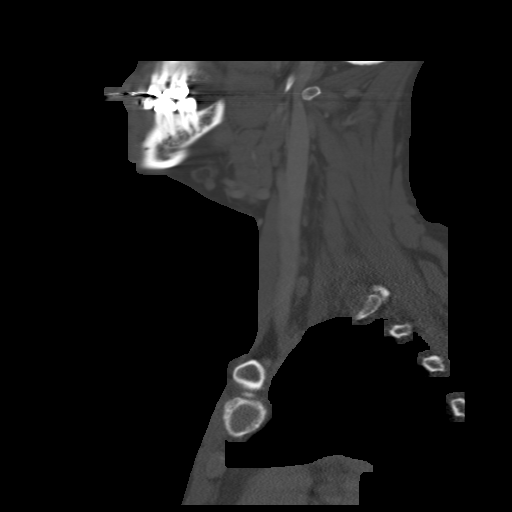

[Series 402: axial · axial · 0.45mm/px · z∈[+46,+218]mm · 5 of 137 slices shown, 7 images]
[im 23/137  soft-tissue]
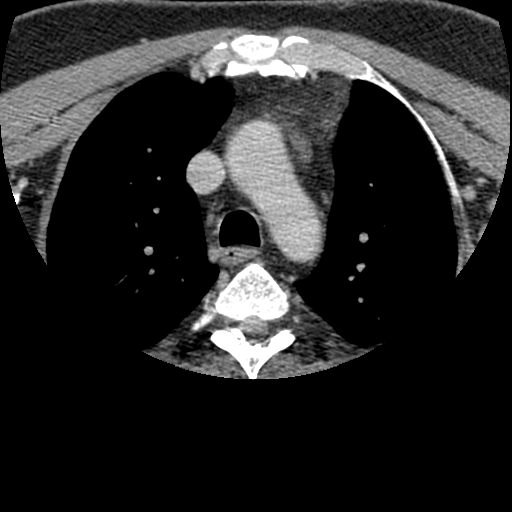
[im 23/137  bone]
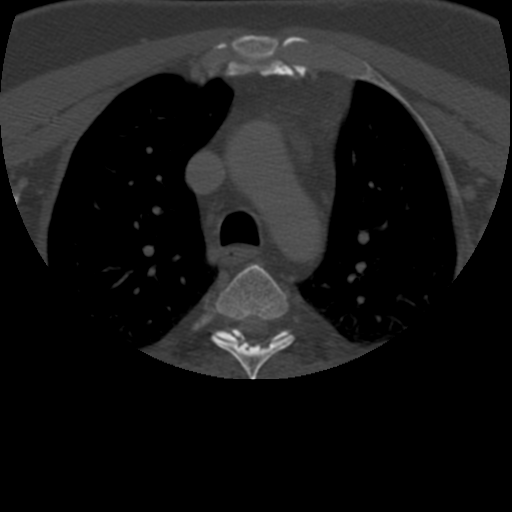
[im 46/137  bone]
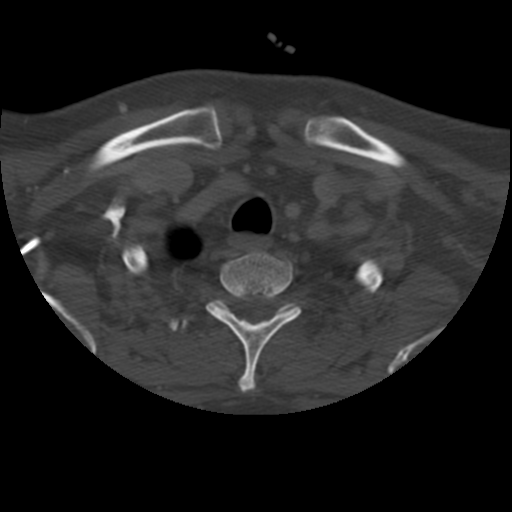
[im 69/137  bone]
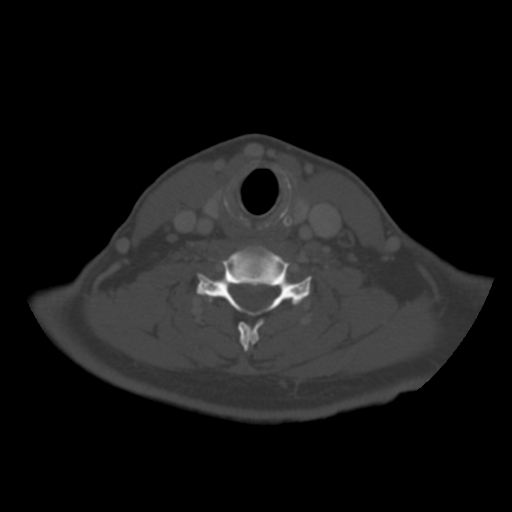
[im 91/137  bone]
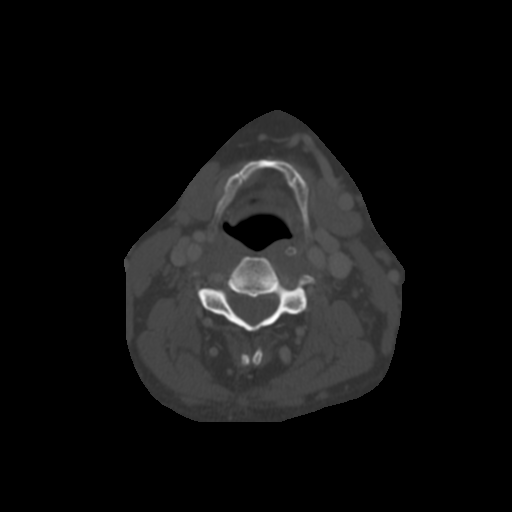
[im 114/137  soft-tissue]
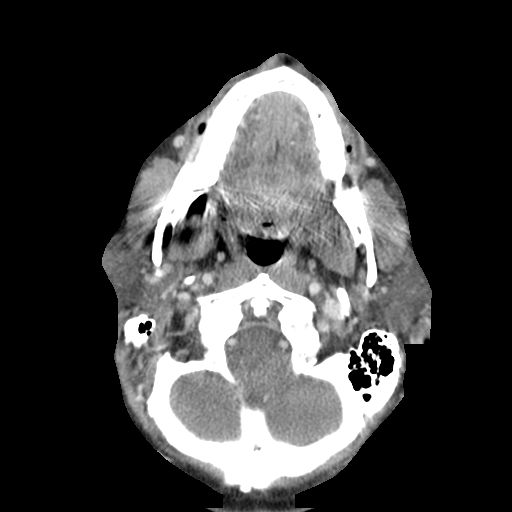
[im 114/137  bone]
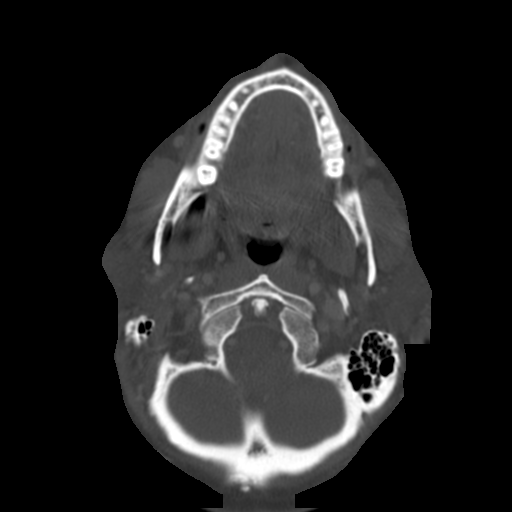

[16 of 33 positions shown; findings below may reference images not displayed]

FINDINGS: Pharynx and larynx: The nasopharynx, oropharynx, oral cavity, and
larynx are unremarkable.

Salivary glands: Submandibular glands and left parotid gland are
unremarkable. A skin marker overlies the right parotid gland to
indicate the area of palpable concern. Within the superficial aspect
of the right parotid gland is a 1.5 x 1.5 cm mass which is mildly
hyperattenuating relative to the adjacent parotid parenchyma with
enhancement most prominent peripherally in thin rim. Stylomastoid
foramen is normal in appearance.

Thyroid: Unremarkable.

Lymph nodes: Mildly prominent submandibular lymph nodes measure up
to 9 mm in short axis on the right and 11 mm on the left but
maintain a normal fatty hilum. Small bilateral level II lymph nodes,
right larger than left, also demonstrate fatty hila.

Vascular: Major vascular structures of the neck appear patent with
mild, scattered atherosclerosis noted.

Limited intracranial: Visualized portion of the brain is
unremarkable.

Mastoids and visualized paranasal sinuses: Minimal mucosal
thickening and trace fluid in the left maxillary sinus. Visualized
mastoid air cells are clear.

Skeleton: Moderate disc degeneration at C6-7.

Upper chest: Visualized lung apices are clear.
IMPRESSION: 1.5 cm right parotid mass. The appearance and provided history are
most suggestive of a benign primary parotid neoplasm, however
malignancy cannot be excluded by imaging.

## 2015-08-14 DIAGNOSIS — F4321 Adjustment disorder with depressed mood: Secondary | ICD-10-CM | POA: Diagnosis not present

## 2015-08-29 ENCOUNTER — Telehealth: Payer: Self-pay | Admitting: Internal Medicine

## 2015-08-29 NOTE — Telephone Encounter (Signed)
[facility] [place] [facility]  Patient Name: Ricky Kelly  DOB: 17-Feb-1950    Initial Comment Caller states c/o nausea, vomiting, chills, weakness in legs and fever of 99.9.   Nurse Assessment  Nurse: Wayne Sever, RN, Tillie Rung Date/Time (Eastern Time): 08/29/2015 10:58:24 AM  Confirm and document reason for call. If symptomatic, describe symptoms. You must click the next button to save text entered. ---Caller states he has vomited 5 to 6 times since 530am. He states it's yellow bile stuff. He is also having nausea. Caller states both his legs feel weak.  Has the patient traveled out of the country within the last 30 days? ---No  Does the patient have any new or worsening symptoms? ---Yes  Will a triage be completed? ---Yes  Related visit to physician within the last 2 weeks? ---No  Does the PT have any chronic conditions? (i.e. diabetes, asthma, etc.) ---Yes  List chronic conditions. ---Arthritis, Handicapped, Tumor in Neck, Bone Spurs. Budging Disc  Is this a behavioral health or substance abuse call? ---No     Guidelines    Guideline Title Affirmed Question Affirmed Notes  Vomiting [1] MODERATE vomiting (e.g., 3 - 5 times/day) AND [2] age > 72    Final Disposition User   Go to ED Now (or PCP triage) Wayne Sever, RN, [place] states he does not have the energy to go to ED or to office today. He states he will call tomorrow for appointment. Refused to go to ED  Caller also refused to let me try and make him an appointment today.   Referrals  GO TO FACILITY REFUSED   Disagree/Comply: Disagree  Disagree/Comply Reason: Wait and see

## 2015-08-29 NOTE — Telephone Encounter (Signed)
See below

## 2015-08-29 NOTE — Telephone Encounter (Signed)
Ok to call pt in AM aug 10 to see if he changes his mind about need for OV

## 2015-08-30 NOTE — Telephone Encounter (Signed)
Patient states he feels much better today.  Fever has gone away and throwing up stopped yesterday.

## 2015-09-06 DIAGNOSIS — H40013 Open angle with borderline findings, low risk, bilateral: Secondary | ICD-10-CM | POA: Diagnosis not present

## 2015-09-06 DIAGNOSIS — H01003 Unspecified blepharitis right eye, unspecified eyelid: Secondary | ICD-10-CM | POA: Diagnosis not present

## 2015-09-06 DIAGNOSIS — L718 Other rosacea: Secondary | ICD-10-CM | POA: Diagnosis not present

## 2015-09-06 DIAGNOSIS — H1851 Endothelial corneal dystrophy: Secondary | ICD-10-CM | POA: Diagnosis not present

## 2015-09-06 DIAGNOSIS — H04123 Dry eye syndrome of bilateral lacrimal glands: Secondary | ICD-10-CM | POA: Diagnosis not present

## 2015-09-07 ENCOUNTER — Encounter: Payer: Self-pay | Admitting: Internal Medicine

## 2015-09-07 ENCOUNTER — Other Ambulatory Visit: Payer: Medicare Other

## 2015-09-07 ENCOUNTER — Ambulatory Visit (INDEPENDENT_AMBULATORY_CARE_PROVIDER_SITE_OTHER): Payer: Medicare Other | Admitting: Internal Medicine

## 2015-09-07 VITALS — BP 122/80 | HR 89 | Temp 98.0°F | Resp 20 | Wt 166.0 lb

## 2015-09-07 DIAGNOSIS — F329 Major depressive disorder, single episode, unspecified: Secondary | ICD-10-CM

## 2015-09-07 DIAGNOSIS — Z1159 Encounter for screening for other viral diseases: Secondary | ICD-10-CM

## 2015-09-07 DIAGNOSIS — R6889 Other general symptoms and signs: Secondary | ICD-10-CM | POA: Diagnosis not present

## 2015-09-07 DIAGNOSIS — F32A Depression, unspecified: Secondary | ICD-10-CM

## 2015-09-07 DIAGNOSIS — Z0001 Encounter for general adult medical examination with abnormal findings: Secondary | ICD-10-CM | POA: Diagnosis not present

## 2015-09-07 DIAGNOSIS — R220 Localized swelling, mass and lump, head: Secondary | ICD-10-CM

## 2015-09-07 DIAGNOSIS — F411 Generalized anxiety disorder: Secondary | ICD-10-CM

## 2015-09-07 DIAGNOSIS — E785 Hyperlipidemia, unspecified: Secondary | ICD-10-CM

## 2015-09-07 LAB — HEPATITIS C ANTIBODY: HCV AB: NEGATIVE

## 2015-09-07 MED ORDER — ALPRAZOLAM 1 MG PO TABS: 1.0000 mg | ORAL_TABLET | Freq: Every evening | ORAL | 5 refills | Status: DC | PRN

## 2015-09-07 MED ORDER — ATORVASTATIN CALCIUM 10 MG PO TABS: 10.0000 mg | ORAL_TABLET | Freq: Every day | ORAL | 3 refills | Status: DC

## 2015-09-07 NOTE — Progress Notes (Signed)
Subjective:    Patient ID: Ricky Kelly, male    DOB: April 22, 1950, 65 y.o.   MRN: [id number]  HPI  Here for wellness and f/u;  Overall doing ok;  Pt denies Chest pain, worsening SOB, DOE, wheezing, orthopnea, PND, worsening LE edema, palpitations, dizziness or syncope.  Pt denies neurological change such as new headache, facial or extremity weakness.  Pt denies polydipsia, polyuria, or low sugar symptoms. Pt states overall good compliance with treatment and medications, good tolerability, and has been trying to follow appropriate diet. No fever, night sweats, wt loss, loss of appetite, or other constitutional symptoms.  Pt states good ability with ADL's, has low fall risk, home safety reviewed and adequate, no other significant changes in hearing or vision, and only occasionally active with exercise.  Seeing counseling for 2 yrs, and has appt with psychiatry next wk (new pt).  Denies worsening depressive symptoms, suicidal ideation, or panic.   Did see ENT with notation of likely benign mass at right angle of jaw, but is sitting about the 5th nerve lower right face and neck, surgury felt not advised. Had second opinion but could be seen twice so asks for a different ENT.  Also has recurring constipation, thought at one point might be due to lipitor, but did not improve with 3 mo off lipitor.  Right arm PHN pain has finally resolved.  Pt continues to have recurring LBP without change in severity, bowel or bladder change, fever, wt loss,  worsening LE pain/numbness/weakness, gait change or falls. Past Medical History:  Diagnosis Date  . Adjustment disorder 1/10   with depressive symptoms  . Allergic rhinitis   . Anxiety   . Chronic cervical radiculopathy 02/02/2013  . Chronic low back pain 08/12/2010  . Depression   . Hearing loss    sensorineural  bilateral  . Hemorrhoids   . Hyperlipidemia   . Lumbago   . Psoriasis   . Retinal tear    bilateral no surgery   Past Surgical History:    Procedure Laterality Date  . CATARACT EXTRACTION Left   . detatched retina Left    laser surgery  . HEMORRHOID SURGERY      reports that he has never smoked. He does not have any smokeless tobacco history on file. He reports that he does not drink alcohol or use drugs. family history includes Cancer in his father and mother. Allergies  Allergen Reactions  . Allegra [Fexofenadine Hcl] Other (See Comments)    constipation  . Escitalopram Oxalate     constipation  . Naproxen     constipation   Current Outpatient Prescriptions on File Prior to Visit  Medication Sig Dispense Refill  . aspirin 325 MG buffered tablet Take 325 mg by mouth as needed.      . Calcitriol (VECTICAL) 3 MCG/GM cream Apply topically at bedtime.      . dimenhyDRINATE (DRAMAMINE) 50 MG tablet Take 25 mg by mouth daily.    Marland Kitchen ibuprofen (ADVIL,MOTRIN) 200 MG tablet Take 200 mg by mouth as needed.      Ricky Kelly Glycol-Propyl Glycol (SYSTANE OP) Apply to eye 3 (three) times daily.    Ricky Kelly Glycol-Propyl Glycol (SYSTANE) 0.4-0.3 % GEL ophthalmic gel Place 1 application into both eyes.     No current facility-administered medications on file prior to visit.    Review of Systems Constitutional: Negative for increased diaphoresis, or other activity, appetite or siginficant weight change other than noted HENT: Negative for worsening hearing  loss, ear pain, facial swelling, mouth sores and neck stiffness.   Eyes: Negative for other worsening pain, redness or visual disturbance.  Respiratory: Negative for choking or stridor Cardiovascular: Negative for other chest pain and palpitations.  Gastrointestinal: Negative for worsening diarrhea, blood in stool, or abdominal distention Genitourinary: Negative for hematuria, flank pain or change in urine volume.  Musculoskeletal: Negative for myalgias or other joint complaints.  Skin: Negative for other color change and wound or drainage.  Neurological: Negative for syncope  and numbness. other than noted Hematological: Negative for adenopathy. or other swelling Psychiatric/Behavioral: Negative for hallucinations, SI, self-injury, decreased concentration or other worsening agitation.      Objective:   Physical Exam BP 122/80   Pulse 89   Temp 98 F (36.7 C) (Oral)   Resp 20   Wt 166 lb (75.3 kg)   SpO2 97%   BMI 27.20 kg/m  VS noted, severe HOH Constitutional: Pt is oriented to person, place, and time. Appears well-developed and well-nourished, in no significant distress Head: Normocephalic and atraumatic  Eyes: Conjunctivae and EOM are normal. Pupils are equal, round, and reactive to light Right Ear: External ear normal.  Left Ear: External ear normal Nose: Nose normal.  Mouth/Throat: Oropharynx is clear and moist  Neck: Normal range of motion. Neck supple. No JVD present. No tracheal deviation present or significant neck LA or mass except for the nondiscrete swelling/mass no change to the area posterior to the right angle of jaw - nontender Cardiovascular: Normal rate, regular rhythm, normal heart sounds and intact distal pulses.   Pulmonary/Chest: Effort normal and breath sounds without rales or wheezing  Abdominal: Soft. Bowel sounds are normal. NT. No HSM  Musculoskeletal: Normal range of motion. Exhibits no edema Lymphadenopathy: Has no cervical adenopathy.  Neurological: Pt is alert and oriented to person, place, and time. Pt has normal reflexes. No cranial nerve deficit. Motor grossly intact Skin: Skin is warm and dry. No rash noted or new ulcers Psychiatric:  Has normal mood and affect. Behavior is normal.      Assessment & Plan:

## 2015-09-07 NOTE — Progress Notes (Signed)
Pre visit review using our clinic review tool, if applicable. No additional management support is needed unless otherwise documented below in the visit note. 

## 2015-09-07 NOTE — Patient Instructions (Addendum)
Please continue all other medications as before, and refills have been done if requested, including the lipitor and the xanax  Please have the pharmacy call with any other refills you may need.  Please continue your efforts at being more active, low cholesterol diet, and weight control.  You are otherwise up to date with prevention measures today.  Please keep your appointments with your specialists as you may have planned  You will be contacted regarding the referral for: ENT  Please go to the LAB in the Basement (turn left off the elevator) for the tests to be done today  You will be contacted by phone if any changes need to be made immediately.  Otherwise, you will receive a letter about your results with an explanation, but please check with MyChart first.  Please remember to sign up for MyChart if you have not done so, as this will be important to you in the future with finding out test results, communicating by private email, and scheduling acute appointments online when needed.  Please return in 6 months, or sooner if needed

## 2015-09-08 NOTE — Assessment & Plan Note (Addendum)
[place] for referral for repeat opinion to ENT,  to f/u any worsening symptoms or concerns  In addition to the time spent performing CPE, I spent an additional 25 minutes face to face,in which greater than 50% of this time was spent in counseling and coordination of care for patient's acute illness as documented.

## 2015-09-08 NOTE — Assessment & Plan Note (Signed)
Denies SI or HI, for psychiatry f/u as planned,  to f/u any worsening symptoms or concerns

## 2015-09-08 NOTE — Assessment & Plan Note (Signed)
D/w pt, very unlikely constipation is related to lipitor, ok to re-start' Lab Results  Component Value Date   LDLCALC 77 09/12/2014

## 2015-09-08 NOTE — Assessment & Plan Note (Signed)

## 2015-09-08 NOTE — Assessment & Plan Note (Signed)
stable overall by history and exam, and pt to continue medical treatment as before,  to f/u any worsening symptoms or concerns, for xanax refill 

## 2015-09-12 ENCOUNTER — Ambulatory Visit (INDEPENDENT_AMBULATORY_CARE_PROVIDER_SITE_OTHER): Payer: Medicare Other | Admitting: Psychiatry

## 2015-09-12 DIAGNOSIS — F341 Dysthymic disorder: Secondary | ICD-10-CM

## 2015-09-12 MED ORDER — DULOXETINE HCL 20 MG PO CPEP: 20.0000 mg | ORAL_CAPSULE | Freq: Every day | ORAL | 2 refills | Status: DC

## 2015-09-12 NOTE — Progress Notes (Signed)
Psychiatric Initial Adult Assessment   Patient Identification: Ricky Kelly MRN:  [id number] Date of Evaluation:  09/12/2015 Referral Source: Dr. 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 The patient is been divorced since 2011 in a difficult divorce process. He is to children one who lives with him age 25. The patient also has a 67 year old daughter is in college. Presently the patient is not dating with no romance. He is on disability for a number of physical illnesses. The patient has chronic pain. He describes that is really his biggest most profound symptom. Is from neuralgia from shingles. He also describes chronic arthritis. The patient describes daily persistent depression is been present for years. He describes chronic problems of sleep that has been present for years. His appetite is good his energy is fair and has no problems concentrating. He enjoys playing cards online watching TV. He does describe a sense of feeling worthless. His psychomotor functioning is normal. The patient is not suicidal now and has never made an attempt. The patient denies the use of alcohol or any illicit drugs. He's never experienced psychosis. Is difficult to get a clear episode of major depression. He denies any symptoms consistent with mania. He denies any symptoms of generalized anxiety disorder, panic disorder or obsessive-compulsive disorder. The patient is a high school degree and for 30 years work in [place]. The patient denies ever having a psychiatric hospitalization. He has never really been evaluated by a psychiatrist area he's been in one-to-one therapy for the last 2 years where now he sees the therapist about once every other week. In the past he took Xanax for short period time but is not on any psychotropic medicines at this time. At this time the patient  denies persistent anxiety. He is actually functioning fairly well.  History of Present Illness:  Above  Associated Signs/Symptoms: Depression Symptoms:  depressed mood, (Hypo) Manic Symptoms:   Anxiety Symptoms:   Psychotic Symptoms:   PTSD Symptoms:   Past Psychiatric History: Only psychotherapy  Previous Psychotropic Medications: Xanax 1 mg daily  Substance Abuse History in the last 12 months:    Consequences of Substance Abuse:   Past Medical History:  Past Medical History:  Diagnosis Date  . Adjustment disorder 1/10   with depressive symptoms  . Allergic rhinitis   . Anxiety   . Chronic cervical radiculopathy 02/02/2013  . Chronic low back pain 08/12/2010  . Depression   . Hearing loss    sensorineural  bilateral  . Hemorrhoids   . Hyperlipidemia   . Lumbago   . Psoriasis   . Retinal tear    bilateral no surgery    Past Surgical History:  Procedure Laterality Date  . CATARACT EXTRACTION Left   . detatched retina Left    laser surgery  . HEMORRHOID SURGERY      Family Psychiatric History:   Family History:  Family History  Problem Relation Age of Onset  . Cancer Father     lung  . Cancer Mother     bladder  . Diabetes      Social History:   Social History   Social History  . Marital status: Married    Spouse name: N/A  . Number of children: 2  . Years of education: N/A   Occupational History  . disabled Retired   Social History Main Topics  .  Smoking status: Never Smoker  . Smokeless tobacco: Not on file  . Alcohol use No  . Drug use: No  . Sexual activity: Not on file   Other Topics Concern  . Not on file   Social History Narrative  . No narrative on file    Additional Social History:   Allergies:   Allergies  Allergen Reactions  . Allegra [Fexofenadine Hcl] Other (See Comments)    constipation  . Escitalopram Oxalate     constipation  . Naproxen     constipation    Metabolic Disorder Labs: No results found for:  HGBA1C, MPG No results found for: PROLACTIN Lab Results  Component Value Date   CHOL 136 09/12/2014   TRIG 73.0 09/12/2014   HDL 43.70 09/12/2014   CHOLHDL 3 09/12/2014   VLDL 14.6 09/12/2014   LDLCALC 77 09/12/2014   LDLCALC 126 (H) 05/04/2008     Current Medications: Current Outpatient Prescriptions  Medication Sig Dispense Refill  . ALPRAZolam (XANAX) 1 MG tablet Take 1 tablet (1 mg total) by mouth at bedtime as needed for anxiety. 30 tablet 5  . aspirin 325 MG buffered tablet Take 325 mg by mouth as needed.      Marland Kitchen atorvastatin (LIPITOR) 10 MG tablet Take 1 tablet (10 mg total) by mouth daily. 90 tablet 3  . Calcitriol (VECTICAL) 3 MCG/GM cream Apply topically at bedtime.      . dimenhyDRINATE (DRAMAMINE) 50 MG tablet Take 25 mg by mouth daily.    . DULoxetine (CYMBALTA) 20 MG capsule Take 1 capsule (20 mg total) by mouth daily. 30 capsule 2  . ibuprofen (ADVIL,MOTRIN) 200 MG tablet Take 200 mg by mouth as needed.      [name] Glycol-Propyl Glycol (SYSTANE OP) Apply to eye 3 (three) times daily.    [name] Glycol-Propyl Glycol (SYSTANE) 0.4-0.3 % GEL ophthalmic gel Place 1 application into both eyes.     No current facility-administered medications for this visit.     Neurologic: Headache: No Seizure: No Paresthesias:No  Musculoskeletal: Strength & Muscle Tone: decreased Gait & Station: ataxic Patient leans: N/A  Psychiatric Specialty Exam: ROS  There were no vitals taken for this visit.There is no height or weight on file to calculate BMI.  General Appearance: Disheveled  Eye Contact:  Fair  Speech:  Clear and Coherent  Volume:  Normal  Mood:  Dysphoric  Affect:  Appropriate  Thought Process:  Goal Directed  Orientation:  NA  Thought Content:  Logical  Suicidal Thoughts:  No  Homicidal Thoughts:  No  Memory:  NA  Judgement:  Fair  Insight:  Fair  Psychomotor Activity:  Normal  Concentration:    Recall:  [place] of Knowledge:Good  Language:  Good  Akathisia:  No  Handed:  Right  AIMS (if indicated):   Assets:  Desire for Improvement  ADL's:    Cognition:   Sleep:      Treatment Plan Summary: At this time in a close evaluation I do not think this patient actually has major clinical depression. I think he has a chronic dysthymia which is associated with chronic significant pain. He himself acknowledges his biggest concern is his chronic pain. Easily understood that his pain was reduced his mood very much improved. He actually has few vegetative symptoms. He has chronic problems with sleep but is eating well has normal energy and normal concentration. I think it's good it is in therapy and that should continue. My intervention at this  time will be to start him on Cymbalta 20 mg. Reality is his the dose of Cymbalta but has an effect on pain is more like 60 mg were 120 mg. The patient says he is very sensitive to medications. He says he easily gets constipated. I will therefore go very slowly. I shared with him that many medicines may have side effects but only small percentage of people experience them. He wants me that he gets constipated very easily and that once he gets constipated he will discontinue any medication I offered him. This patient is not suicidal. He is functioning fairly well. Is good and is in therapy. I will not be aggressive to think that this patient has clinical depression because I do not think that is the case. This patient to return to see me in 2 months. The possibility of treating him for persistent depression disorder is not out of the question and it is possible to trial some antidepressants in the future but for now Cymbalta slight the best choice. He will continue in one-to-one therapy.   Haskel Schroeder, MD 8/23/20174:54 PM

## 2015-09-14 ENCOUNTER — Telehealth (HOSPITAL_COMMUNITY): Payer: Self-pay

## 2015-09-14 NOTE — Telephone Encounter (Signed)
Patient called and said that when he picked up his prescription for Cymbalta, one of the side effects was constipation. Patient says he does not want to take anything that causes constipation. He states he will not take this medication and would like you to recommend something else. Please review and advise, thank you

## 2015-09-17 ENCOUNTER — Telehealth: Payer: Self-pay | Admitting: Internal Medicine

## 2015-09-17 NOTE — Telephone Encounter (Signed)
Patient called in to advise that the atorvastatin (LIPITOR) 10 MG tablet [id number]   is causing him constipation. He is currently constipated. He states that he is d/c the lipitor at this point. He states please feel free to rx something else if you would like.

## 2015-09-17 NOTE — Telephone Encounter (Signed)
Please confirm with patient regarding the constipation  At last visit, he stated the constipation DID NOT CHANGE with a lipitor holiday he took on his own for approx 1 mo  I would only think he needs a change if the constipation resolved with stopping the lipitor for 3 wks starting now  Otherwise, ok to continue the lipitor as we discussed at last visit  Remember, pt has severe Hard of hearing

## 2015-09-18 NOTE — Telephone Encounter (Signed)
Called patient left message to give us a call back 

## 2015-09-19 ENCOUNTER — Telehealth: Payer: Self-pay

## 2015-09-19 NOTE — Telephone Encounter (Signed)
Please advise patient is wanting to know what he can do about his constipation.

## 2015-09-19 NOTE — Telephone Encounter (Signed)
LEft message for patient to give [place] a call back, unable to reach.

## 2015-09-19 NOTE — Telephone Encounter (Signed)
I would take miralax 17 gm by mouth every day, as well as colace 100 mg twice per day  Also, additional help can be found with dulcolox 10 mg otc as needed,senakot nightly, or magnesium citrate 1 bottle per wk as needed

## 2015-09-25 DIAGNOSIS — K119 Disease of salivary gland, unspecified: Secondary | ICD-10-CM | POA: Diagnosis not present

## 2015-09-26 DIAGNOSIS — R221 Localized swelling, mass and lump, neck: Secondary | ICD-10-CM | POA: Diagnosis not present

## 2015-09-26 DIAGNOSIS — R600 Localized edema: Secondary | ICD-10-CM | POA: Diagnosis not present

## 2015-09-26 DIAGNOSIS — K119 Disease of salivary gland, unspecified: Secondary | ICD-10-CM | POA: Diagnosis not present

## 2015-09-27 ENCOUNTER — Other Ambulatory Visit (HOSPITAL_COMMUNITY): Payer: Self-pay

## 2015-09-27 MED ORDER — DESIPRAMINE HCL 10 MG PO TABS: 10.0000 mg | ORAL_TABLET | Freq: Every day | ORAL | 2 refills | Status: DC

## 2015-10-02 ENCOUNTER — Telehealth: Payer: Self-pay | Admitting: Emergency Medicine

## 2015-10-02 NOTE — Telephone Encounter (Signed)
Ok this is noted 

## 2015-10-02 NOTE — Telephone Encounter (Signed)
Patient called back in regard.  Patient stated he was in a lot of pain.  I advised patient to go ahead to ER

## 2015-10-02 NOTE — Telephone Encounter (Signed)
Pt called and stated he his still having a lot of back pain. He would like to knoe if you want him to make an appt, send him for an MRI or refer him to a specialist. Please advise thanks.

## 2015-10-03 ENCOUNTER — Encounter (HOSPITAL_COMMUNITY): Payer: Self-pay

## 2015-10-03 ENCOUNTER — Emergency Department (HOSPITAL_COMMUNITY)
Admission: EM | Admit: 2015-10-03 | Discharge: 2015-10-03 | Disposition: A | Discharge status: Home / Self Care | Payer: Medicare Other | Attending: Emergency Medicine | Admitting: Emergency Medicine

## 2015-10-03 DIAGNOSIS — Z79899 Other long term (current) drug therapy: Secondary | ICD-10-CM | POA: Insufficient documentation

## 2015-10-03 DIAGNOSIS — G8929 Other chronic pain: Secondary | ICD-10-CM | POA: Diagnosis not present

## 2015-10-03 DIAGNOSIS — M545 Low back pain: Secondary | ICD-10-CM | POA: Insufficient documentation

## 2015-10-03 DIAGNOSIS — Z7982 Long term (current) use of aspirin: Secondary | ICD-10-CM | POA: Insufficient documentation

## 2015-10-03 DIAGNOSIS — M549 Dorsalgia, unspecified: Secondary | ICD-10-CM

## 2015-10-03 HISTORY — DX: Unspecified osteoarthritis, unspecified site: M19.90

## 2015-10-03 HISTORY — DX: Zoster without complications: B02.9

## 2015-10-03 HISTORY — DX: Unspecified hearing loss, unspecified ear: H91.90

## 2015-10-03 MED ORDER — METHOCARBAMOL 500 MG PO TABS: 500.0000 mg | ORAL_TABLET | Freq: Two times a day (BID) | ORAL | 0 refills | Status: DC

## 2015-10-03 MED ORDER — HYDROCODONE-ACETAMINOPHEN 5-325 MG PO TABS: 1.0000 | ORAL_TABLET | Freq: Four times a day (QID) | ORAL | 0 refills | Status: DC | PRN

## 2015-10-03 NOTE — ED Notes (Signed)
MD at bedside. 

## 2015-10-03 NOTE — ED Provider Notes (Signed)
[facility] DEPT Provider Note   CSN: [id number] Arrival date & time: 10/03/15  1413  By signing my name below, I, Royce Macadamia, attest that this documentation has been prepared under the direction and in the presence of  Debroah Baller, NP. Electronically Signed: Royce Macadamia, ED Scribe. 10/03/15. 4:17 PM.  History   Chief Complaint Chief Complaint  Patient presents with  . Back Pain   The history is provided by the patient and medical records. No language interpreter was used.     HPI Comments:  Ricky Kelly is a 65 y.o. male with a history of chronic lower back pain who presents to the Emergency Department complaining of acute on chronic lower back pain beginning 10 days ago.  He states states it  would go down to a 6-7/10, but in the last 5 days its been a constant 10/10.  He reports his pain is worse when dressing himself and sitting down or getting up.  He is able to ambulate normally with pain.  He is taking ibuprofen and Tylenol without relief.  His PCP is Curtis Sites; he called his office today and was told to come to the ED for an MRI. He notes his back pain has been present for 10 years, but states that he isn't currently seeing anybody for it and denies being in a pain management clinic. He reports that all pain medications prescribed to him lead to constipation so he stopped taking them.   No additional injury or complain noted.     Past Medical History:  Diagnosis Date  . Adjustment disorder 1/10   with depressive symptoms  . Allergic rhinitis   . Anxiety   . Arthritis   . Chronic cervical radiculopathy 02/02/2013  . Chronic low back pain 08/12/2010  . Depression   . Hearing loss    sensorineural  bilateral  . Hemorrhoids   . HOH (hard of hearing)   . Hyperlipidemia   . Hyperlipidemia   . Lumbago   . Psoriasis   . Retinal tear    bilateral no surgery  . Shingles     Patient Active Problem List   Diagnosis Date Noted  . Swelling, mass,  or lump on face 03/08/2015  . Hearing loss sensory, bilateral 03/08/2015  . PHN (postherpetic neuralgia) 09/05/2014  . Constipation 09/05/2014  . Pain of right thumb 09/05/2014  . Shingles outbreak 07/20/2014  . Cervical radiculopathy, acute 07/18/2014  . Swelling of right side of face 02/15/2014  . Chronic pain syndrome 09/28/2013  . Arthralgia 09/07/2013  . Bilateral hand pain 08/10/2013  . Insomnia 08/10/2013  . Chronic cervical radiculopathy 02/02/2013  . Chronic low back pain 08/12/2010  . Encounter for well adult exam with abnormal findings 08/09/2010  . PSORIASIS 01/11/2010  . Pain in soft tissues of limb 05/31/2009  . HEMORRHOIDS 04/28/2008  . FATIGUE 02/17/2007  . Hyperlipidemia 12/09/2006  . ALLERGIC RHINITIS 12/09/2006  . Anxiety state 09/27/2006  . Depression 09/27/2006  . LOSS, SENSORINEURAL HEARING, BILAT 09/27/2006    Past Surgical History:  Procedure Laterality Date  . CATARACT EXTRACTION Left   . detatched retina Left    laser surgery  . EYE SURGERY    . HEMORRHOID SURGERY         Home Medications    Prior to Admission medications   Medication Sig Start Date End Date Taking? Authorizing Provider  ALPRAZolam Duanne Moron) 1 MG tablet Take 1 tablet (1 mg total) by mouth at bedtime as needed for anxiety.  09/07/15   Biagio Borg, MD  aspirin 325 MG buffered tablet Take 325 mg by mouth as needed.      Historical Provider, MD  atorvastatin (LIPITOR) 10 MG tablet Take 1 tablet (10 mg total) by mouth daily. 09/07/15   Biagio Borg, MD  Calcitriol (VECTICAL) 3 MCG/GM cream Apply topically at bedtime.      Historical Provider, MD  desipramine (NORPRAMIN) 10 MG tablet Take 1 tablet (10 mg total) by mouth daily with breakfast. 09/27/15 09/26/16  Norma Fredrickson, MD  dimenhyDRINATE (DRAMAMINE) 50 MG tablet Take 25 mg by mouth daily.    Historical Provider, MD  DULoxetine (CYMBALTA) 20 MG capsule Take 1 capsule (20 mg total) by mouth daily. 09/12/15 09/11/16  Norma Fredrickson, MD    HYDROcodone-acetaminophen (NORCO) 5-325 MG tablet Take 1 tablet by mouth every 6 (six) hours as needed. 10/03/15   Briane Birden Bunnie Pion, NP  ibuprofen (ADVIL,MOTRIN) 200 MG tablet Take 200 mg by mouth as needed.      Historical Provider, MD  methocarbamol (ROBAXIN) 500 MG tablet Take 1 tablet (500 mg total) by mouth 2 (two) times daily. 10/03/15   Koleman Marling Bunnie Pion, NP  Polyethyl Glycol-Propyl Glycol (SYSTANE OP) Apply to eye 3 (three) times daily.    Historical Provider, MD  Polyethyl Glycol-Propyl Glycol (SYSTANE) 0.4-0.3 % GEL ophthalmic gel Place 1 application into both eyes.    Historical Provider, MD    Family History Family History  Problem Relation Age of Onset  . Cancer Father     lung  . Cancer Mother     bladder  . Diabetes      Social History Social History  Substance Use Topics  . Smoking status: Never Smoker  . Smokeless tobacco: Never Used  . Alcohol use No     Allergies   Allegra [fexofenadine hcl]; Escitalopram oxalate; and Naproxen   Review of Systems Review of Systems  Musculoskeletal: Positive for back pain. Negative for gait problem.  Neurological: Negative for weakness and numbness.     Physical Exam Updated Vital Signs BP 139/89 (BP Location: Right Arm)   Pulse 73   Temp 97.8 F (36.6 C) (Oral)   Resp 20   Ht 5' 5.5" (1.664 m)   Wt 73 kg   SpO2 95%   BMI 26.38 kg/m   Physical Exam  Constitutional: He is oriented to person, place, and time. He appears well-developed and well-nourished. No distress.  HENT:  Head: Normocephalic and atraumatic.  Eyes: EOM are normal.  Neck: Normal range of motion. Neck supple.  Cardiovascular: Normal rate and regular rhythm.   Pulses:      Dorsalis pedis pulses are 2+ on the right side, and 2+ on the left side.  Pulmonary/Chest: Effort normal and breath sounds normal.  Abdominal: Soft. There is no tenderness.  Musculoskeletal: Normal range of motion. He exhibits no edema.       Lumbar back: He exhibits tenderness.  He exhibits normal range of motion, no deformity, no spasm and normal pulse.  No CVA tenderness and no cervical, thoracic or lumbar spine tenderness. Unable to reproduce the pain that the patient has when he moves and walks.   Neurological: He is alert and oriented to person, place, and time. He has normal strength. No sensory deficit.  Reflex Scores:      Bicep reflexes are 2+ on the right side and 2+ on the left side.      Brachioradialis reflexes are 2+ on the right side  and 2+ on the left side.      Patellar reflexes are 2+ on the right side and 2+ on the left side. No foot drag  Skin: Skin is warm and dry.  Psychiatric: He has a normal mood and affect. His behavior is normal.  Nursing note and vitals reviewed.    ED Treatments / Results   DIAGNOSTIC STUDIES:  Oxygen Saturation is 99% on RA, NML by my interpretation.    COORDINATION OF CARE:  4:17 PM Discussed treatment plan with pt at bedside and pt agreed to plan.   Labs (all labs ordered are listed, but only abnormal results are displayed) Labs Reviewed - No data to display  Radiology No results found.  Procedures Procedures (including critical care time)  Medications Ordered in ED Medications - No data to display   Initial Impression / Assessment and Plan / ED Course  I have reviewed the triage vital signs and the nursing notes.  Clinical Course  Dr. Wilson Singer in to discuss with patient why MRI can not be done today. Will treat for muscle spasm and pain. Advised patient to speak with Dr.John to schedule appointment with ortho or MRI.  Patient with back pain.  No neurological deficits. Patient is ambulatory.  He has a h/o cancer No loss of bowel or bladder control.  No concern for cauda equina.  No fever, night sweats, weight loss, IVDA, no recent procedure to back. No urinary symptoms suggestive of UTI. No red flags to indicate need for immediate neuro consult.  Supportive care and return precaution discussed. Appears  safe for discharge at this time. Follow up as indicated in discharge paperwork.   Final Clinical Impressions(s) / ED Diagnoses   Final diagnoses:  Chronic back pain    New Prescriptions Discharge Medication List as of 10/03/2015  4:35 PM    START taking these medications   Details  HYDROcodone-acetaminophen (NORCO) 5-325 MG tablet Take 1 tablet by mouth every 6 (six) hours as needed., Starting Wed 10/03/2015, Print    methocarbamol (ROBAXIN) 500 MG tablet Take 1 tablet (500 mg total) by mouth 2 (two) times daily., Starting Wed 10/03/2015, Print      I personally performed the services described in this documentation, which was scribed in my presence. The recorded information has been reviewed and is accurate.      [place], [place] 10/03/15 1656    Virgel Manifold, MD 10/04/15 1323

## 2015-10-03 NOTE — Discharge Instructions (Signed)
Follow up with Dr. Jenny Reichmann.

## 2015-10-03 NOTE — ED Provider Notes (Signed)
Medical screening examination/treatment/procedure(s) were conducted as a shared visit with non-physician practitioner(s) and myself.  I personally evaluated the patient during the encounter.   EKG Interpretation None       65yM with lower back pain. He came to the ED with the expectation of getting an MRI. Unfortunately, there is no emergent indication. We certainly do testing in the ED as a courtesy sometimes although it's not always necessary.  MRI in particular is a limited resource though. Each case occupies the machine for an extended period of time and it's hard to justify the usage for a non urgent/emergent indication.  He is having pain but no neurological complaints and has a nonfocal neuro exam. He is ambulating in the room. He is taking tylenol/ibuprofen currently and there is certainly other interventions that can be done although he is reluctant to try another other medications. He is understandably upset about the situation. Advised he follow-up with his PCP though.    Virgel Manifold, MD 10/03/15 [phone]

## 2015-10-03 NOTE — ED Triage Notes (Signed)
Pt reports back pain X1 week. Pt reports pain is worse with the act of standing and sitting. Pt is ambulatory to triage. Pt reports intermittent tingling in hands and leg.

## 2015-10-04 ENCOUNTER — Ambulatory Visit (INDEPENDENT_AMBULATORY_CARE_PROVIDER_SITE_OTHER): Payer: Medicare Other | Admitting: Family

## 2015-10-04 ENCOUNTER — Encounter: Payer: Self-pay | Admitting: Family

## 2015-10-04 VITALS — BP 154/80 | HR 75 | Temp 98.6°F | Resp 18 | Ht 65.5 in | Wt 163.0 lb

## 2015-10-04 DIAGNOSIS — G8929 Other chronic pain: Secondary | ICD-10-CM | POA: Diagnosis not present

## 2015-10-04 DIAGNOSIS — M545 Low back pain: Secondary | ICD-10-CM

## 2015-10-04 DIAGNOSIS — Z23 Encounter for immunization: Secondary | ICD-10-CM

## 2015-10-04 MED ORDER — PREDNISONE 10 MG (21) PO TBPK: ORAL_TABLET | ORAL | 0 refills | Status: DC

## 2015-10-04 MED ORDER — TIZANIDINE HCL 4 MG PO TABS: 4.0000 mg | ORAL_TABLET | Freq: Four times a day (QID) | ORAL | 0 refills | Status: DC | PRN

## 2015-10-04 MED ORDER — IBUPROFEN 800 MG PO TABS: 800.0000 mg | ORAL_TABLET | Freq: Three times a day (TID) | ORAL | 0 refills | Status: DC | PRN

## 2015-10-04 NOTE — Patient Instructions (Signed)
Thank you for choosing [facility]!  Ice / moist heat x 20 minutes every 2 hours and as needed or following activity  Ibuprofen as needed for pain 3x daily.  Tizanadine as needed for muscle spasms.   Exercises 1-2 times per day as instructed.   They will call to schedule your MRI.   Your prescription(s) have been submitted to your pharmacy or been printed and provided for you. Please take as directed and contact our office if you believe you are having problem(s) with the medication(s) or have any questions.  If your symptoms worsen or fail to improve, please contact our office for further instruction, or in case of emergency go directly to the emergency room at the closest medical facility.    Low Back Strain With Rehab A strain is an injury in which a tendon or muscle is torn. The muscles and tendons of the lower back are vulnerable to strains. However, these muscles and tendons are very strong and require a great force to be injured. Strains are classified into three categories. Grade 1 strains cause pain, but the tendon is not lengthened. Grade 2 strains include a lengthened ligament, due to the ligament being stretched or partially ruptured. With grade 2 strains there is still function, although the function may be decreased. Grade 3 strains involve a complete tear of the tendon or muscle, and function is usually impaired. SYMPTOMS   Pain in the lower back.  Pain that affects one side more than the other.  Pain that gets worse with movement and may be felt in the hip, buttocks, or back of the thigh.  Muscle spasms of the muscles in the back.  Swelling along the muscles of the back.  Loss of strength of the back muscles.  Crackling sound (crepitation) when the muscles are touched. CAUSES  Lower back strains occur when a force is placed on the muscles or tendons that is greater than they can handle. Common causes of injury include:  Prolonged overuse of the  muscle-tendon units in the lower back, usually from incorrect posture.  A single violent injury or force applied to the back. RISK INCREASES WITH:  Sports that involve twisting forces on the spine or a lot of bending at the waist (football, rugby, weightlifting, bowling, golf, tennis, speed skating, racquetball, swimming, running, gymnastics, diving).  Poor strength and flexibility.  Failure to warm up properly before activity.  Family history of lower back pain or disk disorders.  Previous back injury or surgery (especially fusion).  Poor posture with lifting, especially heavy objects.  Prolonged sitting, especially with poor posture. PREVENTION   Learn and use proper posture when sitting or lifting (maintain proper posture when sitting, lift using the knees and legs, not at the waist).  Warm up and stretch properly before activity.  Allow for adequate recovery between workouts.  Maintain physical fitness:  Strength, flexibility, and endurance.  Cardiovascular fitness. PROGNOSIS  If treated properly, lower back strains usually heal within 6 weeks. RELATED COMPLICATIONS   Recurring symptoms, resulting in a chronic problem.  Chronic inflammation, scarring, and partial muscle-tendon tear.  Delayed healing or resolution of symptoms.  Prolonged disability. TREATMENT  Treatment first involves the use of ice and medicine, to reduce pain and inflammation. The use of strengthening and stretching exercises may help reduce pain with activity. These exercises may be performed at home or with a therapist. Severe injuries may require referral to a therapist for further evaluation and treatment, such as ultrasound. Your caregiver  may advise that you wear a back brace or corset, to help reduce pain and discomfort. Often, prolonged bed rest results in greater harm then benefit. Corticosteroid injections may be recommended. However, these should be reserved for the most serious cases. It is  important to avoid using your back when lifting objects. At night, sleep on your back on a firm mattress with a pillow placed under your knees. If non-surgical treatment is unsuccessful, surgery may be needed.  MEDICATION   If pain medicine is needed, nonsteroidal anti-inflammatory medicines (aspirin and ibuprofen), or other minor pain relievers (acetaminophen), are often advised.  Do not take pain medicine for 7 days before surgery.  Prescription pain relievers may be given, if your caregiver thinks they are needed. Use only as directed and only as much as you need.  Ointments applied to the skin may be helpful.  Corticosteroid injections may be given by your caregiver. These injections should be reserved for the most serious cases, because they may only be given a certain number of times. HEAT AND COLD  Cold treatment (icing) should be applied for 10 to 15 minutes every 2 to 3 hours for inflammation and pain, and immediately after activity that aggravates your symptoms. Use ice packs or an ice massage.  Heat treatment may be used before performing stretching and strengthening activities prescribed by your caregiver, physical therapist, or athletic trainer. Use a heat pack or a warm water soak. SEEK MEDICAL CARE IF:   Symptoms get worse or do not improve in 2 to 4 weeks, despite treatment.  You develop numbness, weakness, or loss of bowel or bladder function.  New, unexplained symptoms develop. (Drugs used in treatment may produce side effects.) EXERCISES  RANGE OF MOTION (ROM) AND STRETCHING EXERCISES - Low Back Strain Most people with lower back pain will find that their symptoms get worse with excessive bending forward (flexion) or arching at the lower back (extension). The exercises which will help resolve your symptoms will focus on the opposite motion.  Your physician, physical therapist or athletic trainer will help you determine which exercises will be most helpful to resolve your  lower back pain. Do not complete any exercises without first consulting with your caregiver. Discontinue any exercises which make your symptoms worse until you speak to your caregiver.  If you have pain, numbness or tingling which travels down into your buttocks, leg or foot, the goal of the therapy is for these symptoms to move closer to your back and eventually resolve. Sometimes, these leg symptoms will get better, but your lower back pain may worsen. This is typically an indication of progress in your rehabilitation. Be very alert to any changes in your symptoms and the activities in which you participated in the 24 hours prior to the change. Sharing this information with your caregiver will allow him/her to most efficiently treat your condition.  These exercises may help you when beginning to rehabilitate your injury. Your symptoms may resolve with or without further involvement from your physician, physical therapist or athletic trainer. While completing these exercises, remember:  Restoring tissue flexibility helps normal motion to return to the joints. This allows healthier, less painful movement and activity.  An effective stretch should be held for at least 30 seconds.  A stretch should never be painful. You should only feel a gentle lengthening or release in the stretched tissue. FLEXION RANGE OF MOTION AND STRETCHING EXERCISES: STRETCH - Flexion, Single Knee to Chest   Lie on a firm bed or  floor with both legs extended in front of you.  Keeping one leg in contact with the floor, bring your opposite knee to your chest. Hold your leg in place by either grabbing behind your thigh or at your knee.  Pull until you feel a gentle stretch in your lower back. Hold __________ seconds.  Slowly release your grasp and repeat the exercise with the opposite side. Repeat __________ times. Complete this exercise __________ times per day.  STRETCH - Flexion, Double Knee to Chest   Lie on a firm bed  or floor with both legs extended in front of you.  Keeping one leg in contact with the floor, bring your opposite knee to your chest.  Tense your stomach muscles to support your back and then lift your other knee to your chest. Hold your legs in place by either grabbing behind your thighs or at your knees.  Pull both knees toward your chest until you feel a gentle stretch in your lower back. Hold __________ seconds.  Tense your stomach muscles and slowly return one leg at a time to the floor. Repeat __________ times. Complete this exercise __________ times per day.  STRETCH - Low Trunk Rotation  Lie on a firm bed or floor. Keeping your legs in front of you, bend your knees so they are both pointed toward the ceiling and your feet are flat on the floor.  Extend your arms out to the side. This will stabilize your upper body by keeping your shoulders in contact with the floor.  Gently and slowly drop both knees together to one side until you feel a gentle stretch in your lower back. Hold for __________ seconds.  Tense your stomach muscles to support your lower back as you bring your knees back to the starting position. Repeat the exercise to the other side. Repeat __________ times. Complete this exercise __________ times per day  EXTENSION RANGE OF MOTION AND FLEXIBILITY EXERCISES: STRETCH - Extension, Prone on Elbows   Lie on your stomach on the floor, a bed will be too soft. Place your palms about shoulder width apart and at the height of your head.  Place your elbows under your shoulders. If this is too painful, stack pillows under your chest.  Allow your body to relax so that your hips drop lower and make contact more completely with the floor.  Hold this position for __________ seconds.  Slowly return to lying flat on the floor. Repeat __________ times. Complete this exercise __________ times per day.  RANGE OF MOTION - Extension, Prone Press Ups  Lie on your stomach on the  floor, a bed will be too soft. Place your palms about shoulder width apart and at the height of your head.  Keeping your back as relaxed as possible, slowly straighten your elbows while keeping your hips on the floor. You may adjust the placement of your hands to maximize your comfort. As you gain motion, your hands will come more underneath your shoulders.  Hold this position __________ seconds.  Slowly return to lying flat on the floor. Repeat __________ times. Complete this exercise __________ times per day.  RANGE OF MOTION- Quadruped, Neutral Spine   Assume a hands and knees position on a firm surface. Keep your hands under your shoulders and your knees under your hips. You may place padding under your knees for comfort.  Drop your head and point your tail bone toward the ground below you. This will round out your lower back like an angry  cat. Hold this position for __________ seconds.  Slowly lift your head and release your tail bone so that your back sags into a large arch, like an old horse.  Hold this position for __________ seconds.  Repeat this until you feel limber in your lower back.  Now, find your "sweet spot." This will be the most comfortable position somewhere between the two previous positions. This is your neutral spine. Once you have found this position, tense your stomach muscles to support your lower back.  Hold this position for __________ seconds. Repeat __________ times. Complete this exercise __________ times per day.  STRENGTHENING EXERCISES - Low Back Strain These exercises may help you when beginning to rehabilitate your injury. These exercises should be done near your "sweet spot." This is the neutral, low-back arch, somewhere between fully rounded and fully arched, that is your least painful position. When performed in this safe range of motion, these exercises can be used for people who have either a flexion or extension based injury. These exercises may  resolve your symptoms with or without further involvement from your physician, physical therapist or athletic trainer. While completing these exercises, remember:   Muscles can gain both the endurance and the strength needed for everyday activities through controlled exercises.  Complete these exercises as instructed by your physician, physical therapist or athletic trainer. Increase the resistance and repetitions only as guided.  You may experience muscle soreness or fatigue, but the pain or discomfort you are trying to eliminate should never worsen during these exercises. If this pain does worsen, stop and make certain you are following the directions exactly. If the pain is still present after adjustments, discontinue the exercise until you can discuss the trouble with your caregiver. STRENGTHENING - Deep Abdominals, Pelvic Tilt  Lie on a firm bed or floor. Keeping your legs in front of you, bend your knees so they are both pointed toward the ceiling and your feet are flat on the floor.  Tense your lower abdominal muscles to press your lower back into the floor. This motion will rotate your pelvis so that your tail bone is scooping upwards rather than pointing at your feet or into the floor.  With a gentle tension and even breathing, hold this position for __________ seconds. Repeat __________ times. Complete this exercise __________ times per day.  STRENGTHENING - Abdominals, Crunches   Lie on a firm bed or floor. Keeping your legs in front of you, bend your knees so they are both pointed toward the ceiling and your feet are flat on the floor. Cross your arms over your chest.  Slightly tip your chin down without bending your neck.  Tense your abdominals and slowly lift your trunk high enough to just clear your shoulder blades. Lifting higher can put excessive stress on the lower back and does not further strengthen your abdominal muscles.  Control your return to the starting  position. Repeat __________ times. Complete this exercise __________ times per day.  STRENGTHENING - Quadruped, Opposite UE/LE Lift   Assume a hands and knees position on a firm surface. Keep your hands under your shoulders and your knees under your hips. You may place padding under your knees for comfort.  Find your neutral spine and gently tense your abdominal muscles so that you can maintain this position. Your shoulders and hips should form a rectangle that is parallel with the floor and is not twisted.  Keeping your trunk steady, lift your right hand no higher than your shoulder  and then your left leg no higher than your hip. Make sure you are not holding your breath. Hold this position __________ seconds.  Continuing to keep your abdominal muscles tense and your back steady, slowly return to your starting position. Repeat with the opposite arm and leg. Repeat __________ times. Complete this exercise __________ times per day.  STRENGTHENING - Lower Abdominals, Double Knee Lift  Lie on a firm bed or floor. Keeping your legs in front of you, bend your knees so they are both pointed toward the ceiling and your feet are flat on the floor.  Tense your abdominal muscles to brace your lower back and slowly lift both of your knees until they come over your hips. Be certain not to hold your breath.  Hold __________ seconds. Using your abdominal muscles, return to the starting position in a slow and controlled manner. Repeat __________ times. Complete this exercise __________ times per day.  POSTURE AND BODY MECHANICS CONSIDERATIONS - Low Back Strain Keeping correct posture when sitting, standing or completing your activities will reduce the stress put on different body tissues, allowing injured tissues a chance to heal and limiting painful experiences. The following are general guidelines for improved posture. Your physician or physical therapist will provide you with any instructions specific to  your needs. While reading these guidelines, remember:  The exercises prescribed by your provider will help you have the flexibility and strength to maintain correct postures.  The correct posture provides the best environment for your joints to work. All of your joints have less wear and tear when properly supported by a spine with good posture. This means you will experience a healthier, less painful body.  Correct posture must be practiced with all of your activities, especially prolonged sitting and standing. Correct posture is as important when doing repetitive low-stress activities (typing) as it is when doing a single heavy-load activity (lifting). RESTING POSITIONS Consider which positions are most painful for you when choosing a resting position. If you have pain with flexion-based activities (sitting, bending, stooping, squatting), choose a position that allows you to rest in a less flexed posture. You would want to avoid curling into a fetal position on your side. If your pain worsens with extension-based activities (prolonged standing, working overhead), avoid resting in an extended position such as sleeping on your stomach. Most people will find more comfort when they rest with their spine in a more neutral position, neither too rounded nor too arched. Lying on a non-sagging bed on your side with a pillow between your knees, or on your back with a pillow under your knees will often provide some relief. Keep in mind, being in any one position for a prolonged period of time, no matter how correct your posture, can still lead to stiffness. PROPER SITTING POSTURE In order to minimize stress and discomfort on your spine, you must sit with correct posture. Sitting with good posture should be effortless for a healthy body. Returning to good posture is a gradual process. Many people can work toward this most comfortably by using various supports until they have the flexibility and strength to maintain  this posture on their own. When sitting with proper posture, your ears will fall over your shoulders and your shoulders will fall over your hips. You should use the back of the chair to support your upper back. Your lower back will be in a neutral position, just slightly arched. You may place a small pillow or folded towel at the base of  your lower back for support.  When working at a desk, create an environment that supports good, upright posture. Without extra support, muscles tire, which leads to excessive strain on joints and other tissues. Keep these recommendations in mind: CHAIR:  A chair should be able to slide under your desk when your back makes contact with the back of the chair. This allows you to work closely.  The chair's height should allow your eyes to be level with the upper part of your monitor and your hands to be slightly lower than your elbows. BODY POSITION  Your feet should make contact with the floor. If this is not possible, use a foot rest.  Keep your ears over your shoulders. This will reduce stress on your neck and lower back. INCORRECT SITTING POSTURES  If you are feeling tired and unable to assume a healthy sitting posture, do not slouch or slump. This puts excessive strain on your back tissues, causing more damage and pain. Healthier options include:  Using more support, like a lumbar pillow.  Switching tasks to something that requires you to be upright or walking.  Talking a brief walk.  Lying down to rest in a neutral-spine position. PROLONGED STANDING WHILE SLIGHTLY LEANING FORWARD  When completing a task that requires you to lean forward while standing in one place for a long time, place either foot up on a stationary 2-4 inch high object to help maintain the best posture. When both feet are on the ground, the lower back tends to lose its slight inward curve. If this curve flattens (or becomes too large), then the back and your other joints will experience  too much stress, tire more quickly, and can cause pain. CORRECT STANDING POSTURES Proper standing posture should be assumed with all daily activities, even if they only take a few moments, like when brushing your teeth. As in sitting, your ears should fall over your shoulders and your shoulders should fall over your hips. You should keep a slight tension in your abdominal muscles to brace your spine. Your tailbone should point down to the ground, not behind your body, resulting in an over-extended swayback posture.  INCORRECT STANDING POSTURES  Common incorrect standing postures include a forward head, locked knees and/or an excessive swayback. WALKING Walk with an upright posture. Your ears, shoulders and hips should all line-up. PROLONGED ACTIVITY IN A FLEXED POSITION When completing a task that requires you to bend forward at your waist or lean over a low surface, try to find a way to stabilize 3 out of 4 of your limbs. You can place a hand or elbow on your thigh or rest a knee on the surface you are reaching across. This will provide you more stability so that your muscles do not fatigue as quickly. By keeping your knees relaxed, or slightly bent, you will also reduce stress across your lower back. CORRECT LIFTING TECHNIQUES DO :   Assume a wide stance. This will provide you more stability and the opportunity to get as close as possible to the object which you are lifting.  Tense your abdominals to brace your spine. Bend at the knees and hips. Keeping your back locked in a neutral-spine position, lift using your leg muscles. Lift with your legs, keeping your back straight.  Test the weight of unknown objects before attempting to lift them.  Try to keep your elbows locked down at your sides in order get the best strength from your shoulders when carrying an object.  Always ask for help when lifting heavy or awkward objects. INCORRECT LIFTING TECHNIQUES DO NOT:   Lock your knees when  lifting, even if it is a small object.  Bend and twist. Pivot at your feet or move your feet when needing to change directions.  Assume that you can safely pick up even a paper clip without proper posture.   This information is not intended to replace advice given to you by your health care provider. Make sure you discuss any questions you have with your health care provider.   Document Released: 01/06/2005 Document Revised: 01/27/2014 Document Reviewed: 04/20/2008 Elsevier Interactive Patient Education [employer].

## 2015-10-04 NOTE — Assessment & Plan Note (Signed)
Symptoms and exam consistent with acute exacerbation of chronic low back pain with previous disc pathology noted on MRI. Given worsening, it is reasonable to consider additional MRI is a previous one was approximately 10 years ago. Treat conservatively with ice/moist heat, home exercise therapy as tolerated, and start ibuprofen, tizanidine, and prednisone. Patient declined injections of Toradol and Depo-Medrol today. Follow-up pending MRI results or sooner if needed.

## 2015-10-04 NOTE — Progress Notes (Signed)
Subjective:    Patient ID: Ricky Kelly, male    DOB: 1950-11-05, 65 y.o.   MRN: [id number]  Chief Complaint  Patient presents with  . Back Pain    has had back problems for over 10 years, recently the pain has been at a 10, MRI?    HPI:  Ricky Kelly is a 65 y.o. male who  has a past medical history of Adjustment disorder (1/10); Allergic rhinitis; Anxiety; Arthritis; Chronic cervical radiculopathy (02/02/2013); Chronic low back pain (08/12/2010); Depression; Hearing loss; Hemorrhoids; HOH (hard of hearing); Hyperlipidemia; Hyperlipidemia; Lumbago; Psoriasis; Retinal tear; and Shingles. and presents today   Back pain - This is a chronic problem with exacerbation. Continues to experience low back pain with previous MRI noting a shallow central protrusion at L5-S1 without central canal or foraminal narrowing and mild facet degenerative change of the lumbar spine. He was most recently evaluated in the emergency department with the expectation of getting an MRI. He was noted to be ambulatory and have no neurological complaints and a nonfocal neuro exam. Advised to follow-up with primary care. He was discharged from the emergency department with hydrocodone and Robaxin. He continues to experience associated symptom of pain located in his lumbar spine described as excruciating with a severity of 10 out of 10. Pain is described as sharp pain that is aggrevating when attempting to sit and and attempting to stand. The pain is constant. Modifying factors include Tylenol and motrin which have not helped very much. He does have issues with chronic constipation and does not wish to take any medications that will make him constipation. Has been seen by orthopedics for his neck, he has been dealing with the back pain. There is occasional a tingling sensation in both legs at times. He has been using an electrical heating pad. Denies any saddle anesthesia or changes to bowels.  Allergies  Allergen  Reactions  . Allegra [Fexofenadine Hcl] Other (See Comments)    constipation  . Escitalopram Oxalate     constipation  . Naproxen     constipation      Outpatient Medications Prior to Visit  Medication Sig Dispense Refill  . ALPRAZolam (XANAX) 1 MG tablet Take 1 tablet (1 mg total) by mouth at bedtime as needed for anxiety. 30 tablet 5  . Calcitriol (VECTICAL) 3 MCG/GM cream Apply topically at bedtime.      Marland Kitchen desipramine (NORPRAMIN) 10 MG tablet Take 1 tablet (10 mg total) by mouth daily with breakfast. 30 tablet 2  . dimenhyDRINATE (DRAMAMINE) 50 MG tablet Take 25 mg by mouth daily.    [name] Glycol-Propyl Glycol (SYSTANE OP) Apply to eye 3 (three) times daily.    [name] Glycol-Propyl Glycol (SYSTANE) 0.4-0.3 % GEL ophthalmic gel Place 1 application into both eyes.    Marland Kitchen HYDROcodone-acetaminophen (NORCO) 5-325 MG tablet Take 1 tablet by mouth every 6 (six) hours as needed. 15 tablet 0  . ibuprofen (ADVIL,MOTRIN) 200 MG tablet Take 200 mg by mouth as needed.      . methocarbamol (ROBAXIN) 500 MG tablet Take 1 tablet (500 mg total) by mouth 2 (two) times daily. 20 tablet 0  . aspirin 325 MG buffered tablet Take 325 mg by mouth as needed.      Marland Kitchen atorvastatin (LIPITOR) 10 MG tablet Take 1 tablet (10 mg total) by mouth daily. 90 tablet 3  . DULoxetine (CYMBALTA) 20 MG capsule Take 1 capsule (20 mg total) by mouth daily. 30 capsule 2  No facility-administered medications prior to visit.       Past Surgical History:  Procedure Laterality Date  . CATARACT EXTRACTION Left   . detatched retina Left    laser surgery  . EYE SURGERY    . HEMORRHOID SURGERY        Past Medical History:  Diagnosis Date  . Adjustment disorder 1/10   with depressive symptoms  . Allergic rhinitis   . Anxiety   . Arthritis   . Chronic cervical radiculopathy 02/02/2013  . Chronic low back pain 08/12/2010  . Depression   . Hearing loss    sensorineural  bilateral  . Hemorrhoids   . HOH  (hard of hearing)   . Hyperlipidemia   . Hyperlipidemia   . Lumbago   . Psoriasis   . Retinal tear    bilateral no surgery  . Shingles       Review of Systems  Constitutional: Negative for chills and fever.  Cardiovascular: Negative for leg swelling.  Musculoskeletal: Positive for back pain.  Neurological: Negative for weakness and numbness.      Objective:    BP (!) 154/80 (BP Location: Right Arm, Patient Position: Sitting, Cuff Size: Normal)   Pulse 75   Temp 98.6 F (37 C) (Oral)   Resp 18   Ht 5' 5.5" (1.664 m)   Wt 163 lb (73.9 kg)   SpO2 97%   BMI 26.71 kg/m  Nursing note and vital signs reviewed.  Physical Exam  Constitutional: He is oriented to person, place, and time. He appears well-developed and well-nourished.  HENT:  Head: Normocephalic.  Right Ear: Hearing, tympanic membrane, external ear and ear canal normal.  Left Ear: Hearing, tympanic membrane, external ear and ear canal normal.  Nose: Nose normal.  Mouth/Throat: Uvula is midline, oropharynx is clear and moist and mucous membranes are normal.  Eyes: Conjunctivae and EOM are normal. Pupils are equal, round, and reactive to light.  Neck: Neck supple. No JVD present. No tracheal deviation present. No thyromegaly present.  Cardiovascular: Normal rate, regular rhythm, normal heart sounds and intact distal pulses.   Pulmonary/Chest: Effort normal and breath sounds normal.  Abdominal: Soft. Bowel sounds are normal. He exhibits no distension and no mass. There is no tenderness. There is no rebound and no guarding.  Musculoskeletal: Normal range of motion. He exhibits no edema or tenderness.  Low back - no obvious deformity, discoloration, or edema. Tenderness and spasm located along the lower lumbar spine on bilateral sides of spinous processes. Range of motion is limited in flexion and lateral bending in bilateral directions secondary to pain. Directions are normal. Distal pulses and sensation are intact and  appropriate. Unable test straight leg raises patient is unable to sit.  Lymphadenopathy:    He has no cervical adenopathy.  Neurological: He is alert and oriented to person, place, and time. He has normal reflexes. No cranial nerve deficit. He exhibits normal muscle tone. Coordination normal.  Skin: Skin is warm and dry.  Psychiatric: He has a normal mood and affect. His behavior is normal. Judgment and thought content normal.       Assessment & Plan:   Problem List Items Addressed This Visit      Other   Chronic low back pain - Primary    Symptoms and exam consistent with acute exacerbation of chronic low back pain with previous disc pathology noted on MRI. Given worsening, it is reasonable to consider additional MRI is a previous one was approximately 10  years ago. Treat conservatively with ice/moist heat, home exercise therapy as tolerated, and start ibuprofen, tizanidine, and prednisone. Patient declined injections of Toradol and Depo-Medrol today. Follow-up pending MRI results or sooner if needed.      Relevant Medications   predniSONE (STERAPRED UNI-PAK 21 TAB) 10 MG (21) TBPK tablet   tiZANidine (ZANAFLEX) 4 MG tablet   ibuprofen (ADVIL,MOTRIN) 800 MG tablet   Other Relevant Orders   MR Lumbar Spine Wo Contrast    Other Visit Diagnoses    Encounter for immunization       Relevant Medications   docusate sodium (COLACE) 100 MG capsule   Other Relevant Orders   Flu Vaccine QUAD 36+ mos IM (Completed)       I have discontinued Mr. Schwamberger aspirin, ibuprofen, atorvastatin, DULoxetine, methocarbamol, and HYDROcodone-acetaminophen. I am also having him start on predniSONE, tiZANidine, and ibuprofen. Additionally, I am having him maintain his Calcitriol, Polyethyl Glycol-Propyl Glycol (SYSTANE OP), dimenhyDRINATE, Polyethyl Glycol-Propyl Glycol, ALPRAZolam, desipramine, and docusate sodium.   Meds ordered this encounter  Medications  . docusate sodium (COLACE) 100 MG  capsule    Sig: Take 100 mg by mouth 2 (two) times daily.  . predniSONE (STERAPRED UNI-PAK 21 TAB) 10 MG (21) TBPK tablet    Sig: Take 6 tablets x 1 day, 5 tablets x 1 day, 4 tablets x 1 day, 3 tablets x 1 day, 2 tablets x 1 day, 1 tablet x 1 day    Dispense:  21 tablet    Refill:  0    Order Specific Question:   Supervising Provider    Answer:   Pricilla Holm A [id number]  . tiZANidine (ZANAFLEX) 4 MG tablet    Sig: Take 1 tablet (4 mg total) by mouth every 6 (six) hours as needed for muscle spasms.    Dispense:  30 tablet    Refill:  0    Order Specific Question:   Supervising Provider    Answer:   Pricilla Holm A [id number]  . ibuprofen (ADVIL,MOTRIN) 800 MG tablet    Sig: Take 1 tablet (800 mg total) by mouth every 8 (eight) hours as needed.    Dispense:  60 tablet    Refill:  0    Order Specific Question:   Supervising Provider    Answer:   Pricilla Holm A [id number]     Follow-up: Return in about 3 weeks (around 10/25/2015), or if symptoms worsen or fail to improve.  Mauricio Po, FNP

## 2015-10-10 DIAGNOSIS — H43811 Vitreous degeneration, right eye: Secondary | ICD-10-CM | POA: Diagnosis not present

## 2015-10-10 DIAGNOSIS — H3581 Retinal edema: Secondary | ICD-10-CM | POA: Diagnosis not present

## 2015-10-10 DIAGNOSIS — H33191 Other retinoschisis and retinal cysts, right eye: Secondary | ICD-10-CM | POA: Diagnosis not present

## 2015-10-10 DIAGNOSIS — H35371 Puckering of macula, right eye: Secondary | ICD-10-CM | POA: Diagnosis not present

## 2015-10-11 ENCOUNTER — Other Ambulatory Visit: Payer: Self-pay

## 2015-10-15 ENCOUNTER — Other Ambulatory Visit: Payer: Self-pay

## 2015-10-16 ENCOUNTER — Other Ambulatory Visit: Payer: Self-pay

## 2015-10-16 ENCOUNTER — Ambulatory Visit
Admission: RE | Admit: 2015-10-16 | Discharge: 2015-10-16 | Disposition: A | Discharge status: Home / Self Care | Payer: Medicare Other | Source: Ambulatory Visit | Attending: Family | Admitting: Family

## 2015-10-16 DIAGNOSIS — M545 Low back pain: Principal | ICD-10-CM

## 2015-10-16 DIAGNOSIS — G8929 Other chronic pain: Secondary | ICD-10-CM

## 2015-10-16 DIAGNOSIS — M5126 Other intervertebral disc displacement, lumbar region: Secondary | ICD-10-CM | POA: Diagnosis not present

## 2015-10-24 ENCOUNTER — Encounter: Payer: Self-pay | Admitting: Internal Medicine

## 2015-10-24 ENCOUNTER — Ambulatory Visit (INDEPENDENT_AMBULATORY_CARE_PROVIDER_SITE_OTHER): Payer: Medicare Other | Admitting: Internal Medicine

## 2015-10-24 VITALS — BP 128/72 | HR 80 | Temp 98.4°F | Resp 20 | Wt 166.1 lb

## 2015-10-24 DIAGNOSIS — F329 Major depressive disorder, single episode, unspecified: Secondary | ICD-10-CM

## 2015-10-24 DIAGNOSIS — M545 Low back pain: Secondary | ICD-10-CM

## 2015-10-24 DIAGNOSIS — G8929 Other chronic pain: Secondary | ICD-10-CM | POA: Diagnosis not present

## 2015-10-24 DIAGNOSIS — F32A Depression, unspecified: Secondary | ICD-10-CM

## 2015-10-24 DIAGNOSIS — E785 Hyperlipidemia, unspecified: Secondary | ICD-10-CM

## 2015-10-24 MED ORDER — ROSUVASTATIN CALCIUM 10 MG PO TABS: 10.0000 mg | ORAL_TABLET | Freq: Every day | ORAL | 3 refills | Status: DC

## 2015-10-24 NOTE — Patient Instructions (Addendum)
Ok to try the muscle relaxer and ibuprofen, and warm/moist heat as needed  Please take all new medication as prescribed  - the crestor  Please continue all other medications as before, and refills have been done if requested.  Please have the pharmacy call with any other refills you may need.  Please keep your appointments with your specialists as you may have planned  You will be contacted regarding the referral for: Pain Management

## 2015-10-24 NOTE — Progress Notes (Signed)
Subjective:    Patient ID: Ricky Kelly, male    DOB: [birth year], 65 y.o.   MRN: [id number]  HPI    Here with chornic LBP midline and to the right, severe in recents weeks, constant, mod to severe, worse to bend and even needs help dressing. Nothing really makes better except has to put the right hand on the lower back to walk sometimes.  Has seen sport med in past.  Had recent MRI with lumbar DDD (mild); has nottired the nsaid,  Has not yet tried muscle relaxer., wanted to wait to ask me. Has not been to pain management in past.  Pt denies new neurological symptoms such as new headache, or facial or extremity weakness or numbness.  Denies urinary symptoms such as dysuria, frequency, urgency, flank pain, hematuria or n/v, fever, chills.  Denies worsening reflux, abd pain, dysphagia, n/v, bowel change or blood.  Also now states constipation was better off lipitor, is asking for change as cont's to have difficulty contolling with diet. Denies worsening depressive symptoms, suicidal ideation, or panic; has ongoing anxiety, overall stable per pt Past Medical History:  Diagnosis Date  . Adjustment disorder 1/10   with depressive symptoms  . Allergic rhinitis   . Anxiety   . Arthritis   . Chronic cervical radiculopathy 02/02/2013  . Chronic low back pain 08/12/2010  . Depression   . Hearing loss    sensorineural  bilateral  . Hemorrhoids   . HOH (hard of hearing)   . Hyperlipidemia   . Hyperlipidemia   . Lumbago   . Psoriasis   . Retinal tear    bilateral no surgery  . Shingles    Past Surgical History:  Procedure Laterality Date  . CATARACT EXTRACTION Left   . detatched retina Left    laser surgery  . EYE SURGERY    . HEMORRHOID SURGERY      reports that he has never smoked. He has never used smokeless tobacco. He reports that he does not drink alcohol or use drugs. family history includes Cancer in his father and mother. Allergies  Allergen Reactions  . Allegra [Fexofenadine  Hcl] Other (See Comments)    constipation  . Escitalopram Oxalate     constipation  . Naproxen     constipation   Current Outpatient Prescriptions on File Prior to Visit  Medication Sig Dispense Refill  . ALPRAZolam (XANAX) 1 MG tablet Take 1 tablet (1 mg total) by mouth at bedtime as needed for anxiety. 30 tablet 5  . Calcitriol (VECTICAL) 3 MCG/GM cream Apply topically at bedtime.      . dimenhyDRINATE (DRAMAMINE) 50 MG tablet Take 25 mg by mouth daily.    Marland Kitchen docusate sodium (COLACE) 100 MG capsule Take 100 mg by mouth 2 (two) times daily.    Marland Kitchen ibuprofen (ADVIL,MOTRIN) 800 MG tablet Take 1 tablet (800 mg total) by mouth every 8 (eight) hours as needed. 60 tablet 0  . Polyethyl Glycol-Propyl Glycol (SYSTANE OP) Apply to eye 3 (three) times daily.    [name] Glycol-Propyl Glycol (SYSTANE) 0.4-0.3 % GEL ophthalmic gel Place 1 application into both eyes.    Marland Kitchen tiZANidine (ZANAFLEX) 4 MG tablet Take 1 tablet (4 mg total) by mouth every 6 (six) hours as needed for muscle spasms. 30 tablet 0   No current facility-administered medications on file prior to visit.    Review of Systems  Constitutional: Negative for unusual diaphoresis or night sweats HENT: Negative for ear swelling or  discharge Eyes: Negative for worsening visual haziness  Respiratory: Negative for choking and stridor.   Gastrointestinal: Negative for distension or worsening eructation Genitourinary: Negative for retention or change in urine volume.  Musculoskeletal: Negative for other MSK pain or swelling Skin: Negative for color change and worsening wound Neurological: Negative for tremors and numbness other than noted  Psychiatric/Behavioral: Negative for decreased concentration or agitation other than above       Objective:   Physical Exam BP 128/72   Pulse 80   Temp 98.4 F (36.9 C) (Oral)   Resp 20   Wt 166 lb 2 oz (75.4 kg)   SpO2 98%   BMI 27.22 kg/m  VS noted, very uncomfortable to sit > 10  min Constitutional: Pt appears in no apparent distress HENT: Head: NCAT.  Right Ear: External ear normal.  Left Ear: External ear normal.  Eyes: . Pupils are equal, round, and reactive to light. Conjunctivae and EOM are normal Neck: Normal range of motion. Neck supple.  Cardiovascular: Normal rate and regular rhythm.   Pulmonary/Chest: Effort normal and breath sounds without rales or wheezing.  Abd:  Soft, NT, ND, + BS Spine: chronic diffuse mild lowest lumbar tender without swelling or rash Neurological: Pt is alert. Not confused , motor grossly intact Skin: Skin is warm. No rash, no LE edema Psychiatric: Pt behavior is normal. No agitation.     Assessment & Plan:

## 2015-10-24 NOTE — Progress Notes (Signed)
Pre visit review using our clinic review tool, if applicable. No additional management support is needed unless otherwise documented below in the visit note. 

## 2015-10-27 NOTE — Assessment & Plan Note (Signed)
Chronic stable with some improvement with ibuprofen, add muscle relaxer prn trial, and refer pain management

## 2015-10-27 NOTE — Assessment & Plan Note (Signed)
stable overall by history and exam, recent data reviewed with pt, and pt to continue medical treatment as before,  to f/u any worsening symptoms or concerns Lab Results  Component Value Date   WBC 7.6 09/12/2014   HGB 14.4 09/12/2014   HCT 42.0 09/12/2014   PLT 181.0 09/12/2014   GLUCOSE 155 (H) 09/12/2014   CHOL 136 09/12/2014   TRIG 73.0 09/12/2014   HDL 43.70 09/12/2014   LDLDIRECT 182.7 08/10/2013   LDLCALC 77 09/12/2014   ALT 22 09/12/2014   AST 20 09/12/2014   NA 136 09/12/2014   K 4.5 09/12/2014   CL 100 09/12/2014   CREATININE 0.94 09/12/2014   BUN 15 09/12/2014   CO2 30 09/12/2014   TSH 2.50 09/12/2014   PSA 0.76 09/12/2014

## 2015-10-27 NOTE — Assessment & Plan Note (Signed)
stable overall by history and exam, recent data reviewed with pt, and pt to change lipitor to crestor if ok with insurance,  to f/u any worsening symptoms or concerns Lab Results  Component Value Date   LDLCALC 77 09/12/2014

## 2015-10-29 ENCOUNTER — Telehealth: Payer: Self-pay | Admitting: Internal Medicine

## 2015-10-29 NOTE — Telephone Encounter (Signed)
rosuvastatin (CRESTOR) 10 MG tablet  Patient is requesting a pa on this medication. He said it was going to cost him to much. Please follow up with patient. Thank you.

## 2015-10-29 NOTE — Telephone Encounter (Signed)
Pt states he does not have Rx coverage - this is the reason the medication cost is so high.  Pt states he was requesting name/number for assistance programs.  Contact information given for [employer] financial assistance program

## 2015-11-12 DIAGNOSIS — M545 Low back pain: Secondary | ICD-10-CM | POA: Diagnosis not present

## 2015-11-22 DIAGNOSIS — F4321 Adjustment disorder with depressed mood: Secondary | ICD-10-CM | POA: Diagnosis not present

## 2015-11-26 DIAGNOSIS — M545 Low back pain: Secondary | ICD-10-CM | POA: Diagnosis not present

## 2015-11-26 DIAGNOSIS — G894 Chronic pain syndrome: Secondary | ICD-10-CM | POA: Diagnosis not present

## 2015-11-26 DIAGNOSIS — Z79899 Other long term (current) drug therapy: Secondary | ICD-10-CM | POA: Diagnosis not present

## 2015-11-26 DIAGNOSIS — Z79891 Long term (current) use of opiate analgesic: Secondary | ICD-10-CM | POA: Diagnosis not present

## 2015-11-28 ENCOUNTER — Ambulatory Visit (HOSPITAL_COMMUNITY): Payer: Self-pay | Admitting: Psychiatry

## 2015-12-07 ENCOUNTER — Ambulatory Visit (HOSPITAL_COMMUNITY): Payer: Self-pay | Admitting: Psychiatry

## 2015-12-20 DIAGNOSIS — F4321 Adjustment disorder with depressed mood: Secondary | ICD-10-CM | POA: Diagnosis not present

## 2016-01-23 DIAGNOSIS — F4321 Adjustment disorder with depressed mood: Secondary | ICD-10-CM | POA: Diagnosis not present

## 2016-03-07 ENCOUNTER — Ambulatory Visit (INDEPENDENT_AMBULATORY_CARE_PROVIDER_SITE_OTHER): Payer: Medicare Other | Admitting: Nurse Practitioner

## 2016-03-07 ENCOUNTER — Encounter: Payer: Self-pay | Admitting: Nurse Practitioner

## 2016-03-07 VITALS — BP 120/72 | HR 83 | Temp 98.2°F | Resp 16 | Ht 65.5 in | Wt 167.1 lb

## 2016-03-07 DIAGNOSIS — R21 Rash and other nonspecific skin eruption: Secondary | ICD-10-CM

## 2016-03-07 MED ORDER — TRIAMCINOLONE ACETONIDE 0.5 % EX OINT: 1.0000 "application " | TOPICAL_OINTMENT | Freq: Two times a day (BID) | CUTANEOUS | 0 refills | Status: DC

## 2016-03-07 NOTE — Patient Instructions (Addendum)
Refer to dermatologist if no improvement in 1week. May use benadryl (tablet or cream) prn for itching. He declined vistaril prescription. Avoid scented lotion or creams  Contact Dermatitis Introduction Dermatitis is redness, soreness, and swelling (inflammation) of the skin. Contact dermatitis is a reaction to certain substances that touch the skin. There are two types of contact dermatitis:  Irritant contact dermatitis. This type is caused by something that irritates your skin, such as dry hands from washing them too much. This type does not require previous exposure to the substance for a reaction to occur. This type is more common.  Allergic contact dermatitis. This type is caused by a substance that you are allergic to, such as a nickel allergy or poison ivy. This type only occurs if you have been exposed to the substance (allergen) before. Upon a repeat exposure, your body reacts to the substance. This type is less common. What are the causes? Many different substances can cause contact dermatitis. Irritant contact dermatitis is most commonly caused by exposure to:  Makeup.  Soaps.  Detergents.  Bleaches.  Acids.  Metal salts, such as nickel. Allergic contact dermatitis is most commonly caused by exposure to:  Poisonous plants.  Chemicals.  Jewelry.  Latex.  Medicines.  Preservatives in products, such as clothing. What increases the risk? This condition is more likely to develop in:  People who have jobs that expose them to irritants or allergens.  People who have certain medical conditions, such as asthma or eczema. What are the signs or symptoms? Symptoms of this condition may occur anywhere on your body where the irritant has touched you or is touched by you. Symptoms include:  Dryness or flaking.  Redness.  Cracks.  Itching.  Pain or a burning feeling.  Blisters.  Drainage of small amounts of blood or clear fluid from skin cracks. With allergic  contact dermatitis, there may also be swelling in areas such as the eyelids, mouth, or genitals. How is this diagnosed? This condition is diagnosed with a medical history and physical exam. A patch skin test may be performed to help determine the cause. If the condition is related to your job, you may need to see an occupational medicine specialist. How is this treated? Treatment for this condition includes figuring out what caused the reaction and protecting your skin from further contact. Treatment may also include:  Steroid creams or ointments. Oral steroid medicines may be needed in more severe cases.  Antibiotics or antibacterial ointments, if a skin infection is present.  Antihistamine lotion or an antihistamine taken by mouth to ease itching.  A bandage (dressing). Follow these instructions at home: [place] your skin as needed.  Apply cool compresses to the affected areas.  Try taking a bath with:  Epsom salts. Follow the instructions on the packaging. You can get these at your local pharmacy or grocery store.  Baking soda. Pour a small amount into the bath as directed by your health care provider.  Colloidal oatmeal. Follow the instructions on the packaging. You can get this at your local pharmacy or grocery store.  Try applying baking soda paste to your skin. Stir water into baking soda until it reaches a paste-like consistency.  Do not scratch your skin.  Bathe less frequently, such as every other day.  Bathe in lukewarm water. Avoid using hot water. Medicines  Take or apply over-the-counter and prescription medicines only as told by your health care provider.  If you were prescribed an antibiotic medicine,  take or apply your antibiotic as told by your health care provider. Do not stop using the antibiotic even if your condition starts to improve. General instructions  Keep all follow-up visits as told by your health care provider. This is  important.  Avoid the substance that caused your reaction. If you do not know what caused it, keep a journal to try to track what caused it. Write down:  What you eat.  What cosmetic products you use.  What you drink.  What you wear in the affected area. This includes jewelry.  If you were given a dressing, take care of it as told by your health care provider. This includes when to change and remove it. Contact a health care provider if:  Your condition does not improve with treatment.  Your condition gets worse.  You have signs of infection such as swelling, tenderness, redness, soreness, or warmth in the affected area.  You have a fever.  You have new symptoms. Get help right away if:  You have a severe headache, neck pain, or neck stiffness.  You vomit.  You feel very sleepy.  You notice red streaks coming from the affected area.  Your bone or joint underneath the affected area becomes painful after the skin has healed.  The affected area turns darker.  You have difficulty breathing. This information is not intended to replace advice given to you by your health care provider. Make sure you discuss any questions you have with your health care provider. Document Released: 01/04/2000 Document Revised: 06/14/2015 Document Reviewed: 05/24/2014  2017 [employer]

## 2016-03-07 NOTE — Progress Notes (Signed)
Pre-visit discussion using our clinic review tool. No additional management support is needed unless otherwise documented below in the visit note.  

## 2016-03-07 NOTE — Progress Notes (Signed)
Subjective:  Patient ID: Ricky Kelly, male    DOB: [birth year]  Age: 66 y.o. MRN: [id number]  CC: Urticaria (Both arms and legs- 2 weeks, initially thought as  of dry skin, no new detergent,)   Rash  This is a new problem. The current episode started 1 to 4 weeks ago. The problem is unchanged. Location: bilateral arms and legs. The rash is characterized by itchiness, dryness, redness and scaling. He was exposed to nothing. Pertinent negatives include no anorexia, congestion, cough, diarrhea, facial edema, fatigue, fever, joint pain, nail changes, rhinorrhea, shortness of breath, sore throat or vomiting. Past treatments include moisturizer. The treatment provided no relief. There is no history of allergies, asthma, eczema or varicella. (Psoriasis)    Outpatient Medications Prior to Visit  Medication Sig Dispense Refill  . ALPRAZolam (XANAX) 1 MG tablet Take 1 tablet (1 mg total) by mouth at bedtime as needed for anxiety. 30 tablet 5  . Calcitriol (VECTICAL) 3 MCG/GM cream Apply topically at bedtime.      . dimenhyDRINATE (DRAMAMINE) 50 MG tablet Take 25 mg by mouth daily.    Marland Kitchen docusate sodium (COLACE) 100 MG capsule Take 100 mg by mouth 2 (two) times daily.    Marland Kitchen ibuprofen (ADVIL,MOTRIN) 800 MG tablet Take 1 tablet (800 mg total) by mouth every 8 (eight) hours as needed. 60 tablet 0  . Polyethyl Glycol-Propyl Glycol (SYSTANE OP) Apply to eye 3 (three) times daily.    [name] Glycol-Propyl Glycol (SYSTANE) 0.4-0.3 % GEL ophthalmic gel Place 1 application into both eyes.    Marland Kitchen tiZANidine (ZANAFLEX) 4 MG tablet Take 1 tablet (4 mg total) by mouth every 6 (six) hours as needed for muscle spasms. 30 tablet 0  . rosuvastatin (CRESTOR) 10 MG tablet Take 1 tablet (10 mg total) by mouth daily. 90 tablet 3   No facility-administered medications prior to visit.     ROS See HPI  Objective:  BP 120/72   Pulse 83   Temp 98.2 F (36.8 C) (Oral)   Resp 16   Ht 5' 5.5" (1.664 m)   Wt 167  lb 1.3 oz (75.8 kg)   SpO2 99%   BMI 27.38 kg/m   BP Readings from Last 3 Encounters:  03/07/16 120/72  10/24/15 128/72  10/04/15 (!) 154/80    Wt Readings from Last 3 Encounters:  03/07/16 167 lb 1.3 oz (75.8 kg)  10/24/15 166 lb 2 oz (75.4 kg)  10/04/15 163 lb (73.9 kg)    Physical Exam  Constitutional: He is oriented to person, place, and time. No distress.  Cardiovascular: Normal rate.   Pulmonary/Chest: Effort normal.  Neurological: He is alert and oriented to person, place, and time.  Skin: Skin is warm and dry. Rash noted. Rash is maculopapular. Rash is not vesicular and not urticarial. There is erythema.     Vitals reviewed.   Lab Results  Component Value Date   WBC 7.6 09/12/2014   HGB 14.4 09/12/2014   HCT 42.0 09/12/2014   PLT 181.0 09/12/2014   GLUCOSE 155 (H) 09/12/2014   CHOL 136 09/12/2014   TRIG 73.0 09/12/2014   HDL 43.70 09/12/2014   LDLDIRECT 182.7 08/10/2013   LDLCALC 77 09/12/2014   ALT 22 09/12/2014   AST 20 09/12/2014   NA 136 09/12/2014   K 4.5 09/12/2014   CL 100 09/12/2014   CREATININE 0.94 09/12/2014   BUN 15 09/12/2014   CO2 30 09/12/2014   TSH 2.50 09/12/2014   PSA  0.76 09/12/2014    Mr Lumbar Spine Wo Contrast  Result Date: 10/16/2015 CLINICAL DATA:  Chronic low back pain EXAM: MRI LUMBAR SPINE WITHOUT CONTRAST TECHNIQUE: Multiplanar, multisequence MR imaging of the lumbar spine was performed. No intravenous contrast was administered. COMPARISON:  MRI 10/28/2006 FINDINGS: Segmentation:  Normal Alignment:  Normal Vertebrae:  Negative for fracture or mass.  Normal bone marrow. Conus medullaris: Extends to the L2-3 level. Distal cord and conus appear normal on T2 sagittal and axial scans. There is hyperintense signal diffusely in the cord and conus on STIR imaging which I believe is artifactual as it is not reproduced on other series. The cord and conus are normal on the prior study. Paraspinal and other soft tissues: Paraspinous  muscles normal. No retroperitoneal mass Disc levels: L1-2:  Mild disc bulging L2-3:  Mild disc bulging L3-4:  Mild disc bulging L4-5:  Mild disc bulging L5-S1: Small central disc protrusion unchanged from the prior study. Negative for neural impingement IMPRESSION: Mild lumbar degenerative change without significant stenosis or neural impingement. Mild progression of disc degeneration at L1-2, L2-3, L3-4, and L4-5 since the prior study. Probable artifactual signal within the distal cord and conus medullaris on sagittal STIR imaging. This is not confirmed on sagittal or axial T2. If the patient has myelopathy symptoms, MRI thoracic spine may be helpful for further evaluation of the cord. Electronically Signed   By: Franchot Gallo M.D.   On: 10/16/2015 16:59    Assessment & Plan:   [name] was seen today for urticaria.  Diagnoses and all orders for this visit:  Rash and nonspecific skin eruption -     triamcinolone ointment (KENALOG) 0.5 %; Apply 1 application topically 2 (two) times daily.   I have discontinued [name] rosuvastatin. I am also having him start on triamcinolone ointment. Additionally, I am having him maintain his Calcitriol, Polyethyl Glycol-Propyl Glycol (SYSTANE OP), dimenhyDRINATE, Polyethyl Glycol-Propyl Glycol, ALPRAZolam, docusate sodium, tiZANidine, and ibuprofen.  Meds ordered this encounter  Medications  . triamcinolone ointment (KENALOG) 0.5 %    Sig: Apply 1 application topically 2 (two) times daily.    Dispense:  30 g    Refill:  0    Order Specific Question:   Supervising Provider    Answer:   Cassandria Anger [[id number]]    Follow-up: Return if symptoms worsen or fail to improve.  Wilfred Lacy, NP

## 2016-03-19 ENCOUNTER — Ambulatory Visit (INDEPENDENT_AMBULATORY_CARE_PROVIDER_SITE_OTHER): Payer: Medicare Other | Admitting: Internal Medicine

## 2016-03-19 ENCOUNTER — Encounter: Payer: Self-pay | Admitting: Internal Medicine

## 2016-03-19 VITALS — BP 118/84 | HR 83 | Temp 97.6°F | Ht 66.0 in | Wt 166.0 lb

## 2016-03-19 DIAGNOSIS — F329 Major depressive disorder, single episode, unspecified: Secondary | ICD-10-CM | POA: Diagnosis not present

## 2016-03-19 DIAGNOSIS — F411 Generalized anxiety disorder: Secondary | ICD-10-CM

## 2016-03-19 DIAGNOSIS — N320 Bladder-neck obstruction: Secondary | ICD-10-CM | POA: Diagnosis not present

## 2016-03-19 DIAGNOSIS — G894 Chronic pain syndrome: Secondary | ICD-10-CM | POA: Diagnosis not present

## 2016-03-19 DIAGNOSIS — E785 Hyperlipidemia, unspecified: Secondary | ICD-10-CM

## 2016-03-19 DIAGNOSIS — F32A Depression, unspecified: Secondary | ICD-10-CM

## 2016-03-19 DIAGNOSIS — R739 Hyperglycemia, unspecified: Secondary | ICD-10-CM | POA: Insufficient documentation

## 2016-03-19 MED ORDER — ALPRAZOLAM 1 MG PO TABS: 1.0000 mg | ORAL_TABLET | Freq: Every evening | ORAL | 5 refills | Status: DC | PRN

## 2016-03-19 NOTE — Patient Instructions (Addendum)

## 2016-03-19 NOTE — Assessment & Plan Note (Signed)
stable overall by history and exam, and pt to continue medical treatment as before,  to f/u any worsening symptoms or concerns 

## 2016-03-19 NOTE — Assessment & Plan Note (Signed)
Stable, for cont same tx,  to f/u any worsening symptoms or concerns

## 2016-03-19 NOTE — Assessment & Plan Note (Signed)
stable overall by history and exam, and pt to continue medical treatment as before,  to f/u any worsening symptoms or concerns, for a1c with next labs

## 2016-03-19 NOTE — Assessment & Plan Note (Signed)
stable overall by history and exam, recent data reviewed with pt, and pt to continue medical treatment as before,  to f/u any worsening symptoms or concerns Lab Results  Component Value Date   WBC 7.6 09/12/2014   HGB 14.4 09/12/2014   HCT 42.0 09/12/2014   PLT 181.0 09/12/2014   GLUCOSE 155 (H) 09/12/2014   CHOL 136 09/12/2014   TRIG 73.0 09/12/2014   HDL 43.70 09/12/2014   LDLDIRECT 182.7 08/10/2013   LDLCALC 77 09/12/2014   ALT 22 09/12/2014   AST 20 09/12/2014   NA 136 09/12/2014   K 4.5 09/12/2014   CL 100 09/12/2014   CREATININE 0.94 09/12/2014   BUN 15 09/12/2014   CO2 30 09/12/2014   TSH 2.50 09/12/2014   PSA 0.76 09/12/2014

## 2016-03-19 NOTE — Assessment & Plan Note (Signed)
stable overall by history and exam, recent data reviewed with pt, and pt to continue medical treatment as before,  to f/u any worsening symptoms or concerns Lab Results  Component Value Date   LDLCALC 77 09/12/2014    

## 2016-03-19 NOTE — Progress Notes (Signed)
Subjective:    Patient ID: Ricky Kelly, male    DOB: 02-24-1950, 66 y.o.   MRN: [id number]  HPI  Here for yearly f/u;  Overall doing ok;  Pt denies Chest pain, worsening SOB, DOE, wheezing, orthopnea, PND, worsening LE edema, palpitations, dizziness or syncope.  Pt denies neurological change such as new headache, facial or extremity weakness.  Pt denies polydipsia, polyuria, or low sugar symptoms. Pt states overall good compliance with treatment and medications, good tolerability, and has been trying to follow appropriate diet.  Pt denies worsening depressive symptoms, suicidal ideation or panic. No fever, night sweats, wt loss, loss of appetite, or other constitutional symptoms.  Pt states good ability with ADL's, has low fall risk, home safety reviewed and adequate, no other significant changes in hearing or vision, and only occasionally active with exercise, due to chronic neck and back pain. Remains severe HOH.  And electing not to tx the right jaw mass as likely benign but not guaranteed (20% chance of malignant) but pt declines surgury, has f/u appt later this month; mass has been getting larger Past Medical History:  Diagnosis Date  . Adjustment disorder 1/10   with depressive symptoms  . Allergic rhinitis   . Anxiety   . Arthritis   . Chronic cervical radiculopathy 02/02/2013  . Chronic low back pain 08/12/2010  . Depression   . Hearing loss    sensorineural  bilateral  . Hemorrhoids   . HOH (hard of hearing)   . Hyperlipidemia   . Hyperlipidemia   . Lumbago   . Psoriasis   . Retinal tear    bilateral no surgery  . Shingles    Past Surgical History:  Procedure Laterality Date  . CATARACT EXTRACTION Left   . detatched retina Left    laser surgery  . EYE SURGERY    . HEMORRHOID SURGERY      reports that he has never smoked. He has never used smokeless tobacco. He reports that he does not drink alcohol or use drugs. family history includes Cancer in his father and  mother. Allergies  Allergen Reactions  . Allegra [Fexofenadine Hcl] Other (See Comments)    constipation  . Escitalopram Oxalate     constipation  . Naproxen     constipation   Current Outpatient Prescriptions on File Prior to Visit  Medication Sig Dispense Refill  . Calcitriol (VECTICAL) 3 MCG/GM cream Apply topically at bedtime.      . dimenhyDRINATE (DRAMAMINE) 50 MG tablet Take 25 mg by mouth daily.    Marland Kitchen docusate sodium (COLACE) 100 MG capsule Take 100 mg by mouth 2 (two) times daily.    Marland Kitchen ibuprofen (ADVIL,MOTRIN) 800 MG tablet Take 1 tablet (800 mg total) by mouth every 8 (eight) hours as needed. 60 tablet 0  . Polyethyl Glycol-Propyl Glycol (SYSTANE OP) Apply to eye 3 (three) times daily.    [name] Glycol-Propyl Glycol (SYSTANE) 0.4-0.3 % GEL ophthalmic gel Place 1 application into both eyes.    Marland Kitchen tiZANidine (ZANAFLEX) 4 MG tablet Take 1 tablet (4 mg total) by mouth every 6 (six) hours as needed for muscle spasms. 30 tablet 0  . triamcinolone ointment (KENALOG) 0.5 % Apply 1 application topically 2 (two) times daily. 30 g 0   No current facility-administered medications on file prior to visit.    Review of Systems Constitutional: Negative for increased diaphoresis, or other activity, appetite or siginficant weight change other than noted HENT: Negative for worsening hearing loss,  ear pain, facial swelling, mouth sores and neck stiffness.   Eyes: Negative for other worsening pain, redness or visual disturbance.  Respiratory: Negative for choking or stridor Cardiovascular: Negative for other chest pain and palpitations.  Gastrointestinal: Negative for worsening diarrhea, blood in stool, or abdominal distention Genitourinary: Negative for hematuria, flank pain or change in urine volume.  Musculoskeletal: Negative for myalgias or other joint complaints.  Skin: Negative for other color change and wound or drainage.  Neurological: Negative for syncope and numbness. other than  noted Hematological: Negative for adenopathy. or other swelling Psychiatric/Behavioral: Negative for hallucinations, SI, self-injury, decreased concentration or other worsening agitation.  All other system neg per pt    Objective:   Physical Exam BP 118/84   Pulse 83   Temp 97.6 F (36.4 C)   Ht 5\' 6"  (1.676 m)   Wt 166 lb (75.3 kg)   SpO2 99%   BMI 26.79 kg/m  VS noted,  Constitutional: Pt is oriented to person, place, and time. Appears well-developed and well-nourished, in no significant distress Head: Normocephalic and atraumatic  Eyes: Conjunctivae and EOM are normal. Pupils are equal, round, and reactive to light Right Ear: External ear normal.  Left Ear: External ear normal Nose: Nose normal.  Mouth/Throat: Oropharynx is clear and moist  Neck: Normal range of motion. Neck supple. No JVD present. No tracheal deviation present or significant neck LA or mass Cardiovascular: Normal rate, regular rhythm, normal heart sounds and intact distal pulses.   Pulmonary/Chest: Effort normal and breath sounds without rales or wheezing  Abdominal: Soft. Bowel sounds are normal. NT. No HSM  Musculoskeletal: Normal range of motion. Exhibits no edema Lymphadenopathy: Has no cervical adenopathy.  Neurological: Pt is alert and oriented to person, place, and time. Pt has normal reflexes. No cranial nerve deficit.  Skin: Skin is warm and dry. No rash noted or new ulcers Psychiatric:  Has normal mood and affect. Behavior is normal. 1+ nervous, not depressed affect No other new exam findings    Assessment & Plan:

## 2016-03-20 DIAGNOSIS — Z961 Presence of intraocular lens: Secondary | ICD-10-CM | POA: Diagnosis not present

## 2016-03-20 DIAGNOSIS — H2511 Age-related nuclear cataract, right eye: Secondary | ICD-10-CM | POA: Diagnosis not present

## 2016-03-20 DIAGNOSIS — H33193 Other retinoschisis and retinal cysts, bilateral: Secondary | ICD-10-CM | POA: Diagnosis not present

## 2016-03-20 DIAGNOSIS — H40013 Open angle with borderline findings, low risk, bilateral: Secondary | ICD-10-CM | POA: Diagnosis not present

## 2016-03-20 DIAGNOSIS — H25011 Cortical age-related cataract, right eye: Secondary | ICD-10-CM | POA: Diagnosis not present

## 2016-04-10 ENCOUNTER — Ambulatory Visit (INDEPENDENT_AMBULATORY_CARE_PROVIDER_SITE_OTHER): Payer: Medicare Other | Admitting: Psychology

## 2016-04-10 DIAGNOSIS — F4321 Adjustment disorder with depressed mood: Secondary | ICD-10-CM | POA: Diagnosis not present

## 2016-04-16 DIAGNOSIS — K119 Disease of salivary gland, unspecified: Secondary | ICD-10-CM | POA: Diagnosis not present

## 2016-04-22 ENCOUNTER — Telehealth: Payer: Self-pay | Admitting: Internal Medicine

## 2016-04-22 NOTE — Telephone Encounter (Signed)
Done hardcopy to Shirron  

## 2016-04-22 NOTE — Telephone Encounter (Signed)
Pt called requesting a prescription be sent over to [facility] on [facility] [address] for the new Shingles vaccine-Shingrix. The pt would like to be called when this has been approved and sent.

## 2016-04-23 NOTE — Telephone Encounter (Signed)
Faxed

## 2016-05-01 ENCOUNTER — Telehealth: Payer: Self-pay

## 2016-05-01 MED ORDER — ZOSTER VAC RECOMB ADJUVANTED 50 MCG/0.5ML IM SUSR
0.5000 mL | Freq: Once | INTRAMUSCULAR | 1 refills | Status: AC
Start: 2016-05-01 — End: 2016-05-01

## 2016-05-01 NOTE — Telephone Encounter (Signed)
re-faxed

## 2016-05-01 NOTE — Telephone Encounter (Signed)
Notified patient.

## 2016-05-01 NOTE — Telephone Encounter (Signed)
Patient is requesting script to be refaxed to [facility] on [address].  States he called pharmacy today and they have not gotten it.

## 2016-05-07 ENCOUNTER — Ambulatory Visit (INDEPENDENT_AMBULATORY_CARE_PROVIDER_SITE_OTHER): Payer: Medicare Other | Admitting: Psychology

## 2016-05-07 DIAGNOSIS — F4321 Adjustment disorder with depressed mood: Secondary | ICD-10-CM

## 2016-06-05 ENCOUNTER — Ambulatory Visit: Payer: Medicare Other | Admitting: Psychology

## 2016-07-02 ENCOUNTER — Ambulatory Visit (INDEPENDENT_AMBULATORY_CARE_PROVIDER_SITE_OTHER): Payer: Medicare Other | Admitting: Psychology

## 2016-07-02 DIAGNOSIS — F4321 Adjustment disorder with depressed mood: Secondary | ICD-10-CM | POA: Diagnosis not present

## 2016-07-30 ENCOUNTER — Ambulatory Visit (INDEPENDENT_AMBULATORY_CARE_PROVIDER_SITE_OTHER): Payer: Medicare Other | Admitting: Psychology

## 2016-07-30 DIAGNOSIS — F4321 Adjustment disorder with depressed mood: Secondary | ICD-10-CM

## 2016-09-03 ENCOUNTER — Ambulatory Visit (INDEPENDENT_AMBULATORY_CARE_PROVIDER_SITE_OTHER): Payer: Medicare Other | Admitting: Psychology

## 2016-09-03 DIAGNOSIS — F4321 Adjustment disorder with depressed mood: Secondary | ICD-10-CM | POA: Diagnosis not present

## 2016-09-18 ENCOUNTER — Ambulatory Visit (INDEPENDENT_AMBULATORY_CARE_PROVIDER_SITE_OTHER): Payer: Medicare Other | Admitting: Internal Medicine

## 2016-09-18 ENCOUNTER — Encounter: Payer: Self-pay | Admitting: Internal Medicine

## 2016-09-18 ENCOUNTER — Other Ambulatory Visit (INDEPENDENT_AMBULATORY_CARE_PROVIDER_SITE_OTHER): Payer: Medicare Other

## 2016-09-18 VITALS — BP 108/74 | HR 70 | Temp 97.9°F | Ht 66.0 in | Wt 165.0 lb

## 2016-09-18 DIAGNOSIS — F411 Generalized anxiety disorder: Secondary | ICD-10-CM | POA: Diagnosis not present

## 2016-09-18 DIAGNOSIS — R220 Localized swelling, mass and lump, head: Secondary | ICD-10-CM

## 2016-09-18 DIAGNOSIS — E785 Hyperlipidemia, unspecified: Secondary | ICD-10-CM | POA: Diagnosis not present

## 2016-09-18 DIAGNOSIS — R739 Hyperglycemia, unspecified: Secondary | ICD-10-CM

## 2016-09-18 DIAGNOSIS — B0229 Other postherpetic nervous system involvement: Secondary | ICD-10-CM | POA: Diagnosis not present

## 2016-09-18 DIAGNOSIS — Z1211 Encounter for screening for malignant neoplasm of colon: Secondary | ICD-10-CM

## 2016-09-18 DIAGNOSIS — N320 Bladder-neck obstruction: Secondary | ICD-10-CM | POA: Diagnosis not present

## 2016-09-18 LAB — CBC WITH DIFFERENTIAL/PLATELET
BASOS ABS: 0.1 10*3/uL (ref 0.0–0.1)
BASOS PCT: 0.9 % (ref 0.0–3.0)
EOS ABS: 0.1 10*3/uL (ref 0.0–0.7)
Eosinophils Relative: 1.5 % (ref 0.0–5.0)
HEMATOCRIT: 41.9 % (ref 39.0–52.0)
HEMOGLOBIN: 14.2 g/dL (ref 13.0–17.0)
LYMPHS PCT: 48 % — AB (ref 12.0–46.0)
Lymphs Abs: 2.8 10*3/uL (ref 0.7–4.0)
MCHC: 34 g/dL (ref 30.0–36.0)
MCV: 90.1 fl (ref 78.0–100.0)
Monocytes Absolute: 0.7 10*3/uL (ref 0.1–1.0)
Monocytes Relative: 12.4 % — ABNORMAL HIGH (ref 3.0–12.0)
Neutro Abs: 2.2 10*3/uL (ref 1.4–7.7)
Neutrophils Relative %: 37.2 % — ABNORMAL LOW (ref 43.0–77.0)
Platelets: 181 10*3/uL (ref 150.0–400.0)
RBC: 4.65 Mil/uL (ref 4.22–5.81)
RDW: 12.9 % (ref 11.5–15.5)
WBC: 5.8 10*3/uL (ref 4.0–10.5)

## 2016-09-18 LAB — BASIC METABOLIC PANEL
BUN: 11 mg/dL (ref 6–23)
CHLORIDE: 100 meq/L (ref 96–112)
CO2: 30 mEq/L (ref 19–32)
CREATININE: 0.97 mg/dL (ref 0.40–1.50)
Calcium: 9.3 mg/dL (ref 8.4–10.5)
GFR: 82.81 mL/min (ref >60.00)
Glucose, Bld: 90 mg/dL (ref 70–99)
Potassium: 4.2 mEq/L (ref 3.5–5.1)
Sodium: 135 mEq/L (ref 135–145)

## 2016-09-18 LAB — HEPATIC FUNCTION PANEL
ALT: 17 U/L (ref 0–53)
AST: 21 U/L (ref 0–37)
Albumin: 4.3 g/dL (ref 3.5–5.2)
Alkaline Phosphatase: 44 U/L (ref 39–117)
BILIRUBIN TOTAL: 0.5 mg/dL (ref 0.2–1.2)
Bilirubin, Direct: 0.1 mg/dL (ref 0.0–0.3)
TOTAL PROTEIN: 7.2 g/dL (ref 6.0–8.3)

## 2016-09-18 LAB — URINALYSIS, ROUTINE W REFLEX MICROSCOPIC
BILIRUBIN URINE: NEGATIVE
HGB URINE DIPSTICK: NEGATIVE
Ketones, ur: NEGATIVE
Leukocytes, UA: NEGATIVE
NITRITE: NEGATIVE
Specific Gravity, Urine: <=1.005 — AB (ref 1.000–1.030)
Total Protein, Urine: NEGATIVE
UROBILINOGEN UA: 0.2 (ref 0.0–1.0)
Urine Glucose: NEGATIVE
WBC UA: NONE SEEN — AB (ref 0–?)
pH: 7 (ref 5.0–8.0)

## 2016-09-18 LAB — LIPID PANEL
CHOL/HDL RATIO: 5
CHOLESTEROL: 176 mg/dL (ref 0–200)
HDL: 34.7 mg/dL — AB (ref >39.00)
NONHDL: 141.3
TRIGLYCERIDES: 205 mg/dL — AB (ref 0.0–149.0)
VLDL: 41 mg/dL — ABNORMAL HIGH (ref 0.0–40.0)

## 2016-09-18 LAB — HEMOGLOBIN A1C: Hgb A1c MFr Bld: 5.4 % (ref 4.6–6.5)

## 2016-09-18 LAB — TSH: TSH: 1.95 u[IU]/mL (ref 0.35–4.50)

## 2016-09-18 LAB — LDL CHOLESTEROL, DIRECT: LDL DIRECT: 100 mg/dL

## 2016-09-18 LAB — PSA: PSA: 1.05 ng/mL (ref 0.10–4.00)

## 2016-09-18 MED ORDER — ALPRAZOLAM 1 MG PO TABS: 1.0000 mg | ORAL_TABLET | Freq: Every evening | ORAL | 5 refills | Status: DC | PRN

## 2016-09-18 MED ORDER — IBUPROFEN 800 MG PO TABS: 800.0000 mg | ORAL_TABLET | Freq: Three times a day (TID) | ORAL | 5 refills | Status: DC | PRN

## 2016-09-18 NOTE — Assessment & Plan Note (Signed)
RUE mild recurreing pain x 2 yrs, declines further tx

## 2016-09-18 NOTE — Progress Notes (Signed)
Subjective:    Patient ID: Ricky Kelly, male    DOB: 1950/06/18, 66 y.o.   MRN: [id number]  HPI  Here to f/u; overall doing ok,  Pt denies chest pain, increasing sob or doe, wheezing, orthopnea, PND, increased LE swelling, palpitations, dizziness or syncope.  Pt denies new neurological symptoms such as new headache, or facial or extremity weakness or numbness.  Pt denies polydipsia, polyuria, or low sugar episode.  Pt states overall good compliance with meds, mostly trying to follow appropriate diet, with wt overall stable,  but little exercise however due to chronic back pain as before. Pt continues to have recurring LBP without change in severity, bowel or bladder change, fever, wt loss,  worsening LE pain/numbness/weakness, gait change or falls.  Needs med refills.  Denies worsening depressive symptoms, suicidal ideation, or panic; has ongoing anxiety  Has persistent right angle of jaw mass seeming larger, but he has seen ENT who pt states said he has 80% chance of non malignant, so he is just watching for now Past Medical History:  Diagnosis Date  . Adjustment disorder 1/10   with depressive symptoms  . Allergic rhinitis   . Anxiety   . Arthritis   . Chronic cervical radiculopathy 02/02/2013  . Chronic low back pain 08/12/2010  . Depression   . Hearing loss    sensorineural  bilateral  . Hemorrhoids   . HOH (hard of hearing)   . Hyperlipidemia   . Hyperlipidemia   . Lumbago   . Psoriasis   . Retinal tear    bilateral no surgery  . Shingles    Past Surgical History:  Procedure Laterality Date  . CATARACT EXTRACTION Left   . detatched retina Left    laser surgery  . EYE SURGERY    . HEMORRHOID SURGERY      reports that he has never smoked. He has never used smokeless tobacco. He reports that he does not drink alcohol or use drugs. family history includes Cancer in his father and mother; Diabetes in his unknown relative. Allergies  Allergen Reactions  . Allegra  [Fexofenadine Hcl] Other (See Comments)    constipation  . Escitalopram Oxalate     constipation  . Naproxen     constipation   Current Outpatient Prescriptions on File Prior to Visit  Medication Sig Dispense Refill  . Calcitriol (VECTICAL) 3 MCG/GM cream Apply topically at bedtime.      . dimenhyDRINATE (DRAMAMINE) 50 MG tablet Take 25 mg by mouth daily.    Marland Kitchen docusate sodium (COLACE) 100 MG capsule Take 100 mg by mouth 2 (two) times daily.    [name] Glycol-Propyl Glycol (SYSTANE OP) Apply to eye 3 (three) times daily.    Marland Kitchen tiZANidine (ZANAFLEX) 4 MG tablet Take 1 tablet (4 mg total) by mouth every 6 (six) hours as needed for muscle spasms. 30 tablet 0  . triamcinolone ointment (KENALOG) 0.5 % Apply 1 application topically 2 (two) times daily. 30 g 0   No current facility-administered medications on file prior to visit.    Review of Systems  Constitutional: Negative for other unusual diaphoresis or sweats HENT: Negative for ear discharge or swelling Eyes: Negative for other worsening visual disturbances Respiratory: Negative for stridor or other swelling  Gastrointestinal: Negative for worsening distension or other blood Genitourinary: Negative for retention or other urinary change Musculoskeletal: Negative for other MSK pain or swelling Skin: Negative for color change or other new lesions Neurological: Negative for worsening tremors and  other numbness  Psychiatric/Behavioral: Negative for worsening agitation or other fatigue All other system neg per pt    Objective:   Physical Exam BP 108/74   Pulse 70   Temp 97.9 F (36.6 C) (Oral)   Ht 5\' 6"  (1.676 m)   Wt 165 lb (74.8 kg)   SpO2 100%   BMI 26.63 kg/m  VS noted,  Constitutional: Pt appears in NAD HENT: Head: NCAT.  Right Ear: External ear normal.  Left Ear: External ear normal.  Eyes: . Pupils are equal, round, and reactive to light. Conjunctivae and EOM are normal Right angle of jaw with nondiscrete at least  2-3 cm swelling/firmness likely mass Nose: without d/c or deformity Neck: Neck supple. Gross normal ROM Cardiovascular: Normal rate and regular rhythm.   Pulmonary/Chest: Effort normal and breath sounds without rales or wheezing.  Spine: diffuse lumbar tender at lower lumbar Neurological: Pt is alert. At baseline orientation, motor grossly intact Skin: Skin is warm. No rashes, other new lesions, no LE edema Psychiatric: Pt behavior is normal without agitation  No other exam findings    Assessment & Plan:

## 2016-09-18 NOTE — Patient Instructions (Addendum)
You will be contacted regarding the referral for: colonoscopy  Please continue all other medications as before, and refills have been done if requested.  Please have the pharmacy call with any other refills you may need.  Please continue your efforts at being more active, low cholesterol diet, and weight control.  You are otherwise up to date with prevention measures today.  Please keep your appointments with your specialists as you may have planned  Please go to the LAB in the Basement (turn left off the elevator) for the tests to be done today  You will be contacted by phone if any changes need to be made immediately.  Otherwise, you will receive a letter about your results with an explanation, but please check with MyChart first.  Please remember to sign up for MyChart if you have not done so, as this will be important to you in the future with finding out test results, communicating by private email, and scheduling acute appointments online when needed.  Please return in 6 months, or sooner if needed 

## 2016-09-21 NOTE — Assessment & Plan Note (Signed)
stable overall by history and exam, recent data reviewed with pt, and pt to continue medical treatment as before,  to f/u any worsening symptoms or concerns Lab Results  Component Value Date   LDLCALC 77 09/12/2014

## 2016-09-21 NOTE — Assessment & Plan Note (Signed)
stable overall by history and exam, recent data reviewed with pt, and pt to continue medical treatment as before,  to f/u any worsening symptoms or concerns Lab Results  Component Value Date   HGBA1C 5.4 09/18/2016   

## 2016-09-21 NOTE — Assessment & Plan Note (Signed)
stable overall by history and exam, and pt to continue medical treatment as before,  to f/u any worsening symptoms or concerns 

## 2016-09-24 ENCOUNTER — Encounter: Payer: Self-pay | Admitting: Internal Medicine

## 2016-10-09 ENCOUNTER — Ambulatory Visit (INDEPENDENT_AMBULATORY_CARE_PROVIDER_SITE_OTHER): Payer: Medicare Other | Admitting: Psychology

## 2016-10-09 DIAGNOSIS — F4321 Adjustment disorder with depressed mood: Secondary | ICD-10-CM

## 2016-10-16 ENCOUNTER — Ambulatory Visit (INDEPENDENT_AMBULATORY_CARE_PROVIDER_SITE_OTHER): Payer: Medicare Other | Admitting: General Practice

## 2016-10-16 DIAGNOSIS — Z23 Encounter for immunization: Secondary | ICD-10-CM | POA: Diagnosis not present

## 2016-10-21 DIAGNOSIS — H2511 Age-related nuclear cataract, right eye: Secondary | ICD-10-CM | POA: Diagnosis not present

## 2016-10-21 DIAGNOSIS — H3581 Retinal edema: Secondary | ICD-10-CM | POA: Diagnosis not present

## 2016-10-21 DIAGNOSIS — H35371 Puckering of macula, right eye: Secondary | ICD-10-CM | POA: Diagnosis not present

## 2016-11-06 ENCOUNTER — Ambulatory Visit: Payer: Medicare Other | Admitting: Psychology

## 2016-12-08 DIAGNOSIS — L409 Psoriasis, unspecified: Secondary | ICD-10-CM | POA: Diagnosis not present

## 2016-12-17 ENCOUNTER — Ambulatory Visit (INDEPENDENT_AMBULATORY_CARE_PROVIDER_SITE_OTHER): Payer: Medicare Other | Admitting: Psychology

## 2016-12-17 DIAGNOSIS — F4321 Adjustment disorder with depressed mood: Secondary | ICD-10-CM

## 2016-12-25 ENCOUNTER — Encounter: Payer: Self-pay | Admitting: Internal Medicine

## 2016-12-25 ENCOUNTER — Ambulatory Visit (INDEPENDENT_AMBULATORY_CARE_PROVIDER_SITE_OTHER): Payer: Medicare Other | Admitting: Internal Medicine

## 2016-12-25 VITALS — BP 122/84 | HR 79 | Temp 97.6°F | Resp 16 | Ht 66.0 in | Wt 166.0 lb

## 2016-12-25 DIAGNOSIS — K5904 Chronic idiopathic constipation: Secondary | ICD-10-CM | POA: Diagnosis not present

## 2016-12-25 DIAGNOSIS — K648 Other hemorrhoids: Secondary | ICD-10-CM

## 2016-12-25 DIAGNOSIS — Z1211 Encounter for screening for malignant neoplasm of colon: Secondary | ICD-10-CM

## 2016-12-25 MED ORDER — HYDROCORTISONE ACE-PRAMOXINE 1-1 % RE FOAM: 1.0000 | Freq: Two times a day (BID) | RECTAL | 1 refills | Status: DC

## 2016-12-25 MED ORDER — LINACLOTIDE 145 MCG PO CAPS: 145.0000 ug | ORAL_CAPSULE | Freq: Every day | ORAL | 1 refills | Status: DC

## 2016-12-25 NOTE — Progress Notes (Signed)
Subjective:  Patient ID: Ricky Kelly, male    DOB: 1950/04/25  Age: 66 y.o. MRN: [id number]  CC: Constipation  NEW TO ME  HPI Ricky Kelly presents for chronic constipation.  He tells me he strains with his stools and for the last 6 weeks has been having intermittent anal discomfort when passing a bowel movement.  He has not noticed any blood in his stool and he denies abdominal pain, nausea, or vomiting.  He occasionally takes MiraLAX for symptom relief but says it is not very beneficial.  Outpatient Medications Prior to Visit  Medication Sig Dispense Refill  . ALPRAZolam (XANAX) 1 MG tablet Take 1 tablet (1 mg total) by mouth at bedtime as needed for anxiety. 30 tablet 5  . Calcitriol (VECTICAL) 3 MCG/GM cream Apply topically at bedtime.      . dimenhyDRINATE (DRAMAMINE) 50 MG tablet Take 25 mg by mouth daily.    Marland Kitchen docusate sodium (COLACE) 100 MG capsule Take 100 mg by mouth 2 (two) times daily.    Marland Kitchen ibuprofen (ADVIL,MOTRIN) 800 MG tablet Take 1 tablet (800 mg total) by mouth every 8 (eight) hours as needed. 60 tablet 5  . Polyethyl Glycol-Propyl Glycol (SYSTANE OP) Apply to eye 3 (three) times daily.    Marland Kitchen tiZANidine (ZANAFLEX) 4 MG tablet Take 1 tablet (4 mg total) by mouth every 6 (six) hours as needed for muscle spasms. 30 tablet 0  . triamcinolone ointment (KENALOG) 0.5 % Apply 1 application topically 2 (two) times daily. 30 g 0   No facility-administered medications prior to visit.     ROS Review of Systems  Constitutional: Negative.  Negative for appetite change, diaphoresis, fatigue and unexpected weight change.  HENT: Negative.   Eyes: Negative.   Respiratory: Negative.  Negative for choking and shortness of breath.   Cardiovascular: Negative for chest pain, palpitations and leg swelling.  Gastrointestinal: Positive for constipation and rectal pain. Negative for abdominal pain, anal bleeding, blood in stool, diarrhea, nausea and vomiting.  Endocrine:  Negative.   Genitourinary: Negative.  Negative for difficulty urinating and dysuria.  Musculoskeletal: Negative.   Skin: Negative.  Negative for color change.  Allergic/Immunologic: Negative.   Neurological: Negative.  Negative for dizziness.  Hematological: Negative for adenopathy. Does not bruise/bleed easily.  Psychiatric/Behavioral: Negative.     Objective:  BP 122/84   Pulse 79   Temp 97.6 F (36.4 C) (Oral)   Resp 16   Ht 5\' 6"  (1.676 m)   Wt 166 lb (75.3 kg)   SpO2 99%   BMI 26.79 kg/m   BP Readings from Last 3 Encounters:  12/25/16 122/84  09/18/16 108/74  03/19/16 118/84    Wt Readings from Last 3 Encounters:  12/25/16 166 lb (75.3 kg)  09/18/16 165 lb (74.8 kg)  03/19/16 166 lb (75.3 kg)    Physical Exam  Constitutional: He is oriented to person, place, and time. No distress.  HENT:  Mouth/Throat: Oropharynx is clear and moist. No oropharyngeal exudate.  Eyes: Conjunctivae are normal. Right eye exhibits no discharge. Left eye exhibits no discharge. No scleral icterus.  Neck: Normal range of motion. Neck supple. No JVD present. No thyromegaly present.  Cardiovascular: Normal rate, regular rhythm and normal heart sounds.  No murmur heard. Pulmonary/Chest: Effort normal and breath sounds normal. No respiratory distress. He has no wheezes. He has no rales.  Abdominal: Soft. Bowel sounds are normal. He exhibits no distension and no mass. There is no tenderness. There is no  guarding.  Genitourinary: Prostate normal. Rectal exam shows internal hemorrhoid. Rectal exam shows no external hemorrhoid, no fissure, no mass, no tenderness, anal tone normal and guaiac negative stool. Prostate is not enlarged and not tender.  Genitourinary Comments: He has uncomplicated internal anal hemorrhoids.  Musculoskeletal: Normal range of motion. He exhibits no edema or tenderness.  Lymphadenopathy:    He has no cervical adenopathy.  Neurological: He is alert and oriented to person,  place, and time.  Skin: Skin is warm and dry. He is not diaphoretic.  Psychiatric: He has a normal mood and affect.  Vitals reviewed.   Lab Results  Component Value Date   WBC 5.8 09/18/2016   HGB 14.2 09/18/2016   HCT 41.9 09/18/2016   PLT 181.0 09/18/2016   GLUCOSE 90 09/18/2016   CHOL 176 09/18/2016   TRIG 205.0 (H) 09/18/2016   HDL 34.70 (L) 09/18/2016   LDLDIRECT 100.0 09/18/2016   LDLCALC 77 09/12/2014   ALT 17 09/18/2016   AST 21 09/18/2016   NA 135 09/18/2016   K 4.2 09/18/2016   CL 100 09/18/2016   CREATININE 0.97 09/18/2016   BUN 11 09/18/2016   CO2 30 09/18/2016   TSH 1.95 09/18/2016   PSA 1.05 09/18/2016   HGBA1C 5.4 09/18/2016    Mr Lumbar Spine Wo Contrast  Result Date: 10/16/2015 CLINICAL DATA:  Chronic low back pain EXAM: MRI LUMBAR SPINE WITHOUT CONTRAST TECHNIQUE: Multiplanar, multisequence MR imaging of the lumbar spine was performed. No intravenous contrast was administered. COMPARISON:  MRI 10/28/2006 FINDINGS: Segmentation:  Normal Alignment:  Normal Vertebrae:  Negative for fracture or mass.  Normal bone marrow. Conus medullaris: Extends to the L2-3 level. Distal cord and conus appear normal on T2 sagittal and axial scans. There is hyperintense signal diffusely in the cord and conus on STIR imaging which I believe is artifactual as it is not reproduced on other series. The cord and conus are normal on the prior study. Paraspinal and other soft tissues: Paraspinous muscles normal. No retroperitoneal mass Disc levels: L1-2:  Mild disc bulging L2-3:  Mild disc bulging L3-4:  Mild disc bulging L4-5:  Mild disc bulging L5-S1: Small central disc protrusion unchanged from the prior study. Negative for neural impingement IMPRESSION: Mild lumbar degenerative change without significant stenosis or neural impingement. Mild progression of disc degeneration at L1-2, L2-3, L3-4, and L4-5 since the prior study. Probable artifactual signal within the distal cord and conus  medullaris on sagittal STIR imaging. This is not confirmed on sagittal or axial T2. If the patient has myelopathy symptoms, MRI thoracic spine may be helpful for further evaluation of the cord. Electronically Signed   By: Franchot Gallo M.D.   On: 10/16/2015 16:59    Assessment & Plan:   Trayveon was seen today for constipation.  Diagnoses and all orders for this visit:  Chronic idiopathic constipation- I have reviewed his recent labs and his current medication list and I do not see any secondary causes for constipation.  Will treat for CIC with Linzess. -     linaclotide (LINZESS) 145 MCG CAPS capsule; Take 1 capsule (145 mcg total) by mouth daily before breakfast.  Inflamed internal hemorrhoid- I recommended that he try Proctofoam HC for symptom relief.  Will also be more aggressive about treating the constipation to avoid straining. -     hydrocortisone-pramoxine (PROCTOFOAM-HC) rectal foam; Place 1 applicator rectally 2 (two) times daily.  Screen for colon cancer- I reviewed his chart and do not see any history  of screening for colon cancer.  I have asked him to see GI to undergo a colonoscopy. -     Ambulatory referral to Gastroenterology   I am having Bunyan Brier. Haberkorn start on linaclotide and hydrocortisone-pramoxine. I am also having him maintain his Calcitriol, Polyethyl Glycol-Propyl Glycol (SYSTANE OP), dimenhyDRINATE, docusate sodium, tiZANidine, triamcinolone ointment, ALPRAZolam, and ibuprofen.  Meds ordered this encounter  Medications  . linaclotide (LINZESS) 145 MCG CAPS capsule    Sig: Take 1 capsule (145 mcg total) by mouth daily before breakfast.    Dispense:  90 capsule    Refill:  1  . hydrocortisone-pramoxine (PROCTOFOAM-HC) rectal foam    Sig: Place 1 applicator rectally 2 (two) times daily.    Dispense:  10 g    Refill:  1     Follow-up: No Follow-up on file.  Scarlette Calico, MD

## 2016-12-25 NOTE — Patient Instructions (Signed)
Hemorrhoids Hemorrhoids are swollen veins in and around the rectum or anus. There are two types of hemorrhoids:  Internal hemorrhoids. These occur in the veins that are just inside the rectum. They may poke through to the outside and become irritated and painful.  External hemorrhoids. These occur in the veins that are outside of the anus and can be felt as a painful swelling or hard lump near the anus.  Most hemorrhoids do not cause serious problems, and they can be managed with home treatments such as diet and lifestyle changes. If home treatments do not help your symptoms, procedures can be done to shrink or remove the hemorrhoids. What are the causes? This condition is caused by increased pressure in the anal area. This pressure may result from various things, including:  Constipation.  Straining to have a bowel movement.  Diarrhea.  Pregnancy.  Obesity.  Sitting for long periods of time.  Heavy lifting or other activity that causes you to strain.  Anal sex.  What are the signs or symptoms? Symptoms of this condition include:  Pain.  Anal itching or irritation.  Rectal bleeding.  Leakage of stool (feces).  Anal swelling.  One or more lumps around the anus.  How is this diagnosed? This condition can often be diagnosed through a visual exam. Other exams or tests may also be done, such as:  Examination of the rectal area with a gloved hand (digital rectal exam).  Examination of the anal canal using a small tube (anoscope).  A blood test, if you have lost a significant amount of blood.  A test to look inside the colon (sigmoidoscopy or colonoscopy).  How is this treated? This condition can usually be treated at home. However, various procedures may be done if dietary changes, lifestyle changes, and other home treatments do not help your symptoms. These procedures can help make the hemorrhoids smaller or remove them completely. Some of these procedures involve  surgery, and others do not. Common procedures include:  Rubber band ligation. Rubber bands are placed at the base of the hemorrhoids to cut off the blood supply to them.  Sclerotherapy. Medicine is injected into the hemorrhoids to shrink them.  Infrared coagulation. A type of light energy is used to get rid of the hemorrhoids.  Hemorrhoidectomy surgery. The hemorrhoids are surgically removed, and the veins that supply them are tied off.  Stapled hemorrhoidopexy surgery. A circular stapling device is used to remove the hemorrhoids and use staples to cut off the blood supply to them.  Follow these instructions at home: Eating and drinking  Eat foods that have a lot of fiber in them, such as whole grains, beans, nuts, fruits, and vegetables. Ask your health care provider about taking products that have added fiber (fiber supplements).  Drink enough fluid to keep your urine clear or pale yellow. Managing pain and swelling  Take warm sitz baths for 20 minutes, 3-4 times a day to ease pain and discomfort.  If directed, apply ice to the affected area. Using ice packs between sitz baths may be helpful. ? Put ice in a plastic bag. ? Place a towel between your skin and the bag. ? Leave the ice on for 20 minutes, 2-3 times a day. General instructions  Take over-the-counter and prescription medicines only as told by your health care provider.  Use medicated creams or suppositories as told.  Exercise regularly.  Go to the bathroom when you have the urge to have a bowel movement. Do not wait.    Avoid straining to have bowel movements.  Keep the anal area dry and clean. Use wet toilet paper or moist towelettes after a bowel movement.  Do not sit on the toilet for long periods of time. This increases blood pooling and pain. Contact a health care provider if:  You have increasing pain and swelling that are not controlled by treatment or medicine.  You have uncontrolled bleeding.  You  have difficulty having a bowel movement, or you are unable to have a bowel movement.  You have pain or inflammation outside the area of the hemorrhoids. This information is not intended to replace advice given to you by your health care provider. Make sure you discuss any questions you have with your health care provider. Document Released: 01/04/2000 Document Revised: 06/06/2015 Document Reviewed: 09/20/2014 Elsevier Interactive Patient Education  2017 [employer]  

## 2016-12-27 DIAGNOSIS — Z1211 Encounter for screening for malignant neoplasm of colon: Secondary | ICD-10-CM | POA: Insufficient documentation

## 2017-01-07 ENCOUNTER — Telehealth: Payer: Self-pay | Admitting: Internal Medicine

## 2017-01-07 NOTE — Telephone Encounter (Signed)
Ok to hold on taking the colace as this can also make diarrhea if too strong

## 2017-01-07 NOTE — Telephone Encounter (Signed)
Pt states Linzess caused "terrible diarrhea". Is having hemorrhoids now and is requesting a stool softener. Per pt he has "maxed out the dose of Colace". Taking 3 capsules at bedtime every night and is not effective.

## 2017-01-07 NOTE — Telephone Encounter (Signed)
Copied from [place] [phone]. Topic: General - Other >> Jan 07, 2017  1:00 PM Neva Seat wrote: Pt needs a Rx for a stool softner.  He is having hemorid issues.  He has tried over counter meds that hasn't worked for him.  [facility] [address] [place] [phone]

## 2017-01-07 NOTE — Telephone Encounter (Signed)
Left message for pt. To call back. Chart states that pt. Should be on Linzess and Colace.

## 2017-01-08 ENCOUNTER — Telehealth: Payer: Self-pay | Admitting: Internal Medicine

## 2017-01-08 NOTE — Telephone Encounter (Signed)
I am a bit confused about what he wants, but the Linzess is for chronic constipatoin  If he does not want to take this, ok to try OTC Miralax as directed

## 2017-01-08 NOTE — Telephone Encounter (Signed)
Patient is returning call- he states he has been dealing with chronic constipation for a long time. He quit taking the Linzess due to SE.  Patient's main concern and question is- Is there a prescription stool softener that can be sent in for him- he does not feel that the OTC stool softeners are working any more.He will be home until 1:00 today please call and let him know- call his home phone.

## 2017-01-08 NOTE — Telephone Encounter (Signed)
Called pt, LVM.   

## 2017-01-08 NOTE — Telephone Encounter (Signed)
Called pt, LVM with details.  

## 2017-01-08 NOTE — Telephone Encounter (Signed)
Pt states that he doesn't have diarrhea after stopping the Linzess after 3 days of use. He stated that he has hemorrhoids on and off for 40 years with constipation with very hard stools. He has been using Colace since being diagnosed with hemorrhoids and it no longer works for him. He would like to know if a prescription can be called in for chronic constipation? Please advise. Patient would like to have an answer by 1pm due to errands.   He did confirm that I could leave a VM in his absence with instruction.

## 2017-01-28 ENCOUNTER — Ambulatory Visit (INDEPENDENT_AMBULATORY_CARE_PROVIDER_SITE_OTHER): Payer: Medicare Other | Admitting: Psychology

## 2017-01-28 DIAGNOSIS — F4321 Adjustment disorder with depressed mood: Secondary | ICD-10-CM

## 2017-02-02 ENCOUNTER — Encounter: Payer: Self-pay | Admitting: Internal Medicine

## 2017-02-06 ENCOUNTER — Ambulatory Visit (INDEPENDENT_AMBULATORY_CARE_PROVIDER_SITE_OTHER): Payer: Medicare Other | Admitting: Internal Medicine

## 2017-02-06 ENCOUNTER — Encounter: Payer: Self-pay | Admitting: Internal Medicine

## 2017-02-06 VITALS — BP 110/76 | HR 86 | Temp 98.4°F | Ht 66.0 in | Wt 168.0 lb

## 2017-02-06 DIAGNOSIS — K648 Other hemorrhoids: Secondary | ICD-10-CM | POA: Diagnosis not present

## 2017-02-06 DIAGNOSIS — Z1211 Encounter for screening for malignant neoplasm of colon: Secondary | ICD-10-CM

## 2017-02-06 DIAGNOSIS — R739 Hyperglycemia, unspecified: Secondary | ICD-10-CM

## 2017-02-06 DIAGNOSIS — K5904 Chronic idiopathic constipation: Secondary | ICD-10-CM | POA: Diagnosis not present

## 2017-02-06 MED ORDER — LACTULOSE 20 GM/30ML PO SOLN: ORAL | 5 refills | Status: DC

## 2017-02-06 MED ORDER — HYDROCORTISONE ACETATE 25 MG RE SUPP: 25.0000 mg | Freq: Two times a day (BID) | RECTAL | 1 refills | Status: DC

## 2017-02-06 MED ORDER — HYDROCORTISONE ACETATE 25 MG RE SUPP: 25.0000 mg | Freq: Two times a day (BID) | RECTAL | 0 refills | Status: DC

## 2017-02-06 NOTE — Progress Notes (Signed)
Subjective:    Patient ID: Ricky Kelly, male    DOB: [birth year], 67 y.o.   MRN: [id number]  HPI  Here to f/u after seen per Dr Ronnald Ramp recently, with tx for chronic constipation and Inflamed Int hemorrhoid.  Liness has helped with prn use but tends to have diarrhea on current dose.  Has not tried the lower dose.   Denies worsening reflux, other abd pain, dysphagia, n/v, bowel change or blood, except rectal pain somewhat worsening overall mild to mod, was not able to get the proctofoam due to cost.  Has not had colonoscopy b/c recent referral resulted in wariness and upset of being without food for 1.5 - 2 days for prep.  Pt denies chest pain, increased sob or doe, wheezing, orthopnea, PND, increased LE swelling, palpitations, dizziness or syncope.  Pt denies new neurological symptoms such as new headache, or facial or extremity weakness or numbness  Pt continues to have recurring LBP without change in severity, bowel or bladder change, fever, wt loss,  worsening LE pain/numbness/weakness, gait change or falls.   Pt denies polydipsia, polyuria Past Medical History:  Diagnosis Date  . Adjustment disorder 1/10   with depressive symptoms  . Allergic rhinitis   . Anxiety   . Arthritis   . Chronic cervical radiculopathy 02/02/2013  . Chronic low back pain 08/12/2010  . Depression   . Hearing loss    sensorineural  bilateral  . Hemorrhoids   . HOH (hard of hearing)   . Hyperlipidemia   . Hyperlipidemia   . Lumbago   . Psoriasis   . Retinal tear    bilateral no surgery  . Shingles    Past Surgical History:  Procedure Laterality Date  . CATARACT EXTRACTION Left   . detatched retina Left    laser surgery  . EYE SURGERY    . HEMORRHOID SURGERY      reports that  has never smoked. he has never used smokeless tobacco. He reports that he does not drink alcohol or use drugs. family history includes Cancer in his father and mother; Diabetes in his unknown relative. Allergies  Allergen  Reactions  . Allegra [Fexofenadine Hcl] Other (See Comments)    constipation  . Escitalopram Oxalate     constipation  . Naproxen     constipation   Current Outpatient Medications on File Prior to Visit  Medication Sig Dispense Refill  . ALPRAZolam (XANAX) 1 MG tablet Take 1 tablet (1 mg total) by mouth at bedtime as needed for anxiety. 30 tablet 5  . Calcitriol (VECTICAL) 3 MCG/GM cream Apply topically at bedtime.      . dimenhyDRINATE (DRAMAMINE) 50 MG tablet Take 25 mg by mouth daily.    Marland Kitchen ibuprofen (ADVIL,MOTRIN) 800 MG tablet Take 1 tablet (800 mg total) by mouth every 8 (eight) hours as needed. 60 tablet 5  . Polyethyl Glycol-Propyl Glycol (SYSTANE OP) Apply to eye 3 (three) times daily.    Marland Kitchen tiZANidine (ZANAFLEX) 4 MG tablet Take 1 tablet (4 mg total) by mouth every 6 (six) hours as needed for muscle spasms. 30 tablet 0   No current facility-administered medications on file prior to visit.    Review of Systems  Constitutional: Negative for other unusual diaphoresis or sweats HENT: Negative for ear discharge or swelling Eyes: Negative for other worsening visual disturbances Respiratory: Negative for stridor or other swelling  Gastrointestinal: Negative for worsening distension or other blood Genitourinary: Negative for retention or other urinary change Musculoskeletal: Negative  for other MSK pain or swelling Skin: Negative for color change or other new lesions Neurological: Negative for worsening tremors and other numbness  Psychiatric/Behavioral: Negative for worsening agitation or other fatigue All other system neg per pt    Objective:   Physical Exam BP 110/76   Pulse 86   Temp 98.4 F (36.9 C) (Oral)   Ht 5\' 6"  (1.676 m)   Wt 168 lb (76.2 kg)   SpO2 99%   BMI 27.12 kg/m  VS noted, not ill appearing Constitutional: Pt appears in NAD HENT: Head: NCAT.  Right Ear: External ear normal.  Left Ear: External ear normal.  Eyes: . Pupils are equal, round, and reactive  to light. Conjunctivae and EOM are normal Nose: without d/c or deformity Neck: Neck supple. Gross normal ROM Cardiovascular: Normal rate and regular rhythm.   Pulmonary/Chest: Effort normal and breath sounds without rales or wheezing.  Abd:  Soft, NT, ND, + BS, no organomegaly Neurological: Pt is alert. At baseline orientation, motor grossly intact Skin: Skin is warm. No rashes, other new lesions, no LE edema Psychiatric: Pt behavior is normal without agitation  No other exam findings    Assessment & Plan:

## 2017-02-06 NOTE — Patient Instructions (Signed)
OK to change the Linzess to Lactulose solution twice per day as needed  OK to change the Proctofoam to Anusol HC supp's twice per day as needed  You will be contacted regarding the referral for: Gastroenterology - Dr Carlean Purl  Please continue all other medications as before, and refills have been done if requested.  Please have the pharmacy call with any other refills you may need.  Please keep your appointments with your specialists as you may have planned

## 2017-02-07 NOTE — Assessment & Plan Note (Signed)
Lab Results  Component Value Date   HGBA1C 5.4 09/18/2016  stable overall by history and exam, recent data reviewed with pt, and pt to continue medical treatment as before,  to f/u any worsening symptoms or concerns

## 2017-02-07 NOTE — Assessment & Plan Note (Signed)
Also needs colonoscopy or purpose of screening, defer to GI for this as well

## 2017-02-07 NOTE — Assessment & Plan Note (Signed)
unfortuanately did not tolerate the mid dose samples, does not want the lower dose due to cost, will try lactulose prn for now as otc has not been effective

## 2017-02-07 NOTE — Assessment & Plan Note (Signed)
Proctofoam too expensive, ok for anusol HC asd, will refer GI for further consideration and tx if needed

## 2017-02-18 ENCOUNTER — Encounter: Payer: Self-pay | Admitting: Internal Medicine

## 2017-02-18 ENCOUNTER — Ambulatory Visit (INDEPENDENT_AMBULATORY_CARE_PROVIDER_SITE_OTHER): Payer: Medicare Other | Admitting: Internal Medicine

## 2017-02-18 VITALS — BP 120/80 | HR 88 | Ht 66.0 in | Wt 168.8 lb

## 2017-02-18 DIAGNOSIS — Z1211 Encounter for screening for malignant neoplasm of colon: Secondary | ICD-10-CM

## 2017-02-18 DIAGNOSIS — K602 Anal fissure, unspecified: Secondary | ICD-10-CM

## 2017-02-18 DIAGNOSIS — K6289 Other specified diseases of anus and rectum: Secondary | ICD-10-CM | POA: Diagnosis not present

## 2017-02-18 DIAGNOSIS — K5900 Constipation, unspecified: Secondary | ICD-10-CM

## 2017-02-18 MED ORDER — DILTIAZEM GEL 2 %: CUTANEOUS | 2 refills | Status: DC

## 2017-02-18 NOTE — Patient Instructions (Signed)
We have sent the following medications to your pharmacy for you to pick up at your convenience:  [facility]  [address] Loletha Grayer [place], [place] [address]  [phone]

## 2017-02-19 ENCOUNTER — Encounter: Payer: Self-pay | Admitting: Internal Medicine

## 2017-02-19 NOTE — Progress Notes (Signed)
HISTORY OF PRESENT ILLNESS:  Ricky Kelly is a 67 y.o. male , divorced and disabled from the [place], who is referred today by his primary care provider Dr. Jenny Reichmann with chief complaint of "constant hemorrhoids and pain". He states that he has had this problem off and on for 30 years but worse over the past 18 months.Patient has chronic constipation. Had been complaining of issues with rectal pain with defecation for some time. He denies bleeding. He was diagnosed with symptomatic hemorrhoids. For his constipation he was placed on linzess which resulted in unacceptable diarrhea. Subsequently changed to lactulose syrup 4 times daily which has improved his bowel habits. Rectal pain was treated with Anusol HC suppositories. This has not helped. He has been encouraged to undergo colon cancer screening but has been resistant.his GI review of systems is otherwise negative. He is a rambling historian with multiple prepared questions. Review of outside blood work from August 2018 shows unremarkable comprehensive metabolic panel and CBC with hemoglobin 14.2. Normal TSH and PSA.MRI 2017 revealed mild degenerative changes in the lower back without stenosis.  REVIEW OF SYSTEMS:  All non-GI ROS negative except for anxiety, arthritis, back pain, depression, hearing problems, muscle cramps, skin rash, sleeping problems, ankle swelling  Past Medical History:  Diagnosis Date  . Adjustment disorder 1/10   with depressive symptoms  . Allergic rhinitis   . Anxiety   . Arthritis   . Chronic cervical radiculopathy 02/02/2013  . Chronic low back pain 08/12/2010  . Depression   . Hearing loss    sensorineural  bilateral  . Hemorrhoids   . HOH (hard of hearing)   . Hyperlipidemia   . Hyperlipidemia   . Lumbago   . Psoriasis   . Retinal tear    bilateral no surgery  . Shingles     Past Surgical History:  Procedure Laterality Date  . CATARACT EXTRACTION Left   . detatched retina Left    laser  surgery  . EYE SURGERY    . [place]  reports that  has never smoked. he has never used smokeless tobacco. He reports that he does not drink alcohol or use drugs.  family history includes Cancer in his father and mother; Diabetes in his unknown relative.  Allergies  Allergen Reactions  . Allegra [Fexofenadine Hcl] Other (See Comments)    constipation  . Escitalopram Oxalate     constipation  . Naproxen     constipation       PHYSICAL EXAMINATION: Vital signs: BP 120/80 (Patient Position: Standing)   Pulse 88   Ht 5\' 6"  (1.676 m)   Wt 168 lb 12.8 oz (76.6 kg)   BMI 27.25 kg/m   Constitutional: slow moving with cane, but otherwise generally well-appearing, no acute distress Psychiatric: alert and oriented x3, cooperative. Eyes: extraocular movements intact, anicteric, conjunctiva pink Mouth: oral pharynx moist, no lesions Neck: supple no lymphadenopathy Cardiovascular: heart regular rate and rhythm, no murmur Lungs: clear to auscultation bilaterally Abdomen: soft, nontender, nondistended, no obvious ascites, no peritoneal signs, normal bowel sounds, no organomegaly Rectal:no significant external hemorrhoids. Lateral fissure on the right which is tender and reproduces his pain. Hemoccult negative stool Extremities: no clubbing, cyanosis, or lower extremity edema bilaterally Skin: no lesions on visible extremities Neuro: No focal deficits. Cranial nerves intact  ASSESSMENT:  #1. Anal fissure #2. Rectal pain secondary to anal fissure #3. Chronic constipation #4. Colon cancer screening. Patient declined  PLAN:  #1. 2% diltiazem ointment prescribed. 5 times daily as directed by myself for 6 weeks #2. Continue lactulose. Add fiber #3. Okay to stop Anusol suppositories at this point #4. Balanced discussion on colon cancer screening strategies. Literature provided on screening colonoscopy. The patient will contemplate and  will discuss with his PCP   A copy of this consultation note has been sent to Dr. Jenny Reichmann

## 2017-03-05 ENCOUNTER — Ambulatory Visit: Payer: Medicare Other | Admitting: Psychology

## 2017-03-16 ENCOUNTER — Ambulatory Visit: Payer: Self-pay | Admitting: Internal Medicine

## 2017-03-20 ENCOUNTER — Ambulatory Visit (INDEPENDENT_AMBULATORY_CARE_PROVIDER_SITE_OTHER): Payer: Medicare Other | Admitting: *Deleted

## 2017-03-20 ENCOUNTER — Encounter: Payer: Self-pay | Admitting: Internal Medicine

## 2017-03-20 ENCOUNTER — Ambulatory Visit (INDEPENDENT_AMBULATORY_CARE_PROVIDER_SITE_OTHER): Payer: Medicare Other | Admitting: Internal Medicine

## 2017-03-20 VITALS — BP 128/84 | HR 88 | Temp 97.7°F | Ht 66.0 in | Wt 165.0 lb

## 2017-03-20 DIAGNOSIS — Z0000 Encounter for general adult medical examination without abnormal findings: Secondary | ICD-10-CM | POA: Diagnosis not present

## 2017-03-20 DIAGNOSIS — R739 Hyperglycemia, unspecified: Secondary | ICD-10-CM

## 2017-03-20 DIAGNOSIS — M79643 Pain in unspecified hand: Secondary | ICD-10-CM

## 2017-03-20 DIAGNOSIS — K5900 Constipation, unspecified: Secondary | ICD-10-CM | POA: Diagnosis not present

## 2017-03-20 DIAGNOSIS — F32A Depression, unspecified: Secondary | ICD-10-CM

## 2017-03-20 DIAGNOSIS — F329 Major depressive disorder, single episode, unspecified: Secondary | ICD-10-CM

## 2017-03-20 MED ORDER — ALPRAZOLAM 1 MG PO TABS: 1.0000 mg | ORAL_TABLET | Freq: Every evening | ORAL | 5 refills | Status: DC | PRN

## 2017-03-20 NOTE — Patient Instructions (Signed)
Please continue all other medications as before, and refills have been done if requested.  Please have the pharmacy call with any other refills you may need.  Please continue your efforts at being more active, low cholesterol diet, and weight control.  You are otherwise up to date with prevention measures today.  Please keep your appointments with your specialists as you may have planned  Please return in 6 months, or sooner if needed 

## 2017-03-20 NOTE — Progress Notes (Addendum)
Subjective:   Ricky Kelly is a 67 y.o. male who presents for Medicare Annual/Subsequent preventive examination.  Review of Systems:  No ROS.  Medicare Wellness Visit. Additional risk factors are reflected in the social history.  Cardiac Risk Factors include: advanced age (>82men, >23 women);dyslipidemia Sleep patterns: has interrupted sleep, has frequent nighttime awakenings, gets up 1-2 times nightly to void and sleeps 3-6 hours nightly, depending on pain issues. Patient reports insomnia issues, discussed recommended sleep tips.    Home Safety/Smoke Alarms: Feels safe in home. Smoke alarms in place.  Living environment; residence and Adult nurse: apartment, equipment: Cane, Type: [place], no firearms. Seat Belt Safety/Bike Helmet: Wears seat belt.   PSA-  Lab Results  Component Value Date   PSA 1.05 09/18/2016   PSA 0.76 09/12/2014   PSA 0.86 08/10/2013       Objective:    Vitals: There were no vitals taken for this visit.  There is no height or weight on file to calculate BMI.  Advanced Directives 10/03/2015  Does Patient Have a Medical Advance Directive? No  Would patient like information on creating a medical advance directive? No - patient declined information    Tobacco Social History   Tobacco Use  Smoking Status Never Smoker  Smokeless Tobacco Never Used     Counseling given: Not Answered  Past Medical History:  Diagnosis Date  . Adjustment disorder 1/10   with depressive symptoms  . Allergic rhinitis   . Anxiety   . Arthritis   . Chronic cervical radiculopathy 02/02/2013  . Chronic low back pain 08/12/2010  . Depression   . Hearing loss    sensorineural  bilateral  . Hemorrhoids   . HOH (hard of hearing)   . Hyperlipidemia   . Hyperlipidemia   . Lumbago   . Psoriasis   . Retinal tear    bilateral no surgery  . Shingles    Past Surgical History:  Procedure Laterality Date  . CATARACT EXTRACTION Left   . detatched retina  Left    laser surgery  . EYE SURGERY    . HEMORRHOID SURGERY     Family History  Problem Relation Age of Onset  . Cancer Father        lung  . Cancer Mother        bladder  . Diabetes Unknown    Social History   Socioeconomic History  . Marital status: Divorced    Spouse name: Not on file  . Number of children: 2  . Years of education: Not on file  . Highest education level: Not on file  Social Needs  . Financial resource strain: Somewhat hard  . Food insecurity - worry: Sometimes true  . Food insecurity - inability: Sometimes true  . Transportation needs - medical: Yes  . Transportation needs - non-medical: Yes  Occupational History  . Occupation: disabled    Employer: RETIRED  Tobacco Use  . Smoking status: Never Smoker  . Smokeless tobacco: Never Used  Substance and Sexual Activity  . Alcohol use: No  . Drug use: No  . Sexual activity: No  Other Topics Concern  . Not on file  Social History Narrative  . Not on file    Outpatient Encounter Medications as of 03/20/2017  Medication Sig  . Calcitriol (VECTICAL) 3 MCG/GM cream Apply topically at bedtime.    Marland Kitchen diltiazem 2 % GEL Apply 5 times a day rectally  . dimenhyDRINATE (DRAMAMINE) 50 MG tablet Take  25 mg by mouth daily.  Marland Kitchen ibuprofen (ADVIL,MOTRIN) 800 MG tablet Take 1 tablet (800 mg total) by mouth every 8 (eight) hours as needed.  . Lactulose 20 GM/30ML SOLN 30 cc by mouth twice per day as needed for constipation  . Polyethyl Glycol-Propyl Glycol (SYSTANE OP) Apply to eye 3 (three) times daily.  Marland Kitchen tiZANidine (ZANAFLEX) 4 MG tablet Take 1 tablet (4 mg total) by mouth every 6 (six) hours as needed for muscle spasms.  . [DISCONTINUED] ALPRAZolam (XANAX) 1 MG tablet Take 1 tablet (1 mg total) by mouth at bedtime as needed for anxiety.  . [DISCONTINUED] hydrocortisone (ANUSOL-HC) 25 MG suppository Place 1 suppository (25 mg total) rectally 2 (two) times daily. (Patient not taking: Reported on 03/20/2017)   No  facility-administered encounter medications on file as of 03/20/2017.     Activities of Daily Living In your present state of health, do you have any difficulty performing the following activities: 03/20/2017  Hearing? Y  Vision? N  Difficulty concentrating or making decisions? N  Walking or climbing stairs? Y  Dressing or bathing? N  Doing errands, shopping? N  Preparing Food and eating ? N  Using the Toilet? N  In the past six months, have you accidently leaked urine? N  Do you have problems with loss of bowel control? N  Comment history of severe constipation  Managing your Medications? N  Managing your Finances? N  Housekeeping or managing your Housekeeping? N  Some recent data might be hidden    Patient Care Team: Biagio Borg, MD as PCP - Nadara Eaton, MD as Consulting Physician (Orthopedic Surgery) [place], Jennefer Bravo, MD as Referring Physician (Dermatology) Shelor Melburn Hake, Rex Kras, LCSW as Social Worker (Licensed Clinical Social Worker) Monna Fam, MD as Consulting Physician (Ophthalmology)   Assessment:   This is a routine wellness examination for Ricky Kelly. Physical assessment deferred to PCP.   Exercise Activities and Dietary recommendations Current Exercise Habits: The patient does not participate in regular exercise at present, Exercise limited by: orthopedic condition(s)  Diet (meal preparation, eat out, water intake, caffeinated beverages, dairy products, fruits and vegetables): in general, a "healthy" diet     Reviewed heart healthy and diabetic diet, encouraged patient to increase daily water intake. Discussed high fiber diet, Diet education was provided via handout.  Goals    . Patient Stated     I want to take a vacation with my children. I will sit down and plan a vacation.       Fall Risk Fall Risk  03/20/2017 09/18/2016 03/19/2016 09/07/2015 03/08/2015  Falls in the past year? Yes No No No No  Number falls in past yr: 1 - - - -  Injury with  Fall? No - - - -  Risk for fall due to : Impaired balance/gait;Impaired mobility - - - -    Depression Screen PHQ 2/9 Scores 03/20/2017 03/20/2017 03/19/2016 09/07/2015  PHQ - 2 Score 4 1 1 1   PHQ- 9 Score 12 - - -    Cognitive Function       Ad8 score reviewed for issues:  Issues making decisions: no  Less interest in hobbies / activities: no  Repeats questions, stories (family complaining): no  Trouble using ordinary gadgets (microwave, computer, phone):no  Forgets the month or year: no  Mismanaging finances: no  Remembering appts: no  Daily problems with thinking and/or memory: no Ad8 score is= 0    Immunization History  Administered Date(s) Administered  . Influenza  Whole 12/03/2007, 10/19/2008, 10/03/2009, 09/25/2010  . Influenza,inj,Quad PF,6+ Mos 10/20/2012, 10/13/2013, 10/19/2014, 10/04/2015, 10/16/2016  . Pneumococcal Polysaccharide-23 02/15/2014  . Td 05/10/2008  . Zoster Recombinat (Shingrix) 07/21/2016   Screening Tests Health Maintenance  Topic Date Due  . COLONOSCOPY  09/05/2002  . TETANUS/TDAP  05/11/2018  . INFLUENZA VACCINE  Completed  . Hepatitis C Screening  Completed  . HIV Screening  Completed      Plan:     Nurse will [facility] to [facility] for community resources and assistance with affording medications.   Nurse will order Cologuard for patient  Continue doing brain stimulating activities (puzzles, reading, adult coloring books, staying active) to keep memory sharp.   Continue to eat heart healthy diet (full of fruits, vegetables, whole grains, lean protein, water--limit salt, fat, and sugar intake) and increase physical activity as tolerated.  I have personally reviewed and noted the following in the patient's chart:   . Medical and social history . Use of alcohol, tobacco or illicit drugs  . Current medications and supplements . Functional ability and status . Nutritional status . Physical activity . Advanced directives . List  of other physicians . Vitals . Screenings to include cognitive, depression, and falls . Referrals and appointments  In addition, I have reviewed and discussed with patient certain preventive protocols, quality metrics, and best practice recommendations. A written personalized care plan for preventive services as well as general preventive health recommendations were provided to patient.     Michiel Cowboy, RN  03/20/2017  Medical screening examination/treatment/procedure(s) were performed by non-physician practitioner and as supervising physician I was immediately available for consultation/collaboration. I agree with above. Cathlean Cower, MD

## 2017-03-20 NOTE — Progress Notes (Signed)
Subjective:    Patient ID: Ricky Kelly, male    DOB: 1950-05-20, 67 y.o.   MRN: [id number]  HPI  Here to f/u; overall doing ok,  Pt denies chest pain, increasing sob or doe, wheezing, orthopnea, PND, increased LE swelling, palpitations, dizziness or syncope.  Pt denies new neurological symptoms such as new headache, or facial or extremity weakness or numbness.  Pt denies polydipsia, polyuria, or low sugar episode.  Pt states overall good compliance with meds, mostly trying to follow appropriate diet, with wt overall stable,  but little exercise however. Currently trying to get past healing of anal fissure, states constipation improved for now. Pt continues to have recurring chronic LBP overall remains severe, but now worsening bowel or bladder change, fever, wt loss,  worsening LE pain/numbness/weakness, gait change or falls.  Has also had intolerance to lipitor and crestor - will not take further statin due to worseing constipation  Right neck mass without significant change, has fu with ENT planned for June he thinks and want to wait as he is not wanting biopsy.  Has had mild worsening depressive symptoms, but no suicidal ideation, or panic; has ongoing anxiety, not increased recently.  Is not amenable to trial of cymbalta in light of his ongoing pain, or other counseling referral Past Medical History:  Diagnosis Date  . Adjustment disorder 1/10   with depressive symptoms  . Allergic rhinitis   . Anxiety   . Arthritis   . Chronic cervical radiculopathy 02/02/2013  . Chronic low back pain 08/12/2010  . Depression   . Hearing loss    sensorineural  bilateral  . Hemorrhoids   . HOH (hard of hearing)   . Hyperlipidemia   . Hyperlipidemia   . Lumbago   . Psoriasis   . Retinal tear    bilateral no surgery  . Shingles    Past Surgical History:  Procedure Laterality Date  . CATARACT EXTRACTION Left   . detatched retina Left    laser surgery  . EYE SURGERY    . HEMORRHOID SURGERY        reports that  has never smoked. he has never used smokeless tobacco. He reports that he does not drink alcohol or use drugs. family history includes Cancer in his father and mother; Diabetes in his unknown relative. Allergies  Allergen Reactions  . Allegra [Fexofenadine Hcl] Other (See Comments)    constipation  . Escitalopram Oxalate     constipation  . Naproxen     constipation  . Statins Other (See Comments)    constipation   Current Outpatient Medications on File Prior to Visit  Medication Sig Dispense Refill  . Calcitriol (VECTICAL) 3 MCG/GM cream Apply topically at bedtime.      Marland Kitchen diltiazem 2 % GEL Apply 5 times a day rectally 1 Package 2  . dimenhyDRINATE (DRAMAMINE) 50 MG tablet Take 25 mg by mouth daily.    Marland Kitchen ibuprofen (ADVIL,MOTRIN) 800 MG tablet Take 1 tablet (800 mg total) by mouth every 8 (eight) hours as needed. 60 tablet 5  . Lactulose 20 GM/30ML SOLN 30 cc by mouth twice per day as needed for constipation 946 mL 5  . Polyethyl Glycol-Propyl Glycol (SYSTANE OP) Apply to eye 3 (three) times daily.    Marland Kitchen tiZANidine (ZANAFLEX) 4 MG tablet Take 1 tablet (4 mg total) by mouth every 6 (six) hours as needed for muscle spasms. 30 tablet 0   No current facility-administered medications on file prior to visit.  Review of Systems  Constitutional: Negative for other unusual diaphoresis or sweats HENT: Negative for ear discharge or swelling Eyes: Negative for other worsening visual disturbances Respiratory: Negative for stridor or other swelling  Gastrointestinal: Negative for worsening distension or other blood Genitourinary: Negative for retention or other urinary change Musculoskeletal: Negative for other MSK pain or swelling Skin: Negative for color change or other new lesions Neurological: Negative for worsening tremors and other numbness  Psychiatric/Behavioral: Negative for worsening agitation or other fatigue All other system neg per pt    Objective:    Physical Exam BP 128/84   Pulse 88   Temp 97.7 F (36.5 C) (Oral)   Ht 5\' 6"  (1.676 m)   Wt 165 lb (74.8 kg)   SpO2 98%   BMI 26.63 kg/m  VS noted,  Constitutional: Pt appears in NAD HENT: Head: NCAT.  Right Ear: External ear normal.  Left Ear: External ear normal.  Eyes: . Pupils are equal, round, and reactive to light. Conjunctivae and EOM are normal Nose: without d/c or deformity Neck: Neck supple. Gross normal ROM; right submandibular mass noted again at the angle of jaw Cardiovascular: Normal rate and regular rhythm.   Pulmonary/Chest: Effort normal and breath sounds without rales or wheezing.  Abd:  Soft, NT, ND, + BS, no organomegaly Neurological: Pt is alert. At baseline orientation, motor grossly intact Skin: Skin is warm. No rashes, other new lesions, no LE edema but has a shiny sheen it seems to both hands with palmar thickening and can no longer completely flex fingers of either hand Psychiatric: Pt behavior is normal without agitation , + depressed affect No other exam findings    Assessment & Plan:

## 2017-03-20 NOTE — Patient Instructions (Addendum)
Continue doing brain stimulating activities (puzzles, reading, adult coloring books, staying active) to keep memory sharp.   Continue to eat heart healthy diet (full of fruits, vegetables, whole grains, lean protein, water--limit salt, fat, and sugar intake) and increase physical activity as tolerated.   Ricky Kelly , Thank you for taking time to come for your Medicare Wellness Visit. I appreciate your ongoing commitment to your health goals. Please review the following plan we discussed and let me know if I can assist you in the future.   These are the goals we discussed: Goals    . Patient Stated     I want to take a vacation with my children.        This is a list of the screening recommended for you and due dates:  Health Maintenance  Topic Date Due  . Colon Cancer Screening  09/05/2002  . Tetanus Vaccine  05/11/2018  . Flu Shot  Completed  .  Hepatitis C: One time screening is recommended by Center for Disease Control  (CDC) for  adults born from [birth year] through 1963.   Completed  . HIV Screening  Completed

## 2017-03-21 ENCOUNTER — Encounter: Payer: Self-pay | Admitting: Internal Medicine

## 2017-03-21 NOTE — Assessment & Plan Note (Signed)
Improved, cont's with some discomfort regarding fissure and states he just cant apply the treatment 5 times per day as recommended per GI

## 2017-03-21 NOTE — Assessment & Plan Note (Signed)
Lab Results  Component Value Date   HGBA1C 5.4 09/18/2016  stable overall by history and exam, recent data reviewed with pt, and pt to continue medical treatment as before,  to f/u any worsening symptoms or concerns

## 2017-03-21 NOTE — Assessment & Plan Note (Signed)
Mild worsening but declines cymbalta or counseling referrrals

## 2017-03-21 NOTE — Assessment & Plan Note (Signed)
?   suspicous for systemic sclerosis but declines referral to rheum for now

## 2017-03-26 DIAGNOSIS — H25011 Cortical age-related cataract, right eye: Secondary | ICD-10-CM | POA: Diagnosis not present

## 2017-03-26 DIAGNOSIS — H2511 Age-related nuclear cataract, right eye: Secondary | ICD-10-CM | POA: Diagnosis not present

## 2017-03-26 DIAGNOSIS — H40013 Open angle with borderline findings, low risk, bilateral: Secondary | ICD-10-CM | POA: Diagnosis not present

## 2017-03-26 DIAGNOSIS — H35033 Hypertensive retinopathy, bilateral: Secondary | ICD-10-CM | POA: Diagnosis not present

## 2017-03-26 DIAGNOSIS — H35371 Puckering of macula, right eye: Secondary | ICD-10-CM | POA: Diagnosis not present

## 2017-04-02 ENCOUNTER — Ambulatory Visit (INDEPENDENT_AMBULATORY_CARE_PROVIDER_SITE_OTHER): Payer: Medicare Other | Admitting: Psychology

## 2017-04-02 DIAGNOSIS — F4321 Adjustment disorder with depressed mood: Secondary | ICD-10-CM | POA: Diagnosis not present

## 2017-04-06 ENCOUNTER — Encounter: Payer: Self-pay | Admitting: *Deleted

## 2017-04-06 ENCOUNTER — Other Ambulatory Visit: Payer: Self-pay | Admitting: *Deleted

## 2017-04-06 NOTE — Patient Outreach (Signed)
[place] [facility]) Care Management  04/06/2017  Ricky Kelly Janek 03-01-1950 [id number]  Referral via MD office: Reason: Community resources and unable to afford medications particularly psoriasis medications:  Patient is listed as primary contact.  Use home phone due to hearing issues.  Telephone call to patient; fast busy ring-unable to leave message. Telephone call to alternate number-fast busy ring; unable to leave message.  Plan: Geophysicist/field seismologist. Will follow up 3-5 days.  Sherrin Daisy, RN BSN [place] Management Coordinator [facility] Care Management  [phone]

## 2017-04-09 ENCOUNTER — Other Ambulatory Visit: Payer: Self-pay | Admitting: *Deleted

## 2017-04-09 NOTE — Patient Outreach (Signed)
[place] [facility]) Care Management  04/09/2017  Ricky Kelly August 1950-04-11 [id number]  Referral via MD office: Reason: Community resources and unable to afford medications particularly psoriasis medications:  Patient is listed as primary contact.  Use home phone due to hearing issues  Telephone call to home phone x 2 attempt; left HIPPA compliant voice mail requesting call back.  Plan: Follow up 7 business days. Outreach has been sent.  Sherrin Daisy, RN BSN [place] Management Coordinator [facility] Care Management  [phone]

## 2017-04-21 ENCOUNTER — Ambulatory Visit: Payer: Self-pay | Admitting: *Deleted

## 2017-04-23 ENCOUNTER — Other Ambulatory Visit: Payer: Self-pay | Admitting: *Deleted

## 2017-04-30 ENCOUNTER — Telehealth: Payer: Self-pay | Admitting: *Deleted

## 2017-05-01 ENCOUNTER — Other Ambulatory Visit: Payer: Self-pay | Admitting: *Deleted

## 2017-05-01 NOTE — Patient Outreach (Signed)
[place] [facility]) Care Management  05/01/2017  Ricky Kelly 1950-10-30 [id number]  Call received from Emelia Loron, RN and AWV Nurse at [place] re: [name] request for assistance. We have been unable to reach [name] by phone on several outreach attempts but was able to reach him today.   [name] is requesting assistance with medication costs. In particular, he finds that the out of pocket cost for his Calcitrol topical is prohibitive. He reports that he has tried to apply for Medicaid in the past and was told he made too much money to qualify. He hopes that manufacturer patient assistance is available for the Calcitrol.   Plan: I referred [name] to our pharmacy team and sent a letter to his home so that he will have information about [place] Management.    [place] Management  [phone]

## 2017-05-01 NOTE — Telephone Encounter (Signed)
Called patient in response to a VM he left nurse. Patient needed assistance in reaching [facility]. Nurse was able to give the patient a number to contact [place] and nurse stated she would also inform Al[place] that the patient would like to be contacted. Nursed asked patient to call her back if he had any further questions or concerns.

## 2017-05-05 ENCOUNTER — Other Ambulatory Visit: Payer: Self-pay | Admitting: Pharmacist

## 2017-05-05 NOTE — Patient Outreach (Addendum)
[place] [facility]) Care Management  05/05/2017  Ricky Kelly [birth year] [id number]   67 year old male referred to [place] Management by [facility] for medication assistance with topical calcitrol (Vectical Ointment).   PMHx includes, but not limited to, depression, anxiety, psoriasis, HLD, bilateral hearing loss, chronic low back pain, and athralgia.    Per review of [email or web address].biz/, patient does not have any current Medicare Part C or Part D coverage, only traditional Medicare A/B.    Patient may quality for Patient Assistance Program for [employer] via [employer].   Unsuccessful call placed to Ricky Kelly today.  I left a HIPAA compliant voicemail on both home and mobile phone numbers listed in [facility].   Plan: I will follow-up with Ricky Kelly later this week regarding medication assistance.   Ralene Bathe, PharmD, [place] [phone]    Incoming call and voicemail received from Ricky Kelly.  Return call to patient. HIPAA identifiers verified. Patient reports cost of Vectical is $1400 for a 6 weeks supply which he cannot afford.  He has paid this in the past but only uses medication sparingly instead of as prescribed to make it last longer.  He also has received samples from dermatology office but of medications in a similar class which do not work as well.  Dr. Renda Rolls prescribes Vectical for Ricky Kelly.   I reviewed [place] PAP with patient.  He confirms he meets income requirements and does not have Medicare Part D.  He is agreeable to meet at [facility] office to complete application as he does not wish to mail material with his SSN.    Plan: I will meet with patient at [facility] on Thursday, April 18th, at 1:00PM to complete PAP forms for Vectical.  I will alert [place] technician to assist with obtaining appropriate forms and prescription from Dr. Renda Rolls.   Ralene Bathe, PharmD,  [place] [phone]

## 2017-05-06 ENCOUNTER — Other Ambulatory Visit: Payer: Self-pay | Admitting: Pharmacist

## 2017-05-06 NOTE — Patient Outreach (Signed)
[facility]) Care Management  05/06/2017  Ricky Kelly [birth year] [id number]  Successful call place to Mr. Colleran.  Website for [place], has product listed for PAP however actual paper application does not have product as choice.  I explained to Mr. Eick that unfortunately we may not be able to apply for product.  I am awaiting call back from [place] representative to verify.    Plan: [facility] appointment cancelled tomorrow.  I will wait to hear from [facility] and will update patient accordingly later this week.   Ralene Bathe, PharmD, [place] [phone]

## 2017-05-07 ENCOUNTER — Ambulatory Visit: Payer: Self-pay | Admitting: Pharmacist

## 2017-05-07 ENCOUNTER — Other Ambulatory Visit: Payer: Self-pay | Admitting: Pharmacist

## 2017-05-07 NOTE — Patient Outreach (Signed)
[facility]) Care Management  05/07/2017  Ricky Kelly 05/17/1950 [id number]  [facility] no longer includes Vectical in their patient assistance program.  Call placed to Ricky Kelly to relay news.  Ricky Kelly voiced understanding.   We reviewed other possible options for savings:   Medicare Part D: Patient reports did not sign-up for Medicare Part D when he was initially eligible therefore has a lifetime penalty that would incur once he does sign up.  I provided him with [facility] phone number to call for more information on exactly how much this penalty is and what Medicare Part D plans may cover this medication if he wished to enroll.  Patient reports he will reach out to [facility] to follow-up on this.    Coupons: [employer]: With coupon, 100g tube of vectical = $272.01.  Patient reports he has access to a computer and will look into this.    Substitution to another medication that does have patient assistance.  Patient reports he has spoken with his provider about this already and prefers to use Vectical.   Plan: I will close patient's case as I am unable to assist further with medication assistance with Vectical as patient does not qualify for LIS and no programs available for patient assistance.  I am happy to help in the future if other medication needs arise.   Ralene Bathe, PharmD, [place] [phone]

## 2017-05-11 DIAGNOSIS — D490 Neoplasm of unspecified behavior of digestive system: Secondary | ICD-10-CM | POA: Diagnosis not present

## 2017-05-13 ENCOUNTER — Encounter: Payer: Self-pay | Admitting: Internal Medicine

## 2017-05-13 ENCOUNTER — Telehealth: Payer: Self-pay | Admitting: Internal Medicine

## 2017-05-13 NOTE — Telephone Encounter (Signed)
Pt states that due to his handicap he was unable to use the gel for his fissure. Pt wanted to know how long it takes for a fissure to heal. Discussed with him that it can take a long time to heal. Pt also wanted to know if it was ok for him to continue taking the lactulose that Dr. Jenny Reichmann prescribed for him, let know if it was working he should continue taking the lactulose.

## 2017-05-13 NOTE — Telephone Encounter (Signed)
Error

## 2017-05-17 DIAGNOSIS — J101 Influenza due to other identified influenza virus with other respiratory manifestations: Secondary | ICD-10-CM | POA: Diagnosis not present

## 2017-05-21 ENCOUNTER — Ambulatory Visit: Payer: Medicare Other | Admitting: Psychology

## 2017-06-11 ENCOUNTER — Encounter: Payer: Self-pay | Admitting: Internal Medicine

## 2017-06-11 ENCOUNTER — Ambulatory Visit (INDEPENDENT_AMBULATORY_CARE_PROVIDER_SITE_OTHER): Payer: Medicare Other | Admitting: Internal Medicine

## 2017-06-11 VITALS — BP 112/78 | HR 78 | Temp 97.8°F | Ht 66.0 in | Wt 164.0 lb

## 2017-06-11 DIAGNOSIS — R739 Hyperglycemia, unspecified: Secondary | ICD-10-CM

## 2017-06-11 DIAGNOSIS — K602 Anal fissure, unspecified: Secondary | ICD-10-CM | POA: Diagnosis not present

## 2017-06-11 DIAGNOSIS — F329 Major depressive disorder, single episode, unspecified: Secondary | ICD-10-CM

## 2017-06-11 DIAGNOSIS — F32A Depression, unspecified: Secondary | ICD-10-CM

## 2017-06-11 MED ORDER — LACTULOSE 20 GM/30ML PO SOLN: ORAL | 5 refills | Status: DC

## 2017-06-11 NOTE — Assessment & Plan Note (Signed)
stable overall by history and exam, recent data reviewed with pt, and pt to continue medical treatment as before,  to f/u any worsening symptoms or concerns Lab Results  Component Value Date   HGBA1C 5.4 09/18/2016   

## 2017-06-11 NOTE — Patient Instructions (Signed)
Please continue all other medications as before, and refills have been done if requested - the lactulose  Please have the pharmacy call with any other refills you may need.  Please keep your appointments with your specialists as you may have planned  Please call if you change your mind about the referral to General Surgury

## 2017-06-11 NOTE — Assessment & Plan Note (Signed)
Denies signficant symptoms, o.w stable overall by history and exam, and pt to continue medical treatment as before,  to f/u any worsening symptoms or concerns

## 2017-06-11 NOTE — Assessment & Plan Note (Signed)
Declines further diltiazem topical, declines general surgury eval for now but will let us know if changes his mind

## 2017-06-11 NOTE — Progress Notes (Signed)
Subjective:    Patient ID: Ricky Kelly, male    DOB: [birth year], 67 y.o.   MRN: [id number]  HPI  Here with c/o mild to mod persistent perirectal area pain with hx of internal hemorrhoids, tx with linzess but had large diarrhea so stopped. Seen here, then seen just later with hemorrhoids and fissure per GI, lactulose working well now for constipation, no fever, blood,  N/v, wt loss or and no abd pain.  Pt wants exam but not digital aspect of the rectal exam.  Never did try the diltiazem 2% topical as he could not apply to 5 times per day.  He is not sure he wants a referral to general surgury either  Denies worsening reflux, abd pain, dysphagia, n/v, bowel change or blood. Denies urinary symptoms such as dysuria, frequency, urgency, flank pain, hematuria or n/v, fever, chills. Pt denies chest pain, increased sob or doe, wheezing, orthopnea, PND, increased LE swelling, palpitations, dizziness or syncope. Past Medical History:  Diagnosis Date  . Adjustment disorder 1/10   with depressive symptoms  . Allergic rhinitis   . Anxiety   . Arthritis   . Chronic cervical radiculopathy 02/02/2013  . Chronic low back pain 08/12/2010  . Depression   . Hearing loss    sensorineural  bilateral  . Hemorrhoids   . HOH (hard of hearing)   . Hyperlipidemia   . Hyperlipidemia   . Lumbago   . Psoriasis   . Retinal tear    bilateral no surgery  . Shingles    Past Surgical History:  Procedure Laterality Date  . CATARACT EXTRACTION Left   . detatched retina Left    laser surgery  . EYE SURGERY    . HEMORRHOID SURGERY      reports that he has never smoked. He has never used smokeless tobacco. He reports that he does not drink alcohol or use drugs. family history includes Cancer in his father and mother; Diabetes in his unknown relative. Allergies  Allergen Reactions  . Allegra [Fexofenadine Hcl] Other (See Comments)    constipation  . Escitalopram Oxalate     constipation  . Naproxen    constipation  . Statins Other (See Comments)    constipation   Current Outpatient Medications on File Prior to Visit  Medication Sig Dispense Refill  . ALPRAZolam (XANAX) 1 MG tablet Take 1 tablet (1 mg total) by mouth at bedtime as needed for anxiety. 30 tablet 5  . Calcitriol (VECTICAL) 3 MCG/GM cream Apply topically at bedtime.      Marland Kitchen diltiazem 2 % GEL Apply 5 times a day rectally 1 Package 2  . dimenhyDRINATE (DRAMAMINE) 50 MG tablet Take 25 mg by mouth daily.    Marland Kitchen ibuprofen (ADVIL,MOTRIN) 800 MG tablet Take 1 tablet (800 mg total) by mouth every 8 (eight) hours as needed. 60 tablet 5  . Polyethyl Glycol-Propyl Glycol (SYSTANE OP) Apply to eye 3 (three) times daily.    Marland Kitchen tiZANidine (ZANAFLEX) 4 MG tablet Take 1 tablet (4 mg total) by mouth every 6 (six) hours as needed for muscle spasms. 30 tablet 0   No current facility-administered medications on file prior to visit.    Review of Systems  Constitutional: Negative for other unusual diaphoresis or sweats HENT: Negative for ear discharge or swelling Eyes: Negative for other worsening visual disturbances Respiratory: Negative for stridor or other swelling  Gastrointestinal: Negative for worsening distension or other blood Genitourinary: Negative for retention or other urinary change Musculoskeletal: Negative  for other MSK pain or swelling Skin: Negative for color change or other new lesions Neurological: Negative for worsening tremors and other numbness  Psychiatric/Behavioral: Negative for worsening agitation or other fatigue All other system neg per pt    Objective:   Physical Exam BP 112/78   Pulse 78   Temp 97.8 F (36.6 C) (Oral)   Ht 5\' 6"  (1.676 m)   Wt 164 lb (74.4 kg)   SpO2 99%   BMI 26.47 kg/m  VS noted,  Constitutional: Pt appears in NAD HENT: Head: NCAT.  Right Ear: External ear normal.  Left Ear: External ear normal.  Eyes: . Pupils are equal, round, and reactive to light. Conjunctivae and EOM are  normal Nose: without d/c or deformity Neck: Neck supple. Gross normal ROM Cardiovascular: Normal rate and regular rhythm.   Pulmonary/Chest: Effort normal and breath sounds without rales or wheezing.  Abd:  Soft, NT, ND, + BS, no organomegaly Neurological: Pt is alert. At baseline orientation, motor grossly intact Skin: Skin is warm. No rashes, other new lesions, no LE edema Psychiatric: Pt behavior is normal without agitation  Rectal exam - visual only with possible 6 oclock fissuring noted, no overt bleeding but tender and pt refused DRE No other exam findings    Assessment & Plan:

## 2017-07-31 ENCOUNTER — Ambulatory Visit (INDEPENDENT_AMBULATORY_CARE_PROVIDER_SITE_OTHER): Payer: Medicare Other | Admitting: Psychology

## 2017-07-31 DIAGNOSIS — F4321 Adjustment disorder with depressed mood: Secondary | ICD-10-CM | POA: Diagnosis not present

## 2017-08-25 ENCOUNTER — Encounter: Payer: Self-pay | Admitting: Internal Medicine

## 2017-08-25 ENCOUNTER — Ambulatory Visit (INDEPENDENT_AMBULATORY_CARE_PROVIDER_SITE_OTHER): Payer: Medicare Other | Admitting: Internal Medicine

## 2017-08-25 VITALS — BP 124/70 | HR 90 | Temp 97.9°F | Ht 66.0 in | Wt 164.0 lb

## 2017-08-25 DIAGNOSIS — R739 Hyperglycemia, unspecified: Secondary | ICD-10-CM

## 2017-08-25 DIAGNOSIS — K602 Anal fissure, unspecified: Secondary | ICD-10-CM

## 2017-08-25 DIAGNOSIS — K5900 Constipation, unspecified: Secondary | ICD-10-CM

## 2017-08-25 MED ORDER — HYDROCORTISONE ACETATE 25 MG RE SUPP: 25.0000 mg | Freq: Two times a day (BID) | RECTAL | 1 refills | Status: AC

## 2017-08-25 NOTE — Assessment & Plan Note (Signed)
Markedly improved with lactulose and stool softner,  to f/u any worsening symptoms or concerns

## 2017-08-25 NOTE — Assessment & Plan Note (Signed)
stable overall by history and exam, recent data reviewed with pt, and pt to continue medical treatment as before,  to f/u any worsening symptoms or concerns Lab Results  Component Value Date   HGBA1C 5.4 09/18/2016

## 2017-08-25 NOTE — Progress Notes (Signed)
Subjective:    Patient ID: Ricky Kelly, male    DOB: [birth year], 67 y.o.   MRN: [id number]  HPI  Here to f/u with persistent rectal pain essentially not improved unresolved x 9 mo despite several trial of tx including cream, supp's, and could not tolerate the diltiazem application 5 times per day.  Has also known internal hemorrhoid but no recent bleeding.  Denies worsening reflux, abd pain, dysphagia, n/v, bowel change or blood.   Pt denies fever, wt loss, night sweats, loss of appetite, or other constitutional symptoms  Pt denies chest pain, increased sob or doe, wheezing, orthopnea, PND, increased LE swelling, palpitations, dizziness or syncope.  Chronic pain overall no change.  Denies worsening depressive symptoms, suicidal ideation, or panic Past Medical History:  Diagnosis Date  . Adjustment disorder 1/10   with depressive symptoms  . Allergic rhinitis   . Anxiety   . Arthritis   . Chronic cervical radiculopathy 02/02/2013  . Chronic low back pain 08/12/2010  . Depression   . Hearing loss    sensorineural  bilateral  . Hemorrhoids   . HOH (hard of hearing)   . Hyperlipidemia   . Hyperlipidemia   . Lumbago   . Psoriasis   . Retinal tear    bilateral no surgery  . Shingles    Past Surgical History:  Procedure Laterality Date  . CATARACT EXTRACTION Left   . detatched retina Left    laser surgery  . EYE SURGERY    . HEMORRHOID SURGERY      reports that he has never smoked. He has never used smokeless tobacco. He reports that he does not drink alcohol or use drugs. family history includes Cancer in his father and mother; Diabetes in his unknown relative. Allergies  Allergen Reactions  . Allegra [Fexofenadine Hcl] Other (See Comments)    constipation  . Escitalopram Oxalate     constipation  . Naproxen     constipation  . Statins Other (See Comments)    constipation   Current Outpatient Medications on File Prior to Visit  Medication Sig Dispense Refill  .  ALPRAZolam (XANAX) 1 MG tablet Take 1 tablet (1 mg total) by mouth at bedtime as needed for anxiety. 30 tablet 5  . Calcitriol (VECTICAL) 3 MCG/GM cream Apply topically at bedtime.      . dimenhyDRINATE (DRAMAMINE) 50 MG tablet Take 25 mg by mouth daily.    Marland Kitchen ibuprofen (ADVIL,MOTRIN) 800 MG tablet Take 1 tablet (800 mg total) by mouth every 8 (eight) hours as needed. 60 tablet 5  . Lactulose 20 GM/30ML SOLN 30 cc by mouth twice per day as needed for constipation 946 mL 5  . Polyethyl Glycol-Propyl Glycol (SYSTANE OP) Apply to eye 3 (three) times daily.    Marland Kitchen tiZANidine (ZANAFLEX) 4 MG tablet Take 1 tablet (4 mg total) by mouth every 6 (six) hours as needed for muscle spasms. 30 tablet 0   No current facility-administered medications on file prior to visit.    Review of Systems  Constitutional: Negative for other unusual diaphoresis or sweats HENT: Negative for ear discharge or swelling Eyes: Negative for other worsening visual disturbances Respiratory: Negative for stridor or other swelling  Gastrointestinal: Negative for worsening distension or other blood Genitourinary: Negative for retention or other urinary change Musculoskeletal: Negative for other MSK pain or swelling Skin: Negative for color change or other new lesions Neurological: Negative for worsening tremors and other numbness  Psychiatric/Behavioral: Negative for worsening agitation or  other fatigue All other system neg per pt    Objective:   Physical Exam BP 124/70   Pulse 90   Temp 97.9 F (36.6 C) (Oral)   Ht 5\' 6"  (1.676 m)   Wt 164 lb (74.4 kg)   SpO2 96%   BMI 26.47 kg/m  VS noted,  Constitutional: Pt appears in NAD HENT: Head: NCAT.  Right Ear: External ear normal.  Left Ear: External ear normal.  Eyes: . Pupils are equal, round, and reactive to light. Conjunctivae and EOM are normal Nose: without d/c or deformity Neck: Neck supple. Gross normal ROM Cardiovascular: Normal rate and regular rhythm.     Pulmonary/Chest: Effort normal and breath sounds without rales or wheezing.  Abd:  Soft, NT, ND, + BS, no organomegaly Rectal exam: possible small fissure present between 0400 and 0600, DRE not performed per pt preference Neurological: Pt is alert. At baseline orientation, motor grossly intact Skin: Skin is warm. No rashes, other new lesions, no LE edema Psychiatric: Pt behavior is normal without agitation , chronic mild depressed affect No other exam findings    Assessment & Plan:

## 2017-08-25 NOTE — Assessment & Plan Note (Signed)
Suspect chronic persistent, for trial anusol HC supp, declines general surgury referral

## 2017-08-25 NOTE — Patient Instructions (Signed)
Please take all new medication as prescribed - the suppository  Please continue all other medications as before, and refills have been done if requested.  Please have the pharmacy call with any other refills you may need.  Please keep your appointments with your specialists as you may have planned

## 2017-09-17 ENCOUNTER — Ambulatory Visit: Payer: Medicare Other | Admitting: Psychology

## 2017-09-17 DIAGNOSIS — H40013 Open angle with borderline findings, low risk, bilateral: Secondary | ICD-10-CM | POA: Diagnosis not present

## 2017-09-17 DIAGNOSIS — H1851 Endothelial corneal dystrophy: Secondary | ICD-10-CM | POA: Diagnosis not present

## 2017-09-17 DIAGNOSIS — H01003 Unspecified blepharitis right eye, unspecified eyelid: Secondary | ICD-10-CM | POA: Diagnosis not present

## 2017-09-17 DIAGNOSIS — H04123 Dry eye syndrome of bilateral lacrimal glands: Secondary | ICD-10-CM | POA: Diagnosis not present

## 2017-09-23 ENCOUNTER — Ambulatory Visit (INDEPENDENT_AMBULATORY_CARE_PROVIDER_SITE_OTHER): Payer: Medicare Other | Admitting: Internal Medicine

## 2017-09-23 ENCOUNTER — Encounter: Payer: Self-pay | Admitting: Internal Medicine

## 2017-09-23 ENCOUNTER — Other Ambulatory Visit (INDEPENDENT_AMBULATORY_CARE_PROVIDER_SITE_OTHER): Payer: Medicare Other

## 2017-09-23 VITALS — BP 116/72 | HR 74 | Temp 98.5°F | Ht 66.0 in | Wt 163.0 lb

## 2017-09-23 DIAGNOSIS — N320 Bladder-neck obstruction: Secondary | ICD-10-CM

## 2017-09-23 DIAGNOSIS — G8929 Other chronic pain: Secondary | ICD-10-CM

## 2017-09-23 DIAGNOSIS — F411 Generalized anxiety disorder: Secondary | ICD-10-CM

## 2017-09-23 DIAGNOSIS — M545 Low back pain: Secondary | ICD-10-CM | POA: Diagnosis not present

## 2017-09-23 DIAGNOSIS — R739 Hyperglycemia, unspecified: Secondary | ICD-10-CM | POA: Diagnosis not present

## 2017-09-23 DIAGNOSIS — E785 Hyperlipidemia, unspecified: Secondary | ICD-10-CM

## 2017-09-23 LAB — CBC WITH DIFFERENTIAL/PLATELET
BASOS PCT: 0.4 % (ref 0.0–3.0)
Basophils Absolute: 0 10*3/uL (ref 0.0–0.1)
EOS PCT: 1.4 % (ref 0.0–5.0)
Eosinophils Absolute: 0.1 10*3/uL (ref 0.0–0.7)
HCT: 42.9 % (ref 39.0–52.0)
HEMOGLOBIN: 14.7 g/dL (ref 13.0–17.0)
LYMPHS ABS: 3.2 10*3/uL (ref 0.7–4.0)
Lymphocytes Relative: 44.3 % (ref 12.0–46.0)
MCHC: 34.2 g/dL (ref 30.0–36.0)
MCV: 88.6 fl (ref 78.0–100.0)
Monocytes Absolute: 0.7 10*3/uL (ref 0.1–1.0)
Monocytes Relative: 9.3 % (ref 3.0–12.0)
NEUTROS PCT: 44.6 % (ref 43.0–77.0)
Neutro Abs: 3.2 10*3/uL (ref 1.4–7.7)
Platelets: 169 10*3/uL (ref 150.0–400.0)
RBC: 4.84 Mil/uL (ref 4.22–5.81)
RDW: 13.2 % (ref 11.5–15.5)
WBC: 7.1 10*3/uL (ref 4.0–10.5)

## 2017-09-23 LAB — URINALYSIS, ROUTINE W REFLEX MICROSCOPIC
Bilirubin Urine: NEGATIVE
Hgb urine dipstick: NEGATIVE
KETONES UR: NEGATIVE
Leukocytes, UA: NEGATIVE
NITRITE: NEGATIVE
RBC / HPF: NONE SEEN (ref 0–?)
Total Protein, Urine: NEGATIVE
Urine Glucose: NEGATIVE
Urobilinogen, UA: 0.2 (ref 0.0–1.0)
WBC UA: NONE SEEN (ref 0–?)
pH: 6 (ref 5.0–8.0)

## 2017-09-23 LAB — LIPID PANEL
CHOLESTEROL: 190 mg/dL (ref 0–200)
HDL: 42.4 mg/dL (ref >39.00)
LDL CALC: 121 mg/dL — AB (ref 0–99)
NONHDL: 147.49
Total CHOL/HDL Ratio: 4
Triglycerides: 134 mg/dL (ref 0.0–149.0)
VLDL: 26.8 mg/dL (ref 0.0–40.0)

## 2017-09-23 LAB — HEPATIC FUNCTION PANEL
ALT: 18 U/L (ref 0–53)
AST: 20 U/L (ref 0–37)
Albumin: 4.5 g/dL (ref 3.5–5.2)
Alkaline Phosphatase: 39 U/L (ref 39–117)
BILIRUBIN DIRECT: 0.1 mg/dL (ref 0.0–0.3)
BILIRUBIN TOTAL: 0.5 mg/dL (ref 0.2–1.2)
Total Protein: 7.5 g/dL (ref 6.0–8.3)

## 2017-09-23 LAB — HEMOGLOBIN A1C: HEMOGLOBIN A1C: 5.5 % (ref 4.6–6.5)

## 2017-09-23 LAB — BASIC METABOLIC PANEL
BUN: 9 mg/dL (ref 6–23)
CHLORIDE: 99 meq/L (ref 96–112)
CO2: 30 mEq/L (ref 19–32)
Calcium: 9.5 mg/dL (ref 8.4–10.5)
Creatinine, Ser: 1 mg/dL (ref 0.40–1.50)
GFR: 79.69 mL/min (ref >60.00)
GLUCOSE: 101 mg/dL — AB (ref 70–99)
POTASSIUM: 4.2 meq/L (ref 3.5–5.1)
Sodium: 135 mEq/L (ref 135–145)

## 2017-09-23 LAB — TSH: TSH: 1.75 u[IU]/mL (ref 0.35–4.50)

## 2017-09-23 LAB — PSA: PSA: 1.09 ng/mL (ref 0.10–4.00)

## 2017-09-23 MED ORDER — ALPRAZOLAM 1 MG PO TABS: 1.0000 mg | ORAL_TABLET | Freq: Every evening | ORAL | 5 refills | Status: DC | PRN

## 2017-09-23 NOTE — Assessment & Plan Note (Signed)
stable overall by history and exam, recent data reviewed with pt, and pt to continue medical treatment as before,  to f/u any worsening symptoms or concerns, for f/u lab today 

## 2017-09-23 NOTE — Assessment & Plan Note (Signed)
stable overall by history and exam, recent data reviewed with pt, and pt to continue medical treatment as before,  to f/u any worsening symptoms or concerns Lab Results  Component Value Date   HGBA1C 5.5 09/23/2017

## 2017-09-23 NOTE — Assessment & Plan Note (Signed)
stable overall by history and exam, and pt to continue medical treatment as before,  to f/u any worsening symptoms or concerns 

## 2017-09-23 NOTE — Assessment & Plan Note (Signed)
stable overall by history and exam, recent data reviewed with pt, and pt to continue medical treatment as before,  to f/u any worsening symptoms or concerns  

## 2017-09-23 NOTE — Progress Notes (Signed)
Subjective:    Patient ID: Ricky Kelly, male    DOB: 11/03/1950, 68 y.o.   MRN: [id number]  HPI  Here for yearly f/u;  Overall doing ok;  Pt denies Chest pain, worsening SOB, DOE, wheezing, orthopnea, PND, worsening LE edema, palpitations, dizziness or syncope.  Pt denies neurological change such as new headache, facial or extremity weakness.  Pt denies polydipsia, polyuria, or low sugar symptoms. Pt states overall good compliance with treatment and medications, good tolerability, and has been trying to follow appropriate diet.  Pt denies worsening depressive symptoms, suicidal ideation or panic. No fever, night sweats, wt loss, loss of appetite, or other constitutional symptoms.  Pt states good ability with ADL's, has low fall risk, home safety reviewed and adequate, no other significant changes in hearing or vision, and only occasionally active with exercise.  Rectal pain no worse, maybe getting better.  Pt continues to have recurring LBP without change in severity, bowel or bladder change, fever, wt loss,  worsening LE pain/numbness/weakness, gait change or falls.   Past Medical History:  Diagnosis Date  . Adjustment disorder 1/10   with depressive symptoms  . Allergic rhinitis   . Anxiety   . Arthritis   . Chronic cervical radiculopathy 02/02/2013  . Chronic low back pain 08/12/2010  . Depression   . Hearing loss    sensorineural  bilateral  . Hemorrhoids   . HOH (hard of hearing)   . Hyperlipidemia   . Hyperlipidemia   . Lumbago   . Psoriasis   . Retinal tear    bilateral no surgery  . Shingles    Past Surgical History:  Procedure Laterality Date  . CATARACT EXTRACTION Left   . detatched retina Left    laser surgery  . EYE SURGERY    . HEMORRHOID SURGERY      reports that he has never smoked. He has never used smokeless tobacco. He reports that he does not drink alcohol or use drugs. family history includes Cancer in his father and mother; Diabetes in his unknown  relative. Allergies  Allergen Reactions  . Allegra [Fexofenadine Hcl] Other (See Comments)    constipation  . Escitalopram Oxalate     constipation  . Naproxen     constipation  . Statins Other (See Comments)    constipation   Current Outpatient Medications on File Prior to Visit  Medication Sig Dispense Refill  . Calcitriol (VECTICAL) 3 MCG/GM cream Apply topically at bedtime.      . dimenhyDRINATE (DRAMAMINE) 50 MG tablet Take 25 mg by mouth daily.    . hydrocortisone (ANUSOL-HC) 25 MG suppository Place 1 suppository (25 mg total) rectally every 12 (twelve) hours. 12 suppository 1  . ibuprofen (ADVIL,MOTRIN) 800 MG tablet Take 1 tablet (800 mg total) by mouth every 8 (eight) hours as needed. 60 tablet 5  . Lactulose 20 GM/30ML SOLN 30 cc by mouth twice per day as needed for constipation 946 mL 5  . Polyethyl Glycol-Propyl Glycol (SYSTANE OP) Apply to eye 3 (three) times daily.    Marland Kitchen tiZANidine (ZANAFLEX) 4 MG tablet Take 1 tablet (4 mg total) by mouth every 6 (six) hours as needed for muscle spasms. 30 tablet 0   No current facility-administered medications on file prior to visit.    Review of Systems  Constitutional: Negative for other unusual diaphoresis or sweats HENT: Negative for ear discharge or swelling Eyes: Negative for other worsening visual disturbances Respiratory: Negative for stridor or other swelling  Gastrointestinal: Negative for worsening distension or other blood Genitourinary: Negative for retention or other urinary change Musculoskeletal: Negative for other MSK pain or swelling Skin: Negative for color change or other new lesions Neurological: Negative for worsening tremors and other numbness  Psychiatric/Behavioral: Negative for worsening agitation or other fatigue All other system neg per pt    Objective:   Physical Exam BP 116/72   Pulse 74   Temp 98.5 F (36.9 C) (Oral)   Ht 5\' 6"  (1.676 m)   Wt 163 lb (73.9 kg)   SpO2 97%   BMI 26.31 kg/m  VS  noted, not ill appearing Constitutional: Pt appears in NAD HENT: Head: NCAT.  Right Ear: External ear normal.  Left Ear: External ear normal.  Eyes: . Pupils are equal, round, and reactive to light. Conjunctivae and EOM are normal Nose: without d/c or deformity Neck: Neck supple. Gross normal ROM Cardiovascular: Normal rate and regular rhythm.   Pulmonary/Chest: Effort normal and breath sounds without rales or wheezing.  Neurological: Pt is alert. At baseline orientation, motor grossly intact Skin: Skin is warm. No rashes, other new lesions, no LE edema Psychiatric: Pt behavior is normal without agitation  No other exam findings Lab Results  Component Value Date   WBC 7.1 09/23/2017   HGB 14.7 09/23/2017   HCT 42.9 09/23/2017   PLT 169.0 09/23/2017   GLUCOSE 101 (H) 09/23/2017   CHOL 190 09/23/2017   TRIG 134.0 09/23/2017   HDL 42.40 09/23/2017   LDLDIRECT 100.0 09/18/2016   LDLCALC 121 (H) 09/23/2017   ALT 18 09/23/2017   AST 20 09/23/2017   NA 135 09/23/2017   K 4.2 09/23/2017   CL 99 09/23/2017   CREATININE 1.00 09/23/2017   BUN 9 09/23/2017   CO2 30 09/23/2017   TSH 1.75 09/23/2017   PSA 1.09 09/23/2017   HGBA1C 5.5 09/23/2017       Assessment & Plan:

## 2017-09-23 NOTE — Patient Instructions (Signed)

## 2017-10-01 ENCOUNTER — Ambulatory Visit (INDEPENDENT_AMBULATORY_CARE_PROVIDER_SITE_OTHER): Payer: Medicare Other | Admitting: Psychology

## 2017-10-01 DIAGNOSIS — F4321 Adjustment disorder with depressed mood: Secondary | ICD-10-CM | POA: Diagnosis not present

## 2017-10-07 ENCOUNTER — Ambulatory Visit (INDEPENDENT_AMBULATORY_CARE_PROVIDER_SITE_OTHER): Payer: Medicare Other

## 2017-10-07 DIAGNOSIS — Z23 Encounter for immunization: Secondary | ICD-10-CM

## 2017-10-17 ENCOUNTER — Other Ambulatory Visit: Payer: Self-pay | Admitting: Internal Medicine

## 2017-11-26 ENCOUNTER — Ambulatory Visit (INDEPENDENT_AMBULATORY_CARE_PROVIDER_SITE_OTHER): Payer: Medicare Other | Admitting: Psychology

## 2017-11-26 DIAGNOSIS — F4321 Adjustment disorder with depressed mood: Secondary | ICD-10-CM | POA: Diagnosis not present

## 2017-11-27 DIAGNOSIS — H31092 Other chorioretinal scars, left eye: Secondary | ICD-10-CM | POA: Diagnosis not present

## 2017-11-27 DIAGNOSIS — H43811 Vitreous degeneration, right eye: Secondary | ICD-10-CM | POA: Diagnosis not present

## 2017-11-27 DIAGNOSIS — H35371 Puckering of macula, right eye: Secondary | ICD-10-CM | POA: Diagnosis not present

## 2017-11-27 DIAGNOSIS — H33191 Other retinoschisis and retinal cysts, right eye: Secondary | ICD-10-CM | POA: Diagnosis not present

## 2017-12-02 DIAGNOSIS — L710 Perioral dermatitis: Secondary | ICD-10-CM | POA: Diagnosis not present

## 2017-12-02 DIAGNOSIS — L409 Psoriasis, unspecified: Secondary | ICD-10-CM | POA: Diagnosis not present

## 2018-01-11 DIAGNOSIS — H33101 Unspecified retinoschisis, right eye: Secondary | ICD-10-CM | POA: Diagnosis not present

## 2018-01-11 DIAGNOSIS — H35373 Puckering of macula, bilateral: Secondary | ICD-10-CM | POA: Diagnosis not present

## 2018-01-27 ENCOUNTER — Ambulatory Visit (INDEPENDENT_AMBULATORY_CARE_PROVIDER_SITE_OTHER): Payer: Medicare Other | Admitting: Psychology

## 2018-01-27 DIAGNOSIS — F4321 Adjustment disorder with depressed mood: Secondary | ICD-10-CM | POA: Diagnosis not present

## 2018-02-08 DIAGNOSIS — H35373 Puckering of macula, bilateral: Secondary | ICD-10-CM | POA: Diagnosis not present

## 2018-02-18 ENCOUNTER — Encounter: Payer: Self-pay | Admitting: Internal Medicine

## 2018-02-18 ENCOUNTER — Ambulatory Visit (INDEPENDENT_AMBULATORY_CARE_PROVIDER_SITE_OTHER): Payer: Medicare Other | Admitting: Internal Medicine

## 2018-02-18 VITALS — BP 122/84 | HR 76 | Temp 97.6°F | Ht 66.0 in | Wt 167.0 lb

## 2018-02-18 DIAGNOSIS — K602 Anal fissure, unspecified: Secondary | ICD-10-CM

## 2018-02-18 DIAGNOSIS — R739 Hyperglycemia, unspecified: Secondary | ICD-10-CM | POA: Diagnosis not present

## 2018-02-18 DIAGNOSIS — K5900 Constipation, unspecified: Secondary | ICD-10-CM | POA: Diagnosis not present

## 2018-02-18 MED ORDER — LACTULOSE 20 GM/30ML PO SOLN: ORAL | 5 refills | Status: DC

## 2018-02-18 NOTE — Assessment & Plan Note (Signed)
Mild persistent it seems, to cont the lactulose, and declines general surgury referral

## 2018-02-18 NOTE — Progress Notes (Signed)
Subjective:    Patient ID: Ricky Kelly, male    DOB: June 25, 1950, 68 y.o.   MRN: [id number]  HPI  Here to f/u "hemorrhoid" x 1 yr, but means he wants to check his fissure, but still has some pain.  Lactulose still helps but causes a lot of gas. Denies worsening reflux, abd pain, dysphagia, n/v, bowel change or blood.  Pt denies chest pain, increased sob or doe, wheezing, orthopnea, PND, increased LE swelling, palpitations, dizziness or syncope.  Pt continues to have recurring LBP without change in severity, bowel or bladder change, fever, wt loss,  worsening LE pain/numbness/weakness, gait change or falls. Past Medical History:  Diagnosis Date  . Adjustment disorder 1/10   with depressive symptoms  . Allergic rhinitis   . Anxiety   . Arthritis   . Chronic cervical radiculopathy 02/02/2013  . Chronic low back pain 08/12/2010  . Depression   . Hearing loss    sensorineural  bilateral  . Hemorrhoids   . HOH (hard of hearing)   . Hyperlipidemia   . Hyperlipidemia   . Lumbago   . Psoriasis   . Retinal tear    bilateral no surgery  . Shingles    Past Surgical History:  Procedure Laterality Date  . CATARACT EXTRACTION Left   . detatched retina Left    laser surgery  . EYE SURGERY    . HEMORRHOID SURGERY      reports that he has never smoked. He has never used smokeless tobacco. He reports that he does not drink alcohol or use drugs. family history includes Cancer in his father and mother; Diabetes in his unknown relative. Allergies  Allergen Reactions  . Allegra [Fexofenadine Hcl] Other (See Comments)    constipation  . Escitalopram Oxalate     constipation  . Naproxen     constipation  . Statins Other (See Comments)    constipation   Current Outpatient Medications on File Prior to Visit  Medication Sig Dispense Refill  . ALPRAZolam (XANAX) 1 MG tablet Take 1 tablet (1 mg total) by mouth at bedtime as needed for anxiety. 30 tablet 5  . Calcitriol (VECTICAL) 3 MCG/GM  cream Apply topically at bedtime.      . dimenhyDRINATE (DRAMAMINE) 50 MG tablet Take 25 mg by mouth daily.    . hydrocortisone (ANUSOL-HC) 25 MG suppository Place 1 suppository (25 mg total) rectally every 12 (twelve) hours. 12 suppository 1  . ibuprofen (ADVIL,MOTRIN) 800 MG tablet TAKE 1 TABLET BY MOUTH EVERY 8 HOURS AS NEEDED 60 tablet 5  . Polyethyl Glycol-Propyl Glycol (SYSTANE OP) Apply to eye 3 (three) times daily.    Marland Kitchen tiZANidine (ZANAFLEX) 4 MG tablet Take 1 tablet (4 mg total) by mouth every 6 (six) hours as needed for muscle spasms. 30 tablet 0   No current facility-administered medications on file prior to visit.    Review of Systems  Constitutional: Negative for other unusual diaphoresis or sweats HENT: Negative for ear discharge or swelling Eyes: Negative for other worsening visual disturbances Respiratory: Negative for stridor or other swelling  Gastrointestinal: Negative for worsening distension or other blood Genitourinary: Negative for retention or other urinary change Musculoskeletal: Negative for other MSK pain or swelling Skin: Negative for color change or other new lesions Neurological: Negative for worsening tremors and other numbness  Psychiatric/Behavioral: Negative for worsening agitation or other fatigue All other system neg per pt    Objective:   Physical Exam BP 122/84   Pulse 76  Temp 97.6 F (36.4 C) (Oral)   Ht 5\' 6"  (1.676 m)   Wt 167 lb (75.8 kg)   SpO2 98%   BMI 26.95 kg/m  VS noted, not ill appaering Constitutional: Pt appears in NAD HENT: Head: NCAT.  Right Ear: External ear normal.  Left Ear: External ear normal.  Eyes: . Pupils are equal, round, and reactive to light. Conjunctivae and EOM are normal Nose: without d/c or deformity Neck: Neck supple. Gross normal ROM Cardiovascular: Normal rate and regular rhythm.   Pulmonary/Chest: Effort normal and breath sounds without rales or wheezing.  Abd:  Soft, NT, ND, + BS, no  organomegaly Rectal: visual only (declines DRE) with possible persistent 6oclock unhealed fissuring Neurological: Pt is alert. At baseline orientation, motor grossly intact Skin: Skin is warm. No rashes, other new lesions, no LE edema Psychiatric: Pt behavior is normal without agitation  No other exam findings Lab Results  Component Value Date   WBC 7.1 09/23/2017   HGB 14.7 09/23/2017   HCT 42.9 09/23/2017   PLT 169.0 09/23/2017   GLUCOSE 101 (H) 09/23/2017   CHOL 190 09/23/2017   TRIG 134.0 09/23/2017   HDL 42.40 09/23/2017   LDLDIRECT 100.0 09/18/2016   LDLCALC 121 (H) 09/23/2017   ALT 18 09/23/2017   AST 20 09/23/2017   NA 135 09/23/2017   K 4.2 09/23/2017   CL 99 09/23/2017   CREATININE 1.00 09/23/2017   BUN 9 09/23/2017   CO2 30 09/23/2017   TSH 1.75 09/23/2017   PSA 1.09 09/23/2017   HGBA1C 5.5 09/23/2017       Assessment & Plan:

## 2018-02-18 NOTE — Patient Instructions (Signed)
Please continue all other medications as before, and refills have been done if requested.  Please have the pharmacy call with any other refills you may need.  Please keep your appointments with your specialists as you may have planned  Please call if you want the General Surgury referral

## 2018-02-18 NOTE — Assessment & Plan Note (Signed)
stable overall by history and exam, recent data reviewed with pt, and pt to continue medical treatment as before,  to f/u any worsening symptoms or concerns  

## 2018-02-18 NOTE — Assessment & Plan Note (Signed)
To continue the lactulose asd,  to f/u any worsening symptoms or concerns

## 2018-02-23 ENCOUNTER — Telehealth: Payer: Self-pay

## 2018-02-23 DIAGNOSIS — K602 Anal fissure, unspecified: Secondary | ICD-10-CM

## 2018-02-23 NOTE — Telephone Encounter (Signed)
Ok for general surgury referral  Not sure why the lactulose changed, but ok to take the dose that normally works for him

## 2018-02-23 NOTE — Telephone Encounter (Signed)
Copied from [place] [id number]. Topic: General - Other >> Feb 23, 2018 10:55 AM Oneta Rack wrote: Relation to pt: self  Call back number: [phone] Pharmacy: [facility] [place] [phone] (Phone) [phone] (Fax)  Reason for call:  Patient was last seen 02/18/2018 and would like to know why the dosage for Lactulose 20 GM/30ML SOLN  was doubled. Patient would like to move forward with surgeon referral, please advise patient directly

## 2018-02-23 NOTE — Addendum Note (Signed)
Addended by: Biagio Borg on: 02/23/2018 01:08 PM   Modules accepted: Orders

## 2018-03-01 ENCOUNTER — Other Ambulatory Visit: Payer: Self-pay | Admitting: Internal Medicine

## 2018-03-11 ENCOUNTER — Ambulatory Visit: Payer: Medicare Other | Admitting: Psychology

## 2018-03-24 ENCOUNTER — Ambulatory Visit (INDEPENDENT_AMBULATORY_CARE_PROVIDER_SITE_OTHER): Payer: Medicare Other | Admitting: Psychology

## 2018-03-24 DIAGNOSIS — F330 Major depressive disorder, recurrent, mild: Secondary | ICD-10-CM

## 2018-03-25 ENCOUNTER — Ambulatory Visit (INDEPENDENT_AMBULATORY_CARE_PROVIDER_SITE_OTHER): Payer: Medicare Other | Admitting: Internal Medicine

## 2018-03-25 ENCOUNTER — Encounter: Payer: Self-pay | Admitting: Internal Medicine

## 2018-03-25 VITALS — BP 120/74 | HR 82 | Temp 98.2°F | Ht 66.0 in | Wt 166.0 lb

## 2018-03-25 DIAGNOSIS — E785 Hyperlipidemia, unspecified: Secondary | ICD-10-CM | POA: Diagnosis not present

## 2018-03-25 DIAGNOSIS — F329 Major depressive disorder, single episode, unspecified: Secondary | ICD-10-CM | POA: Diagnosis not present

## 2018-03-25 DIAGNOSIS — R739 Hyperglycemia, unspecified: Secondary | ICD-10-CM | POA: Diagnosis not present

## 2018-03-25 DIAGNOSIS — F32A Depression, unspecified: Secondary | ICD-10-CM

## 2018-03-25 MED ORDER — ALPRAZOLAM 1 MG PO TABS: 1.0000 mg | ORAL_TABLET | Freq: Every evening | ORAL | 1 refills | Status: DC | PRN

## 2018-03-25 NOTE — Assessment & Plan Note (Signed)
Declines statin, for contd low chol diet

## 2018-03-25 NOTE — Progress Notes (Signed)
Subjective:    Patient ID: Ricky Kelly, male    DOB: 02-18-1950, 68 y.o.   MRN: [id number]  HPI  Here to f/u; overall doing ok,  Pt denies chest pain, increasing sob or doe, wheezing, orthopnea, PND, increased LE swelling, palpitations, dizziness or syncope.  Pt denies new neurological symptoms such as new headache, or facial or extremity weakness or numbness.  Pt denies polydipsia, polyuria, or low sugar episode.  Pt states overall good compliance with meds, mostly trying to follow appropriate diet, with wt overall stable,  but little exercise however.  Saw therapist yesterday, f/u 1 per month.  Pt hopes he passes on before 2017-05-12 so his kids get the life insurance.  No new complaints, has chronic pain and depression no change, no SI or HI  Past Medical History:  Diagnosis Date  . Adjustment disorder 1/10   with depressive symptoms  . Allergic rhinitis   . Anxiety   . Arthritis   . Chronic cervical radiculopathy 02/02/2013  . Chronic low back pain 08/12/2010  . Depression   . Hearing loss    sensorineural  bilateral  . Hemorrhoids   . HOH (hard of hearing)   . Hyperlipidemia   . Hyperlipidemia   . Lumbago   . Psoriasis   . Retinal tear    bilateral no surgery  . Shingles    Past Surgical History:  Procedure Laterality Date  . CATARACT EXTRACTION Left   . detatched retina Left    laser surgery  . EYE SURGERY    . HEMORRHOID SURGERY      reports that he has never smoked. He has never used smokeless tobacco. He reports that he does not drink alcohol or use drugs. family history includes Cancer in his father and mother; Diabetes in his unknown relative. Allergies  Allergen Reactions  . Allegra [Fexofenadine Hcl] Other (See Comments)    constipation  . Escitalopram Oxalate     constipation  . Naproxen     constipation  . Statins Other (See Comments)    constipation   Current Outpatient Medications on File Prior to Visit  Medication Sig Dispense Refill  .  Calcitriol (VECTICAL) 3 MCG/GM cream Apply topically at bedtime.      . dimenhyDRINATE (DRAMAMINE) 50 MG tablet Take 25 mg by mouth daily.    . hydrocortisone (ANUSOL-HC) 25 MG suppository Place 1 suppository (25 mg total) rectally every 12 (twelve) hours. 12 suppository 1  . ibuprofen (ADVIL,MOTRIN) 800 MG tablet TAKE 1 TABLET BY MOUTH EVERY 8 HOURS AS NEEDED 60 tablet 5  . Lactulose 20 GM/30ML SOLN TAKE 30 MLS BY MOUTH TWICE DAILY AS NEEDED FOR CONSTIPATION 948 mL 0  . Polyethyl Glycol-Propyl Glycol (SYSTANE OP) Apply to eye 3 (three) times daily.    Marland Kitchen tiZANidine (ZANAFLEX) 4 MG tablet Take 1 tablet (4 mg total) by mouth every 6 (six) hours as needed for muscle spasms. 30 tablet 0   No current facility-administered medications on file prior to visit.    Review of Systems  Constitutional: Negative for other unusual diaphoresis or sweats HENT: Negative for ear discharge or swelling Eyes: Negative for other worsening visual disturbances Respiratory: Negative for stridor or other swelling  Gastrointestinal: Negative for worsening distension or other blood Genitourinary: Negative for retention or other urinary change Musculoskeletal: Negative for other MSK pain or swelling Skin: Negative for color change or other new lesions Neurological: Negative for worsening tremors and other numbness  Psychiatric/Behavioral: Negative for worsening agitation  or other fatigue All other system neg per pt    Objective:   Physical Exam BP 120/74   Pulse 82   Temp 98.2 F (36.8 C) (Oral)   Ht 5\' 6"  (1.676 m)   Wt 166 lb (75.3 kg)   SpO2 97%   BMI 26.79 kg/m  VS noted,  Constitutional: Pt appears in NAD HENT: Head: NCAT.  Right Ear: External ear normal.  Left Ear: External ear normal.  Eyes: . Pupils are equal, round, and reactive to light. Conjunctivae and EOM are normal Nose: without d/c or deformity Neck: Neck supple. Gross normal ROM Cardiovascular: Normal rate and regular rhythm.     Pulmonary/Chest: Effort normal and breath sounds without rales or wheezing.  Abd:  Soft, NT, ND, + BS, no organomegaly Neurological: Pt is alert. At baseline orientation, motor grossly intact Skin: Skin is warm. No rashes, other new lesions, no LE edema Psychiatric: Pt behavior is normal without agitation  No other exam findings Lab Results  Component Value Date   WBC 7.1 09/23/2017   HGB 14.7 09/23/2017   HCT 42.9 09/23/2017   PLT 169.0 09/23/2017   GLUCOSE 101 (H) 09/23/2017   CHOL 190 09/23/2017   TRIG 134.0 09/23/2017   HDL 42.40 09/23/2017   LDLDIRECT 100.0 09/18/2016   LDLCALC 121 (H) 09/23/2017   ALT 18 09/23/2017   AST 20 09/23/2017   NA 135 09/23/2017   K 4.2 09/23/2017   CL 99 09/23/2017   CREATININE 1.00 09/23/2017   BUN 9 09/23/2017   CO2 30 09/23/2017   TSH 1.75 09/23/2017   PSA 1.09 09/23/2017   HGBA1C 5.5 09/23/2017        Assessment & Plan:

## 2018-03-25 NOTE — Assessment & Plan Note (Signed)
stable overall by history and exam, recent data reviewed with pt, and pt to continue medical treatment as before,  to f/u any worsening symptoms or concerns  

## 2018-03-25 NOTE — Patient Instructions (Addendum)
You had the Prevnar 13 pneumonia shot today  Please continue all other medications as before, and refills have been done if requested.  Please have the pharmacy call with any other refills you may need.  Please continue your efforts at being more active, low cholesterol diet, and weight control.  You are otherwise up to date with prevention measures today.  Please keep your appointments with your specialists as you may have planned  Please return in 6 months, or sooner if needed

## 2018-03-29 ENCOUNTER — Encounter: Payer: Self-pay | Admitting: Surgery

## 2018-03-29 DIAGNOSIS — K5909 Other constipation: Secondary | ICD-10-CM | POA: Diagnosis not present

## 2018-03-29 DIAGNOSIS — K602 Anal fissure, unspecified: Secondary | ICD-10-CM | POA: Diagnosis not present

## 2018-05-12 ENCOUNTER — Telehealth: Payer: Self-pay | Admitting: Internal Medicine

## 2018-05-12 MED ORDER — ONDANSETRON HCL 4 MG PO TABS: 4.0000 mg | ORAL_TABLET | Freq: Three times a day (TID) | ORAL | 1 refills | Status: DC | PRN

## 2018-05-12 NOTE — Telephone Encounter (Signed)
Please advise. Medication is not listed on current medications that he is taking. Should the pt be scheduled for a video visit?

## 2018-05-12 NOTE — Addendum Note (Signed)
Addended by: Biagio Borg on: 05/12/2018 04:12 PM   Modules accepted: Orders

## 2018-05-12 NOTE — Telephone Encounter (Signed)
zofran done  erx

## 2018-05-12 NOTE — Telephone Encounter (Signed)
Copied from [place] [id number]. Topic: Quick Communication - Rx Refill/Question >> May 12, 2018  2:01 PM Reyne Dumas L wrote: Medication: Medication for nausea, Ondansetron 4 mg tablet   Has the patient contacted their pharmacy? No - last got through urgent care, pt doesn't have RX coverage so the most inexpensive generic is what pt would like. (Agent: If no, request that the patient contact the pharmacy for the refill.) (Agent: If yes, when and what did the pharmacy advise?)  Preferred Pharmacy (with phone number or street name): [facility] ([address] [place] [phone] (Phone) [phone] (Fax)  Agent: Please be advised that RX refills may take up to 3 business days. We ask that you follow-up with your pharmacy.

## 2018-05-19 ENCOUNTER — Telehealth: Payer: Self-pay | Admitting: *Deleted

## 2018-05-19 NOTE — Telephone Encounter (Signed)
LVM for patient to call back in regards to scheduling AWV with our health coach. Discussed doing a virtual visit and asked patient to call nurse back at [phone] to schedule.

## 2018-05-20 ENCOUNTER — Ambulatory Visit (INDEPENDENT_AMBULATORY_CARE_PROVIDER_SITE_OTHER): Payer: Medicare Other | Admitting: Psychology

## 2018-05-20 DIAGNOSIS — F330 Major depressive disorder, recurrent, mild: Secondary | ICD-10-CM | POA: Diagnosis not present

## 2018-06-21 ENCOUNTER — Ambulatory Visit: Payer: Self-pay | Admitting: *Deleted

## 2018-06-21 ENCOUNTER — Ambulatory Visit: Payer: Self-pay

## 2018-06-21 ENCOUNTER — Telehealth: Payer: Self-pay | Admitting: *Deleted

## 2018-06-21 MED ORDER — PROMETHAZINE HCL 25 MG PO TABS: 25.0000 mg | ORAL_TABLET | Freq: Four times a day (QID) | ORAL | 2 refills | Status: DC | PRN

## 2018-06-21 NOTE — Addendum Note (Signed)
Addended by: Biagio Borg on: 06/21/2018 03:07 PM   Modules accepted: Orders

## 2018-06-21 NOTE — Telephone Encounter (Signed)
Pt c/o abdominal discomfort, N&V once on the morning of 06/13/18. He was nauseated for "18 straight hours". Sunday evening at 5 pm he had temp of 99.7 that resolved. Monday am the nausea had subsided and he was able to hydrate. He felt very weak for a few days. He c/o throat was very dry. He was able to eat solid foods on Wednesday. He states he still not 100%.  He would like to know what Dr. Jenny Reichmann thinks was wrong with him. He would like a phone call or a virtual visit. He does not want to come in to office. Please advise.

## 2018-06-21 NOTE — Telephone Encounter (Signed)
[place] for virtual visit Tuesday June 2, thanks

## 2018-06-21 NOTE — Telephone Encounter (Signed)
Pt informed of below.  

## 2018-06-21 NOTE — Telephone Encounter (Addendum)
Pt requesting nausea medication different from Zofran, pt stated when he was nauseated, the medication did not work. Pt requesting different medication for nausea to have on hand. Pt stated that twice per year he has unexplained nausea.  At time of call, pt no longer nauseated and eating a regular diet.   Reason for Disposition . Unexplained nausea  Answer Assessment - Initial Assessment Questions 1. NAUSEA SEVERITY: "How bad is the nausea?" (e.g., mild, moderate, severe; dehydration, weight loss)   - MILD: loss of appetite without change in eating habits   - MODERATE: decreased oral intake without significant weight loss, dehydration, or malnutrition   - SEVERE: inadequate caloric or fluid intake, significant weight loss, symptoms of dehydration     No nausea since last 5 days  2. ONSET: "When did the nausea begin?"     Last 5 days 3. VOMITING: "Any vomiting?" If so, ask: "How many times today?"      Last Sunday 4. RECURRENT SYMPTOM: "Have you had nausea before?" If so, ask: "When was the last time?" "What happened that time?"     Yes-unknown when last episode occurred- once or twice a year has nausea with unknown etiology 5. CAUSE: "What do you think is causing the nausea?"     unknown 6. PREGNANCY: "Is there any chance you are pregnant?" (e.g., unprotected intercourse, missed birth control pill, broken condom)     n/a  Protocols used: NAUSEA-A-AH

## 2018-06-21 NOTE — Telephone Encounter (Signed)
Done erx 

## 2018-06-21 NOTE — Telephone Encounter (Signed)
Message from Beverley Fiedler sent at 06/21/2018 10:21 AM EDT   Summary: Clinical Call   Patient stated that he has been vomiting and nauseous for 1 week. Patient stated that the Zofran didn't help at all. Patient would like OTC recommendations         See 2nd triage encounter from 06/21/18

## 2018-06-22 ENCOUNTER — Ambulatory Visit (INDEPENDENT_AMBULATORY_CARE_PROVIDER_SITE_OTHER): Payer: Medicare Other | Admitting: Internal Medicine

## 2018-06-22 ENCOUNTER — Encounter: Payer: Self-pay | Admitting: Internal Medicine

## 2018-06-22 ENCOUNTER — Other Ambulatory Visit: Payer: Self-pay

## 2018-06-22 DIAGNOSIS — B349 Viral infection, unspecified: Secondary | ICD-10-CM | POA: Diagnosis not present

## 2018-06-22 DIAGNOSIS — R739 Hyperglycemia, unspecified: Secondary | ICD-10-CM | POA: Diagnosis not present

## 2018-06-22 DIAGNOSIS — J309 Allergic rhinitis, unspecified: Secondary | ICD-10-CM | POA: Diagnosis not present

## 2018-06-22 NOTE — Progress Notes (Signed)
Patient ID: Ricky Kelly, male   DOB: 02/28/1950, 68 y.o.   MRN: [id number]  Virtual Visit via Video Note  I connected with Ricky Kelly on 06/22/18 at 11:20 AM EDT by a video enabled telemedicine application and verified that I am speaking with the correct person using two identifiers.  Location: Patient: at home Provider: at office   I discussed the limitations of evaluation and management by telemedicine and the availability of in person appointments. The patient expressed understanding and agreed to proceed.  History of Present Illness: Here with c/o 9 days feeling ill starting with a low grade temp 99 the first day (normal since then) but with persistent nausea intermittent mild but did vomit x 1 about 1 wk ago.  Has been weak and dizzy as well at times but no falls and Pt denies chest pain, increased sob or doe, wheezing, orthopnea, PND, increased LE swelling, palpitations, dizziness or syncope.  Has been fortunate to have some less nausea in last 2 days as zofran not helping.  Has been able to advance diet to regular yesterday and today.  Denies worsening reflux, abd pain, dysphagia, bowel change or blood.  Has had mild nasal congestion last 2 days but no pain or fever, clear color, no cough or ST.  Pt denies polydipsia, polyuria    Past Medical History:  Diagnosis Date  . Adjustment disorder 1/10   with depressive symptoms  . Allergic rhinitis   . Anxiety   . Arthritis   . Chronic cervical radiculopathy 02/02/2013  . Chronic low back pain 08/12/2010  . Depression   . Hearing loss    sensorineural  bilateral  . Hemorrhoids   . HOH (hard of hearing)   . Hyperlipidemia   . Hyperlipidemia   . Lumbago   . Psoriasis   . Retinal tear    bilateral no surgery  . Shingles    Past Surgical History:  Procedure Laterality Date  . CATARACT EXTRACTION Left   . detatched retina Left    laser surgery  . EYE SURGERY    . HEMORRHOID SURGERY      reports that he has never  smoked. He has never used smokeless tobacco. He reports that he does not drink alcohol or use drugs. family history includes Cancer in his father and mother; Diabetes in his unknown relative. Allergies  Allergen Reactions  . Allegra [Fexofenadine Hcl] Other (See Comments)    constipation  . Escitalopram Oxalate     constipation  . Naproxen     constipation  . Statins Other (See Comments)    constipation   Current Outpatient Medications on File Prior to Visit  Medication Sig Dispense Refill  . ALPRAZolam (XANAX) 1 MG tablet Take 1 tablet (1 mg total) by mouth at bedtime as needed for anxiety. 90 tablet 1  . Calcitriol (VECTICAL) 3 MCG/GM cream Apply topically at bedtime.      . dimenhyDRINATE (DRAMAMINE) 50 MG tablet Take 25 mg by mouth daily.    . hydrocortisone (ANUSOL-HC) 25 MG suppository Place 1 suppository (25 mg total) rectally every 12 (twelve) hours. 12 suppository 1  . ibuprofen (ADVIL,MOTRIN) 800 MG tablet TAKE 1 TABLET BY MOUTH EVERY 8 HOURS AS NEEDED 60 tablet 5  . Lactulose 20 GM/30ML SOLN TAKE 30 MLS BY MOUTH TWICE DAILY AS NEEDED FOR CONSTIPATION 948 mL 0  . Polyethyl Glycol-Propyl Glycol (SYSTANE OP) Apply to eye 3 (three) times daily.    . promethazine (PHENERGAN) 25 MG tablet  Take 1 tablet (25 mg total) by mouth every 6 (six) hours as needed for up to 7 days for nausea or vomiting. 30 tablet 2  . tiZANidine (ZANAFLEX) 4 MG tablet Take 1 tablet (4 mg total) by mouth every 6 (six) hours as needed for muscle spasms. 30 tablet 0   No current facility-administered medications on file prior to visit.     Observations/Objective: Alert, NAD, appropriate mood and affect, resps normal, cn 2-12 intact, moves all 4s, no visible rash or swelling Lab Results  Component Value Date   WBC 7.1 09/23/2017   HGB 14.7 09/23/2017   HCT 42.9 09/23/2017   PLT 169.0 09/23/2017   GLUCOSE 101 (H) 09/23/2017   CHOL 190 09/23/2017   TRIG 134.0 09/23/2017   HDL 42.40 09/23/2017   LDLDIRECT  100.0 09/18/2016   LDLCALC 121 (H) 09/23/2017   ALT 18 09/23/2017   AST 20 09/23/2017   NA 135 09/23/2017   K 4.2 09/23/2017   CL 99 09/23/2017   CREATININE 1.00 09/23/2017   BUN 9 09/23/2017   CO2 30 09/23/2017   TSH 1.75 09/23/2017   PSA 1.09 09/23/2017   HGBA1C 5.5 09/23/2017   Assessment and Plan: See notes  Follow Up Instructions: See notes   I discussed the assessment and treatment plan with the patient. The patient was provided an opportunity to ask questions and all were answered. The patient agreed with the plan and demonstrated an understanding of the instructions.   The patient was advised to call back or seek an in-person evaluation if the symptoms worsen or if the condition fails to improve as anticipated.   Cathlean Cower, MD

## 2018-06-22 NOTE — Telephone Encounter (Signed)
Patient scheduled.

## 2018-06-23 ENCOUNTER — Ambulatory Visit (INDEPENDENT_AMBULATORY_CARE_PROVIDER_SITE_OTHER): Payer: Medicare Other | Admitting: Psychology

## 2018-06-23 DIAGNOSIS — F330 Major depressive disorder, recurrent, mild: Secondary | ICD-10-CM

## 2018-06-23 DIAGNOSIS — B349 Viral infection, unspecified: Secondary | ICD-10-CM | POA: Insufficient documentation

## 2018-06-23 NOTE — Assessment & Plan Note (Signed)
Lasting over last wk, essentially resolved in last 2 days, pt reassured, no specific eval or tx needed

## 2018-06-23 NOTE — Patient Instructions (Signed)
[place] for OTC nasacort for allergies  Please continue all other medications as before, and refills have been done if requested.  Please have the pharmacy call with any other refills you may need.  Please continue your efforts at being more active, low cholesterol diet, and weight control.  Please keep your appointments with your specialists as you may have planned

## 2018-06-23 NOTE — Assessment & Plan Note (Signed)
Mild to mod, for nasacort asd,  to f/u any worsening symptoms or concerns 

## 2018-06-23 NOTE — Assessment & Plan Note (Signed)
stable overall by history and exam, recent data reviewed with pt, and pt to continue medical treatment as before,  to f/u any worsening symptoms or concerns  

## 2018-06-25 DIAGNOSIS — H1132 Conjunctival hemorrhage, left eye: Secondary | ICD-10-CM | POA: Diagnosis not present

## 2018-07-11 ENCOUNTER — Other Ambulatory Visit: Payer: Self-pay | Admitting: Family

## 2018-07-13 ENCOUNTER — Ambulatory Visit (INDEPENDENT_AMBULATORY_CARE_PROVIDER_SITE_OTHER): Payer: Medicare Other | Admitting: Internal Medicine

## 2018-07-13 ENCOUNTER — Other Ambulatory Visit: Payer: Self-pay

## 2018-07-13 DIAGNOSIS — M545 Low back pain: Secondary | ICD-10-CM

## 2018-07-13 DIAGNOSIS — F32A Depression, unspecified: Secondary | ICD-10-CM

## 2018-07-13 DIAGNOSIS — F329 Major depressive disorder, single episode, unspecified: Secondary | ICD-10-CM

## 2018-07-13 DIAGNOSIS — R739 Hyperglycemia, unspecified: Secondary | ICD-10-CM

## 2018-07-13 DIAGNOSIS — G8929 Other chronic pain: Secondary | ICD-10-CM

## 2018-07-13 MED ORDER — TIZANIDINE HCL 4 MG PO TABS: 4.0000 mg | ORAL_TABLET | Freq: Four times a day (QID) | ORAL | 1 refills | Status: DC | PRN

## 2018-07-13 NOTE — Patient Instructions (Signed)
Please take all new medication as prescribed - the muscle relaxer  Please continue all other medications as before, and refills have been done if requested.  Please have the pharmacy call with any other refills you may need.  Please continue your efforts at being more active, low cholesterol diet, and weight control.  Please keep your appointments with your specialists as you may have planned

## 2018-07-15 ENCOUNTER — Encounter: Payer: Self-pay | Admitting: Internal Medicine

## 2018-07-15 NOTE — Assessment & Plan Note (Signed)
stable overall by history and exam, recent data reviewed with pt, and pt to continue medical treatment as before,  to f/u any worsening symptoms or concerns  

## 2018-07-15 NOTE — Progress Notes (Signed)
Patient ID: Ricky Kelly, male   DOB: [birth year], 68 y.o.   MRN: [id number]   Virtual Visit via Video Note  I connected with [name] on 07/15/18 at  4:00 PM EDT by a video enabled telemedicine application and verified that I am speaking with the correct person using two identifiers.  Location: Patient: at home Provider: at office   I discussed the limitations of evaluation and management by telemedicine and the availability of in person appointments. The patient expressed understanding and agreed to proceed.  History of Present Illness: Here with 3 days worsening acute on chronic LBP more left sided this time with radiation to the left buttock, but Pt no bowel or bladder change, fever, wt loss, worsening LE pain/numbness/weakness, gait change or falls.  Began with a twisting sort of bending motion a few days ago.  Dull and sharp, moderate, intermittent, worse to bend, better to lie down.  Denies worsening reflux, abd pain, dysphagia, n/v, bowel change or blood.  Denies urinary symptoms such as dysuria, frequency, urgency, flank pain, hematuria or n/v, fever, chills.   Pt denies polydipsia, polyuria.  Denies worsening depressive symptoms, suicidal ideation, or panic Past Medical History:  Diagnosis Date  . Adjustment disorder 1/10   with depressive symptoms  . Allergic rhinitis   . Anxiety   . Arthritis   . Chronic cervical radiculopathy 02/02/2013  . Chronic low back pain 08/12/2010  . Depression   . Hearing loss    sensorineural  bilateral  . Hemorrhoids   . HOH (hard of hearing)   . Hyperlipidemia   . Hyperlipidemia   . Lumbago   . Psoriasis   . Retinal tear    bilateral no surgery  . Shingles    Past Surgical History:  Procedure Laterality Date  . CATARACT EXTRACTION Left   . detatched retina Left    laser surgery  . EYE SURGERY    . HEMORRHOID SURGERY      reports that he has never smoked. He has never used smokeless tobacco. He reports that he does not  drink alcohol or use drugs. family history includes Cancer in his father and mother; Diabetes in his unknown relative. Allergies  Allergen Reactions  . Allegra [Fexofenadine Hcl] Other (See Comments)    constipation  . Escitalopram Oxalate     constipation  . Naproxen     constipation  . Statins Other (See Comments)    constipation   Current Outpatient Medications on File Prior to Visit  Medication Sig Dispense Refill  . ALPRAZolam (XANAX) 1 MG tablet Take 1 tablet (1 mg total) by mouth at bedtime as needed for anxiety. 90 tablet 1  . Calcitriol (VECTICAL) 3 MCG/GM cream Apply topically at bedtime.      . dimenhyDRINATE (DRAMAMINE) 50 MG tablet Take 25 mg by mouth daily.    . hydrocortisone (ANUSOL-HC) 25 MG suppository Place 1 suppository (25 mg total) rectally every 12 (twelve) hours. 12 suppository 1  . ibuprofen (ADVIL,MOTRIN) 800 MG tablet TAKE 1 TABLET BY MOUTH EVERY 8 HOURS AS NEEDED 60 tablet 5  . Lactulose 20 GM/30ML SOLN TAKE 30 MLS BY MOUTH TWICE DAILY AS NEEDED FOR CONSTIPATION 948 mL 0  . Polyethyl Glycol-Propyl Glycol (SYSTANE OP) Apply to eye 3 (three) times daily.    . promethazine (PHENERGAN) 25 MG tablet Take 1 tablet (25 mg total) by mouth every 6 (six) hours as needed for up to 7 days for nausea or vomiting. 30 tablet 2  No current facility-administered medications on file prior to visit.     Observations/Objective: Alert, NAD, appropriate mood and affect, resps normal, cn 2-12 intact, moves all 4s, no visible rash or swelling Lab Results  Component Value Date   WBC 7.1 09/23/2017   HGB 14.7 09/23/2017   HCT 42.9 09/23/2017   PLT 169.0 09/23/2017   GLUCOSE 101 (H) 09/23/2017   CHOL 190 09/23/2017   TRIG 134.0 09/23/2017   HDL 42.40 09/23/2017   LDLDIRECT 100.0 09/18/2016   LDLCALC 121 (H) 09/23/2017   ALT 18 09/23/2017   AST 20 09/23/2017   NA 135 09/23/2017   K 4.2 09/23/2017   CL 99 09/23/2017   CREATININE 1.00 09/23/2017   BUN 9 09/23/2017   CO2  30 09/23/2017   TSH 1.75 09/23/2017   PSA 1.09 09/23/2017   HGBA1C 5.5 09/23/2017   Assessment and Plan: See notes  Follow Up Instructions: See notes   I discussed the assessment and treatment plan with the patient. The patient was provided an opportunity to ask questions and all were answered. The patient agreed with the plan and demonstrated an understanding of the instructions.   The patient was advised to call back or seek an in-person evaluation if the symptoms worsen or if the condition fails to improve as anticipated.   Cathlean Cower, MD

## 2018-07-15 NOTE — Assessment & Plan Note (Signed)
With mild to mod worsening pain, to add muscle relaxer prn, cont all other tx,  to f/u any worsening symptoms or concerns

## 2018-07-28 ENCOUNTER — Ambulatory Visit (INDEPENDENT_AMBULATORY_CARE_PROVIDER_SITE_OTHER): Payer: Medicare Other | Admitting: Psychology

## 2018-07-28 DIAGNOSIS — F330 Major depressive disorder, recurrent, mild: Secondary | ICD-10-CM

## 2018-08-25 DIAGNOSIS — H40013 Open angle with borderline findings, low risk, bilateral: Secondary | ICD-10-CM | POA: Diagnosis not present

## 2018-08-25 DIAGNOSIS — H1859 Other hereditary corneal dystrophies: Secondary | ICD-10-CM | POA: Diagnosis not present

## 2018-08-25 DIAGNOSIS — Z961 Presence of intraocular lens: Secondary | ICD-10-CM | POA: Diagnosis not present

## 2018-08-25 DIAGNOSIS — H04123 Dry eye syndrome of bilateral lacrimal glands: Secondary | ICD-10-CM | POA: Diagnosis not present

## 2018-09-01 ENCOUNTER — Ambulatory Visit (INDEPENDENT_AMBULATORY_CARE_PROVIDER_SITE_OTHER): Payer: Medicare Other | Admitting: Psychology

## 2018-09-01 DIAGNOSIS — F330 Major depressive disorder, recurrent, mild: Secondary | ICD-10-CM

## 2018-09-29 NOTE — Progress Notes (Deleted)
Subjective:   Ricky Kelly is a 68 y.o. male who presents for Medicare Annual/Subsequent preventive examination. I connected with patient by a telephone and verified that I am speaking with the correct person using two identifiers. Patient stated full name and DOB. Patient gave permission to continue with telephonic visit. Patient's location was at home and Nurse's location was at [place] office.   Review of Systems:     Sleep patterns: {SX; SLEEP PATTERNS:18802::"feels rested on waking","does not get up to void","gets up *** times nightly to void","sleeps *** hours nightly"}.    Home Safety/Smoke Alarms: Feels safe in home. Smoke alarms in place.  Living environment; residence and Firearm Safety: {Rehab home environment / accessibility:30080::"no firearms","firearms stored safely"}. Seat Belt Safety/Bike Helmet: Wears seat belt.   PSA-  Lab Results  Component Value Date   PSA 1.09 09/23/2017   PSA 1.05 09/18/2016   PSA 0.76 09/12/2014       Objective:    Vitals: There were no vitals taken for this visit.  There is no height or weight on file to calculate BMI.  Advanced Directives 03/20/2017 10/03/2015  Does Patient Have a Medical Advance Directive? No No  Would patient like information on creating a medical advance directive? Yes (ED - Information included in AVS) No - patient declined information    Tobacco Social History   Tobacco Use  Smoking Status Never Smoker  Smokeless Tobacco Never Used     Counseling given: Not Answered  Past Medical History:  Diagnosis Date  . Adjustment disorder 1/10   with depressive symptoms  . Allergic rhinitis   . Anxiety   . Arthritis   . Chronic cervical radiculopathy 02/02/2013  . Chronic low back pain 08/12/2010  . Depression   . Hearing loss    sensorineural  bilateral  . Hemorrhoids   . HOH (hard of hearing)   . Hyperlipidemia   . Hyperlipidemia   . Lumbago   . Psoriasis   . Retinal tear    bilateral no surgery   . Shingles    Past Surgical History:  Procedure Laterality Date  . CATARACT EXTRACTION Left   . detatched retina Left    laser surgery  . EYE SURGERY    . HEMORRHOID SURGERY     Family History  Problem Relation Age of Onset  . Cancer Father        lung  . Cancer Mother        bladder  . Diabetes Unknown    Social History   Socioeconomic History  . Marital status: Divorced    Spouse name: Not on file  . Number of children: 2  . Years of education: Not on file  . Highest education level: Not on file  Occupational History  . Occupation: disabled    Employer: RETIRED  Social Needs  . Financial resource strain: Somewhat hard  . Food insecurity    Worry: Sometimes true    Inability: Sometimes true  . Transportation needs    Medical: Yes    Non-medical: Yes  Tobacco Use  . Smoking status: Never Smoker  . Smokeless tobacco: Never Used  Substance and Sexual Activity  . Alcohol use: No  . Drug use: No  . Sexual activity: Never  Lifestyle  . Physical activity    Days per week: 0 days    Minutes per session: 0 min  . Stress: Rather much  Relationships  . Social connections    Talks on phone: More than  three times a week    Gets together: More than three times a week    Attends religious service: Not on file    Active member of club or organization: Not on file    Attends meetings of clubs or organizations: Not on file    Relationship status: Divorced  Other Topics Concern  . Not on file  Social History Narrative   Originally from [place], [place]    Outpatient Encounter Medications as of 09/30/2018  Medication Sig  . ALPRAZolam (XANAX) 1 MG tablet Take 1 tablet (1 mg total) by mouth at bedtime as needed for anxiety.  . Calcitriol (VECTICAL) 3 MCG/GM cream Apply topically at bedtime.    . dimenhyDRINATE (DRAMAMINE) 50 MG tablet Take 25 mg by mouth daily.  Marland Kitchen ibuprofen (ADVIL,MOTRIN) 800 MG tablet TAKE 1 TABLET BY MOUTH EVERY 8 HOURS AS NEEDED  . Lactulose 20 GM/30ML  SOLN TAKE 30 MLS BY MOUTH TWICE DAILY AS NEEDED FOR CONSTIPATION  . Polyethyl Glycol-Propyl Glycol (SYSTANE OP) Apply to eye 3 (three) times daily.  . promethazine (PHENERGAN) 25 MG tablet Take 1 tablet (25 mg total) by mouth every 6 (six) hours as needed for up to 7 days for nausea or vomiting.  Marland Kitchen tiZANidine (ZANAFLEX) 4 MG tablet Take 1 tablet (4 mg total) by mouth every 6 (six) hours as needed for muscle spasms.   No facility-administered encounter medications on file as of 09/30/2018.     Activities of Daily Living No flowsheet data found.  Patient Care Team: Biagio Borg, MD as PCP - Nadara Eaton, MD as Consulting Physician (Orthopedic Surgery) Haverstock, Jennefer Bravo, MD as Referring Physician (Dermatology) Shelor Melburn Hake, Rex Kras, LCSW as Social Worker (Licensed Clinical Social Worker) Monna Fam, MD as Consulting Physician (Ophthalmology)   Assessment:   This is a routine wellness examination for Ricky Kelly. Physical assessment deferred to PCP.  Exercise Activities and Dietary recommendations   Diet (meal preparation, eat out, water intake, caffeinated beverages, dairy products, fruits and vegetables): {Desc; diets:16563}    Goals    . Patient Stated     I want to take a vacation with my children. I will sit down and plan a vacation.       Fall Risk Fall Risk  03/25/2018 08/25/2017 03/20/2017 09/18/2016 03/19/2016  Falls in the past year? 0 No Yes No No  Number falls in past yr: - - 1 - -  Injury with Fall? - - No - -  Risk for fall due to : - - Impaired balance/gait;Impaired mobility - -   Is the patient's home free of loose throw rugs in walkways, pet beds, electrical cords, etc?   {Blank single:19197::"yes","no"}      Grab bars in the bathroom? {Blank single:19197::"yes","no"}      Handrails on the stairs?   {Blank single:19197::"yes","no"}      Adequate lighting?   {Blank single:19197::"yes","no"}  Depression Screen PHQ 2/9 Scores 03/25/2018 08/25/2017 03/20/2017  03/20/2017  PHQ - 2 Score 1 1 4 1   PHQ- 9 Score - - 12 -    Cognitive Function        Immunization History  Administered Date(s) Administered  . Influenza Whole 12/03/2007, 10/19/2008, 10/03/2009, 09/25/2010  . Influenza, High Dose Seasonal PF 10/07/2017  . Influenza,inj,Quad PF,6+ Mos 10/20/2012, 10/13/2013, 10/19/2014, 10/04/2015, 10/16/2016  . Pneumococcal Polysaccharide-23 02/15/2014  . Td 05/10/2008  . Zoster Recombinat (Shingrix) 07/21/2016   Screening Tests Health Maintenance  Topic Date Due  . COLONOSCOPY  09/05/2002  .  PNA vac Low Risk Adult (1 of 2 - PCV13) 09/04/2017  . TETANUS/TDAP  05/11/2018  . INFLUENZA VACCINE  08/21/2018  . Hepatitis C Screening  Completed       Plan:     I have personally reviewed and noted the following in the patient's chart:   . Medical and social history . Use of alcohol, tobacco or illicit drugs  . Current medications and supplements . Functional ability and status . Nutritional status . Physical activity . Advanced directives . List of other physicians . Screenings to include cognitive, depression, and falls . Referrals and appointments  In addition, I have reviewed and discussed with patient certain preventive protocols, quality metrics, and best practice recommendations. A written personalized care plan for preventive services as well as general preventive health recommendations were provided to patient.     Michiel Cowboy, RN  09/29/2018

## 2018-09-30 ENCOUNTER — Encounter: Payer: Self-pay | Admitting: Internal Medicine

## 2018-09-30 ENCOUNTER — Ambulatory Visit: Payer: Medicare Other

## 2018-09-30 ENCOUNTER — Ambulatory Visit (INDEPENDENT_AMBULATORY_CARE_PROVIDER_SITE_OTHER): Payer: Medicare Other | Admitting: *Deleted

## 2018-09-30 ENCOUNTER — Ambulatory Visit (INDEPENDENT_AMBULATORY_CARE_PROVIDER_SITE_OTHER): Payer: Medicare Other | Admitting: Internal Medicine

## 2018-09-30 DIAGNOSIS — Z0000 Encounter for general adult medical examination without abnormal findings: Secondary | ICD-10-CM | POA: Diagnosis not present

## 2018-09-30 DIAGNOSIS — E785 Hyperlipidemia, unspecified: Secondary | ICD-10-CM | POA: Diagnosis not present

## 2018-09-30 DIAGNOSIS — F329 Major depressive disorder, single episode, unspecified: Secondary | ICD-10-CM | POA: Diagnosis not present

## 2018-09-30 DIAGNOSIS — R739 Hyperglycemia, unspecified: Secondary | ICD-10-CM | POA: Diagnosis not present

## 2018-09-30 DIAGNOSIS — F32A Depression, unspecified: Secondary | ICD-10-CM

## 2018-09-30 MED ORDER — IBUPROFEN 800 MG PO TABS: 800.0000 mg | ORAL_TABLET | Freq: Three times a day (TID) | ORAL | 5 refills | Status: DC | PRN

## 2018-09-30 MED ORDER — ALPRAZOLAM 1 MG PO TABS: 1.0000 mg | ORAL_TABLET | Freq: Every evening | ORAL | 1 refills | Status: DC | PRN

## 2018-09-30 NOTE — Patient Instructions (Signed)
Please continue all other medications as before, and refills have been done if requested.  Please have the pharmacy call with any other refills you may need.  Please continue your efforts at being more active, low cholesterol diet, and weight control.  You are otherwise up to date with prevention measures today.  Please keep your appointments with your specialists as you may have planned  Please return in 6 months, or sooner if needed 

## 2018-09-30 NOTE — Assessment & Plan Note (Signed)
stable overall by history and exam, recent data reviewed with pt, and pt to continue medical treatment as before,  to f/u any worsening symptoms or concerns  

## 2018-09-30 NOTE — Progress Notes (Addendum)
Subjective:   Ricky Kelly is a 68 y.o. male who presents for Medicare Annual/Subsequent preventive examination. I connected with patient by a telephone and verified that I am speaking with the correct person using two identifiers. Patient stated full name and DOB. Patient gave permission to continue with telephonic visit. Patient's location was at home and Nurse's location was at [place] office.   Review of Systems:     Sleep patterns: has interrupted sleep, gets up 1-2 times nightly to void and sleeps hours varies 5-7 nightly according to pain level. Patient reports insomnia issues, discussed recommended sleep tips.    Home Safety/Smoke Alarms: Feels safe in home. Smoke alarms in place.  Living environment; residence and Firearm Safety: apartment. Lives with son, no needs for DME, good support system Seat Belt Safety/Bike Helmet: Wears seat belt.     Objective:    Vitals: There were no vitals taken for this visit.  There is no height or weight on file to calculate BMI.  Advanced Directives 03/20/2017 10/03/2015  Does Patient Have a Medical Advance Directive? No No  Would patient like information on creating a medical advance directive? Yes (ED - Information included in AVS) No - patient declined information    Tobacco Social History   Tobacco Use  Smoking Status Never Smoker  Smokeless Tobacco Never Used     Counseling given: Not Answered  Past Medical History:  Diagnosis Date  . Adjustment disorder 1/10   with depressive symptoms  . Allergic rhinitis   . Anxiety   . Arthritis   . Chronic cervical radiculopathy 02/02/2013  . Chronic low back pain 08/12/2010  . Depression   . Hearing loss    sensorineural  bilateral  . Hemorrhoids   . HOH (hard of hearing)   . Hyperlipidemia   . Hyperlipidemia   . Lumbago   . Psoriasis   . Retinal tear    bilateral no surgery  . Shingles    Past Surgical History:  Procedure Laterality Date  . CATARACT EXTRACTION Left    . detatched retina Left    laser surgery  . EYE SURGERY    . HEMORRHOID SURGERY     Family History  Problem Relation Age of Onset  . Cancer Father        lung  . Cancer Mother        bladder  . Diabetes Other    Social History   Socioeconomic History  . Marital status: Divorced    Spouse name: Not on file  . Number of children: 2  . Years of education: Not on file  . Highest education level: Not on file  Occupational History  . Occupation: disabled    Employer: RETIRED  Social Needs  . Financial resource strain: Somewhat hard  . Food insecurity    Worry: Sometimes true    Inability: Sometimes true  . Transportation needs    Medical: Yes    Non-medical: Yes  Tobacco Use  . Smoking status: Never Smoker  . Smokeless tobacco: Never Used  Substance and Sexual Activity  . Alcohol use: No  . Drug use: No  . Sexual activity: Never  Lifestyle  . Physical activity    Days per week: 0 days    Minutes per session: 0 min  . Stress: Rather much  Relationships  . Social connections    Talks on phone: More than three times a week    Gets together: More than three times a week  Attends religious service: Not on file    Active member of club or organization: Not on file    Attends meetings of clubs or organizations: Not on file    Relationship status: Divorced  Other Topics Concern  . Not on file  Social History Narrative   Originally from [place], [place]    Outpatient Encounter Medications as of 09/30/2018  Medication Sig  . ALPRAZolam (XANAX) 1 MG tablet Take 1 tablet (1 mg total) by mouth at bedtime as needed for anxiety.  . Calcitriol (VECTICAL) 3 MCG/GM cream Apply topically at bedtime.    . dimenhyDRINATE (DRAMAMINE) 50 MG tablet Take 25 mg by mouth daily.  Marland Kitchen ibuprofen (ADVIL,MOTRIN) 800 MG tablet TAKE 1 TABLET BY MOUTH EVERY 8 HOURS AS NEEDED  . Polyethyl Glycol-Propyl Glycol (SYSTANE OP) Apply to eye 3 (three) times daily.  . promethazine (PHENERGAN) 25 MG tablet  Take 1 tablet (25 mg total) by mouth every 6 (six) hours as needed for up to 7 days for nausea or vomiting.  Marland Kitchen tiZANidine (ZANAFLEX) 4 MG tablet Take 1 tablet (4 mg total) by mouth every 6 (six) hours as needed for muscle spasms.  . [DISCONTINUED] Lactulose 20 GM/30ML SOLN TAKE 30 MLS BY MOUTH TWICE DAILY AS NEEDED FOR CONSTIPATION   No facility-administered encounter medications on file as of 09/30/2018.     Activities of Daily Living No flowsheet data found.  Patient Care Team: Biagio Borg, MD as PCP - Nadara Eaton, MD as Consulting Physician (Orthopedic Surgery) Haverstock, Jennefer Bravo, MD as Referring Physician (Dermatology) Shelor Melburn Hake, Rex Kras, LCSW as Social Worker (Licensed Clinical Social Worker) Monna Fam, MD as Consulting Physician (Ophthalmology)   Assessment:   This is a routine wellness examination for Ricky Kelly. Physical assessment deferred to PCP.  Exercise Activities and Dietary recommendations   Diet (meal preparation, eat out, water intake, caffeinated beverages, dairy products, fruits and vegetables): in general, a "healthy" diet  , well balanced   Reviewed heart healthy diet. Encouraged patient to increase daily water and healthy fluid intake.  Goals    . Patient Stated     I want to take a vacation with my children. I will sit down and plan a vacation.       Fall Risk Fall Risk  03/25/2018 08/25/2017 03/20/2017 09/18/2016 03/19/2016  Falls in the past year? 0 No Yes No No  Number falls in past yr: - - 1 - -  Injury with Fall? - - No - -  Risk for fall due to : - - Impaired balance/gait;Impaired mobility - -   Is the patient's home free of loose throw rugs in walkways, pet beds, electrical cords, etc?   yes      Grab bars in the bathroom? yes      Handrails on the stairs?   yes      Adequate lighting?   yes   Depression Screen PHQ 2/9 Scores 03/25/2018 08/25/2017 03/20/2017 03/20/2017  PHQ - 2 Score 1 1 4 1   PHQ- 9 Score - - 12 -    Cognitive  Function       Ad8 score reviewed for issues:  Issues making decisions: no  Less interest in hobbies / activities: no  Repeats questions, stories (family complaining): no  Trouble using ordinary gadgets (microwave, computer, phone):no  Forgets the month or year: no  Mismanaging finances: no  Remembering appts: no  Daily problems with thinking and/or memory: no Ad8 score is= 0  Immunization History  Administered Date(s) Administered  . Influenza Whole 12/03/2007, 10/19/2008, 10/03/2009, 09/25/2010  . Influenza, High Dose Seasonal PF 10/07/2017  . Influenza,inj,Quad PF,6+ Mos 10/20/2012, 10/13/2013, 10/19/2014, 10/04/2015, 10/16/2016  . Pneumococcal Polysaccharide-23 02/15/2014  . Td 05/10/2008  . Zoster Recombinat (Shingrix) 07/21/2016   Screening Tests Health Maintenance  Topic Date Due  . COLONOSCOPY  09/05/2002  . PNA vac Low Risk Adult (1 of 2 - PCV13) 09/04/2017  . TETANUS/TDAP  05/11/2018  . INFLUENZA VACCINE  08/21/2018  . Hepatitis C Screening  Completed      Plan:    Reviewed health maintenance screenings with patient today and relevant education, vaccines, and/or referrals were provided.  Patient has a nurse appointment scheduled10/1/20 to receive flu and pneumonia vaccine. Declines colonoscopy referral and Cologuard at this time.  Continue to eat heart healthy diet (full of fruits, vegetables, whole grains, lean protein, water--limit salt, fat, and sugar intake) and increase physical activity as tolerated.   I have personally reviewed and noted the following in the patient's chart:   . Medical and social history . Use of alcohol, tobacco or illicit drugs  . Current medications and supplements . Functional ability and status . Nutritional status . Physical activity . Advanced directives . List of other physicians . Screenings to include cognitive, depression, and falls . Referrals and appointments  In addition, I have reviewed and discussed with  patient certain preventive protocols, quality metrics, and best practice recommendations. A written personalized care plan for preventive services as well as general preventive health recommendations were provided to patient.     Michiel Cowboy, RN  09/30/2018  Medical screening examination/treatment/procedure(s) were performed by non-physician practitioner and as supervising physician I was immediately available for consultation/collaboration. I agree with above. Cathlean Cower, MD

## 2018-09-30 NOTE — Progress Notes (Signed)
Patient ID: Ricky Kelly, male   DOB: [birth year], 68 y.o.   MRN: [id number]  Virtual Visit via Video Note  I connected with [name] on 09/30/18 at  1:20 PM EDT by a video enabled telemedicine application and verified that I am speaking with the correct person using two identifiers.  Location: Patient: at home Provider: at office   I discussed the limitations of evaluation and management by telemedicine and the availability of in person appointments. The patient expressed understanding and agreed to proceed.  History of Present Illness: Here to f/u; overall doing ok,  Pt denies chest pain, increasing sob or doe, wheezing, orthopnea, PND, increased LE swelling, palpitations, dizziness or syncope.  Pt denies new neurological symptoms such as new headache, or facial or extremity weakness or numbness.  Pt denies polydipsia, polyuria, or low sugar episode.  Pt states overall good compliance with meds, mostly trying to follow appropriate diet, with wt overall stable,  but little exercise however. Pt continues to have recurring LBP without change in severity, bowel or bladder change, fever, wt loss,  worsening LE pain/numbness/weakness, gait change or falls. Plans to have flu shot, pneumovax Tdap soon. Denies worsening depressive symptoms, suicidal ideation, or panic Past Medical History:  Diagnosis Date  . Adjustment disorder 1/10   with depressive symptoms  . Allergic rhinitis   . Anxiety   . Arthritis   . Chronic cervical radiculopathy 02/02/2013  . Chronic low back pain 08/12/2010  . Depression   . Hearing loss    sensorineural  bilateral  . Hemorrhoids   . HOH (hard of hearing)   . Hyperlipidemia   . Hyperlipidemia   . Lumbago   . Psoriasis   . Retinal tear    bilateral no surgery  . Shingles    Past Surgical History:  Procedure Laterality Date  . CATARACT EXTRACTION Left   . detatched retina Left    laser surgery  . EYE SURGERY    . HEMORRHOID SURGERY       reports that he has never smoked. He has never used smokeless tobacco. He reports that he does not drink alcohol or use drugs. family history includes Cancer in his father and mother; Diabetes in an other family member. Allergies  Allergen Reactions  . Allegra [Fexofenadine Hcl] Other (See Comments)    constipation  . Escitalopram Oxalate     constipation  . Naproxen     constipation  . Statins Other (See Comments)    constipation   Current Outpatient Medications on File Prior to Visit  Medication Sig Dispense Refill  . Calcitriol (VECTICAL) 3 MCG/GM cream Apply topically at bedtime.      . dimenhyDRINATE (DRAMAMINE) 50 MG tablet Take 25 mg by mouth daily.    [name] Glycol-Propyl Glycol (SYSTANE OP) Apply to eye 3 (three) times daily.    . promethazine (PHENERGAN) 25 MG tablet Take 1 tablet (25 mg total) by mouth every 6 (six) hours as needed for up to 7 days for nausea or vomiting. 30 tablet 2  . tiZANidine (ZANAFLEX) 4 MG tablet Take 1 tablet (4 mg total) by mouth every 6 (six) hours as needed for muscle spasms. 60 tablet 1   No current facility-administered medications on file prior to visit.     Observations/Objective: Alert, NAD, appropriate mood and affect, resps normal, cn 2-12 intact, moves all 4s, no visible rash or swelling Lab Results  Component Value Date   WBC 7.1 09/23/2017   HGB 14.7  09/23/2017   HCT 42.9 09/23/2017   PLT 169.0 09/23/2017   GLUCOSE 101 (H) 09/23/2017   CHOL 190 09/23/2017   TRIG 134.0 09/23/2017   HDL 42.40 09/23/2017   LDLDIRECT 100.0 09/18/2016   LDLCALC 121 (H) 09/23/2017   ALT 18 09/23/2017   AST 20 09/23/2017   NA 135 09/23/2017   K 4.2 09/23/2017   CL 99 09/23/2017   CREATININE 1.00 09/23/2017   BUN 9 09/23/2017   CO2 30 09/23/2017   TSH 1.75 09/23/2017   PSA 1.09 09/23/2017   HGBA1C 5.5 09/23/2017   Assessment and Plan: See notes  Declines labs today, will likely be able next visit  Follow Up Instructions: See notes    I discussed the assessment and treatment plan with the patient. The patient was provided an opportunity to ask questions and all were answered. The patient agreed with the plan and demonstrated an understanding of the instructions.   The patient was advised to call back or seek an in-person evaluation if the symptoms worsen or if the condition fails to improve as anticipated.   Cathlean Cower, MD

## 2018-09-30 NOTE — Assessment & Plan Note (Signed)
stable overall by history and exam, recent data reviewed with pt, and pt to continue medical treatment as before,  to f/u any worsening symptoms or concerns, for lipids with next labs 

## 2018-10-06 ENCOUNTER — Ambulatory Visit (INDEPENDENT_AMBULATORY_CARE_PROVIDER_SITE_OTHER): Payer: Medicare Other | Admitting: Psychology

## 2018-10-06 DIAGNOSIS — F330 Major depressive disorder, recurrent, mild: Secondary | ICD-10-CM

## 2018-10-21 ENCOUNTER — Ambulatory Visit (INDEPENDENT_AMBULATORY_CARE_PROVIDER_SITE_OTHER): Payer: Medicare Other

## 2018-10-21 DIAGNOSIS — Z23 Encounter for immunization: Secondary | ICD-10-CM | POA: Diagnosis not present

## 2018-10-21 DIAGNOSIS — Z299 Encounter for prophylactic measures, unspecified: Secondary | ICD-10-CM

## 2018-11-03 ENCOUNTER — Ambulatory Visit (INDEPENDENT_AMBULATORY_CARE_PROVIDER_SITE_OTHER): Payer: Medicare Other | Admitting: Psychology

## 2018-11-03 DIAGNOSIS — F330 Major depressive disorder, recurrent, mild: Secondary | ICD-10-CM

## 2018-11-17 ENCOUNTER — Other Ambulatory Visit: Payer: Self-pay

## 2018-11-17 ENCOUNTER — Ambulatory Visit (INDEPENDENT_AMBULATORY_CARE_PROVIDER_SITE_OTHER): Payer: Medicare Other

## 2018-11-17 DIAGNOSIS — Z23 Encounter for immunization: Secondary | ICD-10-CM | POA: Diagnosis not present

## 2018-11-17 DIAGNOSIS — Z299 Encounter for prophylactic measures, unspecified: Secondary | ICD-10-CM

## 2018-12-01 DIAGNOSIS — H35373 Puckering of macula, bilateral: Secondary | ICD-10-CM | POA: Diagnosis not present

## 2018-12-01 DIAGNOSIS — H33191 Other retinoschisis and retinal cysts, right eye: Secondary | ICD-10-CM | POA: Diagnosis not present

## 2018-12-01 DIAGNOSIS — H31092 Other chorioretinal scars, left eye: Secondary | ICD-10-CM | POA: Diagnosis not present

## 2018-12-01 DIAGNOSIS — H3581 Retinal edema: Secondary | ICD-10-CM | POA: Diagnosis not present

## 2018-12-20 ENCOUNTER — Ambulatory Visit: Payer: Medicare Other | Admitting: Psychology

## 2019-01-12 ENCOUNTER — Telehealth: Payer: Self-pay | Admitting: Internal Medicine

## 2019-01-12 NOTE — Telephone Encounter (Signed)
Patient dropped off a renewal parking placard.   Form has been completed &Placed in providers box to review and sign.  

## 2019-01-18 NOTE — Telephone Encounter (Signed)
Form has been signed, copy sent to scan.   Patient has been informed and original is ready to be picked up.

## 2019-01-27 ENCOUNTER — Ambulatory Visit (INDEPENDENT_AMBULATORY_CARE_PROVIDER_SITE_OTHER): Payer: Medicare Other | Admitting: Psychology

## 2019-01-27 DIAGNOSIS — F330 Major depressive disorder, recurrent, mild: Secondary | ICD-10-CM

## 2019-02-24 ENCOUNTER — Ambulatory Visit: Payer: Medicare Other | Admitting: Psychology

## 2019-04-14 ENCOUNTER — Ambulatory Visit (INDEPENDENT_AMBULATORY_CARE_PROVIDER_SITE_OTHER): Payer: Medicare Other | Admitting: Psychology

## 2019-04-14 DIAGNOSIS — F330 Major depressive disorder, recurrent, mild: Secondary | ICD-10-CM

## 2019-04-28 ENCOUNTER — Other Ambulatory Visit: Payer: Self-pay | Admitting: Internal Medicine

## 2019-04-28 DIAGNOSIS — H04123 Dry eye syndrome of bilateral lacrimal glands: Secondary | ICD-10-CM | POA: Diagnosis not present

## 2019-04-28 DIAGNOSIS — H40013 Open angle with borderline findings, low risk, bilateral: Secondary | ICD-10-CM | POA: Diagnosis not present

## 2019-04-28 NOTE — Telephone Encounter (Signed)
Done erx 

## 2019-05-10 ENCOUNTER — Ambulatory Visit (INDEPENDENT_AMBULATORY_CARE_PROVIDER_SITE_OTHER): Payer: Medicare Other | Admitting: Psychology

## 2019-05-10 DIAGNOSIS — F330 Major depressive disorder, recurrent, mild: Secondary | ICD-10-CM | POA: Diagnosis not present

## 2019-06-15 ENCOUNTER — Ambulatory Visit (INDEPENDENT_AMBULATORY_CARE_PROVIDER_SITE_OTHER): Payer: Medicare Other | Admitting: Psychology

## 2019-06-15 DIAGNOSIS — F330 Major depressive disorder, recurrent, mild: Secondary | ICD-10-CM | POA: Diagnosis not present

## 2019-06-24 ENCOUNTER — Ambulatory Visit: Payer: Medicare Other | Admitting: Podiatry

## 2019-08-01 ENCOUNTER — Ambulatory Visit (INDEPENDENT_AMBULATORY_CARE_PROVIDER_SITE_OTHER): Payer: Medicare Other | Admitting: Psychology

## 2019-08-01 DIAGNOSIS — F330 Major depressive disorder, recurrent, mild: Secondary | ICD-10-CM | POA: Diagnosis not present

## 2019-09-09 ENCOUNTER — Telehealth: Payer: Self-pay | Admitting: Internal Medicine

## 2019-09-09 NOTE — Telephone Encounter (Signed)
Patient has been summoned for jury duty on 11/02/2019 and he was wondering if Dr. Jenny Reichmann could write him a letter stating that he could not attend because of his diabilities.

## 2019-09-09 NOTE — Telephone Encounter (Signed)
Pt is requesting labs before his 10/20/2019 appointment for his physical. He said that he would go to the lab at [facility].

## 2019-09-09 NOTE — Telephone Encounter (Signed)
Sent to Dr. John. 

## 2019-09-09 NOTE — Telephone Encounter (Signed)
Very sorry, medicare will not pay for lab work to be done prior to visit

## 2019-09-09 NOTE — Telephone Encounter (Signed)
Ok for letter due to chronic low back pain - can Ricky Frederickson do this, thanks

## 2019-09-09 NOTE — Telephone Encounter (Signed)
Sent to Dr. John to advise. 

## 2019-09-16 NOTE — Telephone Encounter (Signed)
[place] for letter, though it seems against the HIPAA law to do so.  Also, there are normally no repercussions at all if he simply does not show as they are not good at enforcement, but we can do the letter nevertheless

## 2019-09-16 NOTE — Telephone Encounter (Signed)
Spoke with pt and informed pt of Dr. Gwynn Burly note.

## 2019-09-21 ENCOUNTER — Encounter: Payer: Self-pay | Admitting: Internal Medicine

## 2019-09-21 ENCOUNTER — Ambulatory Visit (INDEPENDENT_AMBULATORY_CARE_PROVIDER_SITE_OTHER): Payer: Medicare Other | Admitting: Psychology

## 2019-09-21 DIAGNOSIS — F330 Major depressive disorder, recurrent, mild: Secondary | ICD-10-CM | POA: Diagnosis not present

## 2019-09-21 NOTE — Telephone Encounter (Signed)
    Patient returned call, his son will pick up letter

## 2019-09-21 NOTE — Telephone Encounter (Signed)
Letter has been printed and signed nd is ready for pt to pick up from the front desk.  ** Tried calling pt on both his home phone which I LDVM for pt along with the new address for the clinic. I was not able to lvm on pts cell phone as it has stated the vm box is full at this time.

## 2019-10-19 ENCOUNTER — Ambulatory Visit (INDEPENDENT_AMBULATORY_CARE_PROVIDER_SITE_OTHER): Payer: Medicare Other | Admitting: Psychology

## 2019-10-19 DIAGNOSIS — F330 Major depressive disorder, recurrent, mild: Secondary | ICD-10-CM | POA: Diagnosis not present

## 2019-10-20 ENCOUNTER — Other Ambulatory Visit: Payer: Self-pay

## 2019-10-20 ENCOUNTER — Ambulatory Visit (INDEPENDENT_AMBULATORY_CARE_PROVIDER_SITE_OTHER): Payer: Medicare Other | Admitting: Internal Medicine

## 2019-10-20 ENCOUNTER — Encounter: Payer: Self-pay | Admitting: Internal Medicine

## 2019-10-20 VITALS — BP 130/82 | HR 76 | Temp 98.5°F | Ht 66.0 in | Wt 168.0 lb

## 2019-10-20 DIAGNOSIS — E559 Vitamin D deficiency, unspecified: Secondary | ICD-10-CM

## 2019-10-20 DIAGNOSIS — Z23 Encounter for immunization: Secondary | ICD-10-CM

## 2019-10-20 DIAGNOSIS — E538 Deficiency of other specified B group vitamins: Secondary | ICD-10-CM | POA: Diagnosis not present

## 2019-10-20 DIAGNOSIS — R739 Hyperglycemia, unspecified: Secondary | ICD-10-CM | POA: Diagnosis not present

## 2019-10-20 DIAGNOSIS — N320 Bladder-neck obstruction: Secondary | ICD-10-CM | POA: Diagnosis not present

## 2019-10-20 DIAGNOSIS — F32A Depression, unspecified: Secondary | ICD-10-CM

## 2019-10-20 DIAGNOSIS — E785 Hyperlipidemia, unspecified: Secondary | ICD-10-CM | POA: Diagnosis not present

## 2019-10-20 DIAGNOSIS — G894 Chronic pain syndrome: Secondary | ICD-10-CM | POA: Diagnosis not present

## 2019-10-20 DIAGNOSIS — F329 Major depressive disorder, single episode, unspecified: Secondary | ICD-10-CM | POA: Diagnosis not present

## 2019-10-20 MED ORDER — TIZANIDINE HCL 4 MG PO TABS: 4.0000 mg | ORAL_TABLET | Freq: Four times a day (QID) | ORAL | 4 refills | Status: DC | PRN

## 2019-10-20 MED ORDER — IBUPROFEN 800 MG PO TABS: 800.0000 mg | ORAL_TABLET | Freq: Three times a day (TID) | ORAL | 5 refills | Status: DC | PRN

## 2019-10-20 MED ORDER — ALPRAZOLAM 1 MG PO TABS: ORAL_TABLET | ORAL | 1 refills | Status: DC

## 2019-10-20 NOTE — Patient Instructions (Addendum)
You had the flu shot today  Please continue all other medications as before, and refills have been done if requested.  Please have the pharmacy call with any other refills you may need.  Please continue your efforts at being more active, low cholesterol diet, and weight control.  You are otherwise up to date with prevention measures today.  Please keep your appointments with your specialists as you may have planned  Please go to the LAB at the blood drawing area for the tests to be done  You will be contacted by phone if any changes need to be made immediately.  Otherwise, you will receive a letter about your results with an explanation, but please check with MyChart first.  Please remember to sign up for MyChart if you have not done so, as this will be important to you in the future with finding out test results, communicating by private email, and scheduling acute appointments online when needed.  Please make an Appointment to return for your 1 year visit, or sooner if needed 

## 2019-10-20 NOTE — Progress Notes (Signed)
Subjective:    Patient ID: Ricky Kelly, male    DOB: 21-Aug-1950, 68 y.o.   MRN: [id number]  HPI  Here to f/u; overall doing ok,  Pt denies chest pain, increasing sob or doe, wheezing, orthopnea, PND, increased LE swelling, palpitations, dizziness or syncope.  Pt denies new neurological symptoms such as new headache, or facial or extremity weakness or numbness.  Pt denies polydipsia, polyuria, or low sugar episode.  Pt states overall good compliance with meds, mostly trying to follow appropriate diet, with wt overall stable,  but little exercise however.  Due for pneumovax.  Pt continues to have recurring LBP without change in severity, bowel or bladder change, fever, wt loss,  worsening LE pain/numbness/weakness, gait change or falls.  Denies worsening depressive symptoms, suicidal ideation, or panic; due for  Flu shot Past Medical History:  Diagnosis Date   Adjustment disorder 1/10   with depressive symptoms   Allergic rhinitis    Anxiety    Arthritis    Chronic cervical radiculopathy 02/02/2013   Chronic low back pain 08/12/2010   Depression    Hearing loss    sensorineural  bilateral   Hemorrhoids    HOH (hard of hearing)    Hyperlipidemia    Hyperlipidemia    Lumbago    Psoriasis    Retinal tear    bilateral no surgery   Shingles    Past Surgical History:  Procedure Laterality Date   CATARACT EXTRACTION Left    detatched retina Left    laser surgery   EYE SURGERY     HEMORRHOID SURGERY      reports that he has never smoked. He has never used smokeless tobacco. He reports that he does not drink alcohol and does not use drugs. family history includes Cancer in his father and mother; Diabetes in an other family member. Allergies  Allergen Reactions   Allegra [Fexofenadine Hcl] Other (See Comments)    constipation   Escitalopram Oxalate     constipation   Naproxen     constipation   Statins Other (See Comments)    constipation   Current  Outpatient Medications on File Prior to Visit  Medication Sig Dispense Refill   Calcitriol (VECTICAL) 3 MCG/GM cream Apply topically at bedtime.       carboxymethylcellulose (REFRESH PLUS) 0.5 % SOLN 1 drop daily as needed.     dimenhyDRINATE (DRAMAMINE) 50 MG tablet Take 25 mg by mouth daily.     Polyethyl Glycol-Propyl Glycol (SYSTANE OP) Apply to eye 3 (three) times daily.     promethazine (PHENERGAN) 25 MG tablet Take 1 tablet (25 mg total) by mouth every 6 (six) hours as needed for up to 7 days for nausea or vomiting. 30 tablet 2   No current facility-administered medications on file prior to visit.   Review of Systems All otherwise neg per pt     Objective:   Physical Exam BP 130/82 (BP Location: Left Arm, Patient Position: Sitting, Cuff Size: Large)    Pulse 76    Temp 98.5 F (36.9 C) (Oral)    Ht 5\' 6"  (1.676 m)    Wt 168 lb (76.2 kg)    SpO2 98%    BMI 27.12 kg/m  VS noted,  Constitutional: Pt appears in NAD HENT: Head: NCAT.  Right Ear: External ear normal.  Left Ear: External ear normal.  Eyes: . Pupils are equal, round, and reactive to light. Conjunctivae and EOM are normal Nose: without d/c or  deformity Neck: Neck supple. Gross normal ROM Cardiovascular: Normal rate and regular rhythm.   Pulmonary/Chest: Effort normal and breath sounds without rales or wheezing.  Abd:  Soft, NT, ND, + BS, no organomegaly Neurological: Pt is alert. At baseline orientation, motor grossly intact Skin: Skin is warm. No rashes, other new lesions, no LE edema Psychiatric: Pt behavior is normal without agitation  All otherwise neg per pt Lab Results  Component Value Date   WBC 7.9 10/20/2019   HGB 14.6 10/20/2019   HCT 43.2 10/20/2019   PLT 199 10/20/2019   GLUCOSE 83 10/20/2019   CHOL 210 (H) 10/20/2019   TRIG 150 (H) 10/20/2019   HDL 51 10/20/2019   LDLDIRECT 100.0 09/18/2016   LDLCALC 133 (H) 10/20/2019   ALT 20 10/20/2019   AST 21 10/20/2019   NA 136 10/20/2019   K 4.5  10/20/2019   CL 98 10/20/2019   CREATININE 0.90 10/20/2019   BUN 10 10/20/2019   CO2 29 10/20/2019   TSH 1.98 10/20/2019   PSA 0.79 10/20/2019   HGBA1C 5.5 10/20/2019      Assessment & Plan:

## 2019-10-21 ENCOUNTER — Other Ambulatory Visit: Payer: Self-pay | Admitting: Internal Medicine

## 2019-10-21 ENCOUNTER — Encounter: Payer: Self-pay | Admitting: Internal Medicine

## 2019-10-21 LAB — COMPLETE METABOLIC PANEL WITHOUT GFR
AG Ratio: 1.5 (calc) (ref 1.0–2.5)
ALT: 20 U/L (ref 9–46)
AST: 21 U/L (ref 10–35)
Albumin: 4.6 g/dL (ref 3.6–5.1)
Alkaline phosphatase (APISO): 47 U/L (ref 35–144)
BUN: 10 mg/dL (ref 7–25)
CO2: 29 mmol/L (ref 20–32)
Calcium: 9.7 mg/dL (ref 8.6–10.3)
Chloride: 98 mmol/L (ref 98–110)
Creat: 0.9 mg/dL (ref 0.70–1.25)
GFR, Est African American: 102 mL/min/1.73m2
GFR, Est Non African American: 88 mL/min/1.73m2
Globulin: 3 g/dL (ref 1.9–3.7)
Glucose, Bld: 83 mg/dL (ref 65–99)
Potassium: 4.5 mmol/L (ref 3.5–5.3)
Sodium: 136 mmol/L (ref 135–146)
Total Bilirubin: 0.5 mg/dL (ref 0.2–1.2)
Total Protein: 7.6 g/dL (ref 6.1–8.1)

## 2019-10-21 LAB — VITAMIN B12: Vitamin B-12: 602 pg/mL (ref 200–1100)

## 2019-10-21 LAB — URINALYSIS, ROUTINE W REFLEX MICROSCOPIC
Bilirubin Urine: NEGATIVE
Glucose, UA: NEGATIVE
Hgb urine dipstick: NEGATIVE
Ketones, ur: NEGATIVE
Leukocytes,Ua: NEGATIVE
Nitrite: NEGATIVE
Protein, ur: NEGATIVE
Specific Gravity, Urine: 1.005 (ref 1.001–1.03)
pH: 6.5 (ref 5.0–8.0)

## 2019-10-21 LAB — VITAMIN D 25 HYDROXY (VIT D DEFICIENCY, FRACTURES): Vit D, 25-Hydroxy: 16 ng/mL — ABNORMAL LOW (ref 30–100)

## 2019-10-21 LAB — LIPID PANEL
Cholesterol: 210 mg/dL — ABNORMAL HIGH (ref <200)
HDL: 51 mg/dL (ref >=40)
LDL Cholesterol (Calc): 133 mg/dL (calc) — ABNORMAL HIGH
Non-HDL Cholesterol (Calc): 159 mg/dL (calc) — ABNORMAL HIGH (ref <130)
Total CHOL/HDL Ratio: 4.1 (calc) (ref <5.0)
Triglycerides: 150 mg/dL — ABNORMAL HIGH (ref <150)

## 2019-10-21 LAB — CBC WITH DIFFERENTIAL/PLATELET
Absolute Monocytes: 877 cells/uL (ref 200–950)
Basophils Absolute: 47 cells/uL (ref 0–200)
Basophils Relative: 0.6 %
Eosinophils Absolute: 150 cells/uL (ref 15–500)
Eosinophils Relative: 1.9 %
HCT: 43.2 % (ref 38.5–50.0)
Hemoglobin: 14.6 g/dL (ref 13.2–17.1)
Lymphs Abs: 3089 cells/uL (ref 850–3900)
MCH: 29.9 pg (ref 27.0–33.0)
MCHC: 33.8 g/dL (ref 32.0–36.0)
MCV: 88.5 fL (ref 80.0–100.0)
MPV: 10.8 fL (ref 7.5–12.5)
Monocytes Relative: 11.1 %
Neutro Abs: 3737 cells/uL (ref 1500–7800)
Neutrophils Relative %: 47.3 %
Platelets: 199 10*3/uL (ref 140–400)
RBC: 4.88 10*6/uL (ref 4.20–5.80)
RDW: 12.9 % (ref 11.0–15.0)
Total Lymphocyte: 39.1 %
WBC: 7.9 10*3/uL (ref 3.8–10.8)

## 2019-10-21 LAB — HEMOGLOBIN A1C
Hgb A1c MFr Bld: 5.5 % of total Hgb (ref <5.7)
Mean Plasma Glucose: 111 (calc)
eAG (mmol/L): 6.2 (calc)

## 2019-10-21 LAB — TSH: TSH: 1.98 m[IU]/L (ref 0.40–4.50)

## 2019-10-21 LAB — PSA: PSA: 0.79 ng/mL (ref <=4.0)

## 2019-10-21 MED ORDER — VITAMIN D (ERGOCALCIFEROL) 1.25 MG (50000 UNIT) PO CAPS: 50000.0000 [IU] | ORAL_CAPSULE | ORAL | 0 refills | Status: DC

## 2019-10-23 ENCOUNTER — Encounter: Payer: Self-pay | Admitting: Internal Medicine

## 2019-10-23 NOTE — Assessment & Plan Note (Signed)
stable overall by history and exam, recent data reviewed with pt, and pt to continue medical treatment as before,  to f/u any worsening symptoms or concerns  

## 2019-10-23 NOTE — Assessment & Plan Note (Addendum)
stable overall by history and exam, recent data reviewed with pt, and pt to continue medical treatment as before,  to f/u any worsening symptoms or concerns  I spent 31 minutes in preparing to see the patient by review of recent labs, imaging and procedures, obtaining and reviewing separately obtained history, communicating with the patient and family or caregiver, ordering medications, tests or procedures, and documenting clinical information in the EHR including the differential Dx, treatment, and any further evaluation and other management of hyperglycemia, depression, chronic pain, hld

## 2019-11-03 ENCOUNTER — Telehealth: Payer: Self-pay | Admitting: Internal Medicine

## 2019-11-03 NOTE — Telephone Encounter (Signed)
Patient calling requesting someone give a call back to discuss blood work results from last visit on 10/20/2019.  Patients # [id number] patient also stated it would be okay to leave a voicemail

## 2019-11-09 NOTE — Telephone Encounter (Signed)
Patient called back to follow up. He wanted to know why he not received his print out of labs yet. Informed patient they had been mailed on 10/1, But I will resend mail.

## 2019-11-16 ENCOUNTER — Ambulatory Visit (INDEPENDENT_AMBULATORY_CARE_PROVIDER_SITE_OTHER): Payer: Medicare Other | Admitting: Psychology

## 2019-11-16 DIAGNOSIS — F330 Major depressive disorder, recurrent, mild: Secondary | ICD-10-CM

## 2019-11-22 DIAGNOSIS — Z23 Encounter for immunization: Secondary | ICD-10-CM | POA: Diagnosis not present

## 2019-11-28 DIAGNOSIS — H40013 Open angle with borderline findings, low risk, bilateral: Secondary | ICD-10-CM | POA: Diagnosis not present

## 2019-11-28 DIAGNOSIS — H04123 Dry eye syndrome of bilateral lacrimal glands: Secondary | ICD-10-CM | POA: Diagnosis not present

## 2019-11-28 DIAGNOSIS — H2511 Age-related nuclear cataract, right eye: Secondary | ICD-10-CM | POA: Diagnosis not present

## 2019-11-28 DIAGNOSIS — H35371 Puckering of macula, right eye: Secondary | ICD-10-CM | POA: Diagnosis not present

## 2019-12-14 ENCOUNTER — Ambulatory Visit: Payer: Medicare Other | Admitting: Psychology

## 2019-12-29 ENCOUNTER — Ambulatory Visit (INDEPENDENT_AMBULATORY_CARE_PROVIDER_SITE_OTHER): Payer: Medicare Other | Admitting: Psychology

## 2019-12-29 DIAGNOSIS — F330 Major depressive disorder, recurrent, mild: Secondary | ICD-10-CM | POA: Diagnosis not present

## 2020-01-17 DIAGNOSIS — H35371 Puckering of macula, right eye: Secondary | ICD-10-CM | POA: Diagnosis not present

## 2020-01-17 DIAGNOSIS — H31092 Other chorioretinal scars, left eye: Secondary | ICD-10-CM | POA: Diagnosis not present

## 2020-01-17 DIAGNOSIS — H3581 Retinal edema: Secondary | ICD-10-CM | POA: Diagnosis not present

## 2020-01-17 DIAGNOSIS — H33191 Other retinoschisis and retinal cysts, right eye: Secondary | ICD-10-CM | POA: Diagnosis not present

## 2020-02-01 ENCOUNTER — Ambulatory Visit (INDEPENDENT_AMBULATORY_CARE_PROVIDER_SITE_OTHER): Payer: Medicare Other | Admitting: Psychology

## 2020-02-01 ENCOUNTER — Other Ambulatory Visit: Payer: Self-pay | Admitting: Internal Medicine

## 2020-02-01 DIAGNOSIS — F330 Major depressive disorder, recurrent, mild: Secondary | ICD-10-CM | POA: Diagnosis not present

## 2020-02-29 ENCOUNTER — Ambulatory Visit (INDEPENDENT_AMBULATORY_CARE_PROVIDER_SITE_OTHER): Payer: Medicare Other | Admitting: Psychology

## 2020-02-29 DIAGNOSIS — F330 Major depressive disorder, recurrent, mild: Secondary | ICD-10-CM

## 2020-03-09 ENCOUNTER — Other Ambulatory Visit: Payer: Self-pay

## 2020-03-09 ENCOUNTER — Ambulatory Visit (INDEPENDENT_AMBULATORY_CARE_PROVIDER_SITE_OTHER): Payer: Medicare Other | Admitting: Internal Medicine

## 2020-03-09 ENCOUNTER — Encounter: Payer: Self-pay | Admitting: Internal Medicine

## 2020-03-09 VITALS — BP 144/82 | HR 82 | Temp 97.9°F | Ht 66.0 in | Wt 177.0 lb

## 2020-03-09 DIAGNOSIS — H903 Sensorineural hearing loss, bilateral: Secondary | ICD-10-CM

## 2020-03-09 DIAGNOSIS — R21 Rash and other nonspecific skin eruption: Secondary | ICD-10-CM

## 2020-03-09 DIAGNOSIS — G8929 Other chronic pain: Secondary | ICD-10-CM

## 2020-03-09 DIAGNOSIS — L84 Corns and callosities: Secondary | ICD-10-CM | POA: Diagnosis not present

## 2020-03-09 DIAGNOSIS — M545 Low back pain, unspecified: Secondary | ICD-10-CM

## 2020-03-09 DIAGNOSIS — R030 Elevated blood-pressure reading, without diagnosis of hypertension: Secondary | ICD-10-CM | POA: Diagnosis not present

## 2020-03-09 MED ORDER — TRIAMCINOLONE ACETONIDE 0.1 % EX CREA: 1.0000 "application " | TOPICAL_CREAM | Freq: Two times a day (BID) | CUTANEOUS | 0 refills | Status: AC

## 2020-03-09 NOTE — Progress Notes (Signed)
Patient ID: Ricky Kelly, male   DOB: [birth year], 70 y.o.   MRN: [id number]        Chief Complaint: rash to right arm       HPI:  Ricky Kelly is a 70 y.o. male here with c/o itchy rash to right arm with multiple red lesions located only x 3 days to the medial upper and lower arm, but no known contact irritants. Overall mild, constant, nothing seems to make better or worse. Has hx of psoriasis but does not seem like those typical psoriatic lesions.  No other rash elsewhere or itching or swelling.  No recent med changes.   Also c/o bilateral heel pain, dull but occasionaly sharp, mild but worse to walk, better to sit and take wt off, cant see his heels on his own but does not think has any wounds or sores. No trauma.  Has a podiatrist but not seen in over a year.  BP at home has been < 140/90 but not checked recently.  Pt denies chest pain, increased sob or doe, wheezing, orthopnea, PND, increased LE swelling, palpitations, dizziness or syncope.   Pt denies polydipsia, polyuria.  No focal neuro s/s. Pt continues to have recurring LBP without change in severity, bowel or bladder change, fever, wt loss,  worsening LE pain/numbness/weakness, gait change or falls.  Hearing loss chronic severe no change Wt Readings from Last 3 Encounters:  03/09/20 177 lb (80.3 kg)  10/20/19 168 lb (76.2 kg)  03/25/18 166 lb (75.3 kg)   BP Readings from Last 3 Encounters:  03/09/20 (!) 144/82  10/20/19 130/82  03/25/18 120/74         Past Medical History:  Diagnosis Date  . Adjustment disorder 1/10   with depressive symptoms  . Allergic rhinitis   . Anxiety   . Arthritis   . Chronic cervical radiculopathy 02/02/2013  . Chronic low back pain 08/12/2010  . Depression   . Hearing loss    sensorineural  bilateral  . Hemorrhoids   . HOH (hard of hearing)   . Hyperlipidemia   . Hyperlipidemia   . Lumbago   . Psoriasis   . Retinal tear    bilateral no surgery  . Shingles    Past Surgical History:   Procedure Laterality Date  . CATARACT EXTRACTION Left   . detatched retina Left    laser surgery  . EYE SURGERY    . HEMORRHOID SURGERY      reports that he has never smoked. He has never used smokeless tobacco. He reports that he does not drink alcohol and does not use drugs. family history includes Cancer in his father and mother; Diabetes in an other family member. Allergies  Allergen Reactions  . Allegra [Fexofenadine Hcl] Other (See Comments)    constipation  . Escitalopram Oxalate     constipation  . Naproxen     constipation  . Statins Other (See Comments)    constipation   Current Outpatient Medications on File Prior to Visit  Medication Sig Dispense Refill  . acetaminophen (TYLENOL) 500 MG tablet Take 500 mg by mouth every 6 (six) hours as needed.    . ALPRAZolam (XANAX) 1 MG tablet TAKE 1 TABLET BY MOUTH AT BEDTIME AS NEEDED FOR ANXIETY 90 tablet 1  . carboxymethylcellulose (REFRESH PLUS) 0.5 % SOLN 1 drop daily as needed.    . dimenhyDRINATE (DRAMAMINE) 50 MG tablet Take 25 mg by mouth daily.    Marland Kitchen ibuprofen (ADVIL) 800 MG  tablet Take 1 tablet (800 mg total) by mouth every 8 (eight) hours as needed. 60 tablet 5  . Polyethyl Glycol-Propyl Glycol (SYSTANE OP) Apply to eye 3 (three) times daily.    . promethazine (PHENERGAN) 25 MG tablet TAKE 1 TABLET BY MOUTH EVERY 6 HOURS AS NEEDED FOR NAUSEA AND VOMITING 30 tablet 0  . tiZANidine (ZANAFLEX) 4 MG tablet Take 1 tablet (4 mg total) by mouth every 6 (six) hours as needed for muscle spasms. 60 tablet 4  . Calcitriol 3 MCG/GM cream Apply topically at bedtime.   (Patient not taking: Reported on 03/09/2020)    . Vitamin D, Ergocalciferol, (DRISDOL) 1.25 MG (50000 UNIT) CAPS capsule Take 1 capsule (50,000 Units total) by mouth every 7 (seven) days. (Patient not taking: Reported on 03/09/2020) 12 capsule 0   No current facility-administered medications on file prior to visit.        ROS:  All others reviewed and  negative.  Objective        PE:  BP (!) 144/82   Pulse 82   Temp 97.9 F (36.6 C) (Oral)   Ht 5\' 6"  (1.676 m)   Wt 177 lb (80.3 kg)   SpO2 99%   BMI 28.57 kg/m                 Constitutional: Pt appears in NAD               HENT: Head: NCAT.                Right Ear: External ear normal.                 Left Ear: External ear normal.                Eyes: . Pupils are equal, round, and reactive to light. Conjunctivae and EOM are normal               Nose: without d/c or deformity               Neck: Neck supple. Gross normal ROM               Cardiovascular: Normal rate and regular rhythm.                 Pulmonary/Chest: Effort normal and breath sounds without rales or wheezing.                Abd:  Soft, NT, ND, + BS, no organomegaly               Neurological: Pt is alert. At baseline orientation, motor grossly intact               Skin: Skin is warm. LE edema - none, bilateral heels intact but tender with callous; Several mild non raised erythem lesion pruritic to upper and lower medial right arm               Psychiatric: Pt behavior is normal without agitation   Micro: none  Cardiac tracings I have personally interpreted today:  none  Pertinent Radiological findings (summarize): none   Lab Results  Component Value Date   WBC 7.9 10/20/2019   HGB 14.6 10/20/2019   HCT 43.2 10/20/2019   PLT 199 10/20/2019   GLUCOSE 83 10/20/2019   CHOL 210 (H) 10/20/2019   TRIG 150 (H) 10/20/2019   HDL 51 10/20/2019   LDLDIRECT 100.0 09/18/2016   LDLCALC 133 (H) 10/20/2019  ALT 20 10/20/2019   AST 21 10/20/2019   NA 136 10/20/2019   K 4.5 10/20/2019   CL 98 10/20/2019   CREATININE 0.90 10/20/2019   BUN 10 10/20/2019   CO2 29 10/20/2019   TSH 1.98 10/20/2019   PSA 0.79 10/20/2019   HGBA1C 5.5 10/20/2019   Assessment/Plan:  Ricky Kelly is a 70 y.o. White or Caucasian [1] male with  has a past medical history of Adjustment disorder (1/10), Allergic rhinitis,  Anxiety, Arthritis, Chronic cervical radiculopathy (02/02/2013), Chronic low back pain (08/12/2010), Depression, Hearing loss, Hemorrhoids, HOH (hard of hearing), Hyperlipidemia, Hyperlipidemia, Lumbago, Psoriasis, Retinal tear, and Shingles.  Pre-ulcerative corn or callous With pain bilateral, no ulcers, encouraged pt to f/u with podiatry for callous management  Hearing loss sensory, bilateral Stable, cont hearing aids, delcines f/u audiology  Chronic low back pain Chronic stable, cont current pain tx  Rash Several mild non raised erythem lesion pruritic to upper and lower medial right arm - ? Contact vs psoriatic vs other - for triam cr prn, consider derm f/u  Blood pressure elevated without history of HTN Pt to f/u bp at home to ensure good control < 140/90 and call in 10 days with average  Followup: Return if symptoms worsen or fail to improve.  Cathlean Cower, MD 03/09/2020 8:54 PM [facility]

## 2020-03-09 NOTE — Assessment & Plan Note (Signed)
Several mild non raised erythem lesion pruritic to upper and lower medial right arm - ? Contact vs psoriatic vs other - for triam cr prn, consider derm f/u

## 2020-03-09 NOTE — Patient Instructions (Signed)
Please take all new medication as prescribed - the cream  Make sure to f/u with your podiatrist  Please continue all other medications as before, and refills have been done if requested.  Please have the pharmacy call with any other refills you may need.  Please continue your efforts at being more active, low cholesterol diet, and weight control.  Please keep your appointments with your specialists as you may have planned

## 2020-03-09 NOTE — Assessment & Plan Note (Signed)
Pt to f/u bp at home to ensure good control < 140/90 and call in 10 days with average

## 2020-03-09 NOTE — Assessment & Plan Note (Signed)
Stable, cont hearing aids, delcines f/u audiology

## 2020-03-09 NOTE — Assessment & Plan Note (Signed)
Chronic stable, cont current pain tx

## 2020-03-09 NOTE — Assessment & Plan Note (Signed)
With pain bilateral, no ulcers, encouraged pt to f/u with podiatry for callous management

## 2020-03-14 ENCOUNTER — Telehealth: Payer: Self-pay | Admitting: Internal Medicine

## 2020-03-14 NOTE — Telephone Encounter (Signed)
Patient called and was wondering how long he needs to [place] triamcinolone (KENALOG) 0.1 %. Please advise.

## 2020-03-15 NOTE — Telephone Encounter (Signed)
Follow up message   Please return call to patient Patient states the cream is working very well.

## 2020-03-15 NOTE — Telephone Encounter (Signed)
LVM instructing pt to return call. Need to find out if the cream is working to determine if he needs to continue, d/c, or come back for OV.

## 2020-03-16 NOTE — Telephone Encounter (Signed)
per OV note 03/09/20 advised to f/u if symptoms worsen or fail to improve; Pt advised that he no longer needs use cream if it has worked. Pt states that red nickel circle not as red or visible.  Pt advised to continue to use it in that place only & to call office if symptoms return or worsen. Pt verb understanding.

## 2020-03-28 ENCOUNTER — Ambulatory Visit (INDEPENDENT_AMBULATORY_CARE_PROVIDER_SITE_OTHER): Payer: Medicare Other | Admitting: Psychology

## 2020-03-28 DIAGNOSIS — F330 Major depressive disorder, recurrent, mild: Secondary | ICD-10-CM

## 2020-05-03 ENCOUNTER — Ambulatory Visit (INDEPENDENT_AMBULATORY_CARE_PROVIDER_SITE_OTHER): Payer: Medicare Other | Admitting: Psychology

## 2020-05-03 DIAGNOSIS — F330 Major depressive disorder, recurrent, mild: Secondary | ICD-10-CM

## 2020-05-07 ENCOUNTER — Other Ambulatory Visit: Payer: Self-pay | Admitting: Internal Medicine

## 2020-05-08 ENCOUNTER — Telehealth: Payer: Self-pay | Admitting: Internal Medicine

## 2020-05-08 NOTE — Telephone Encounter (Signed)
Patient wondering if Dr. Jenny Reichmann recommends the second covid booster shot for him.

## 2020-05-08 NOTE — Telephone Encounter (Signed)
At this time there is a lot of hesitation about the second booster as the process for approval by the FDA did not involve an expert panel , the evidence in [place] that the effect was short lived at least for omicron, and more and more concern about the unknown and possible long term autoimmune side effects.  So I personally would not want this, and the health system is not mandating it for [place] to keep our jobs.  I can understand that you might want it at your age given the uptick in the subvariant going on.  But at this point I cannot say it is necessary.  thanks

## 2020-05-08 NOTE — Telephone Encounter (Signed)
Patient notified and verbalizes understanding.

## 2020-05-29 ENCOUNTER — Ambulatory Visit (INDEPENDENT_AMBULATORY_CARE_PROVIDER_SITE_OTHER): Payer: Medicare Other | Admitting: Psychology

## 2020-05-29 DIAGNOSIS — F330 Major depressive disorder, recurrent, mild: Secondary | ICD-10-CM | POA: Diagnosis not present

## 2020-06-27 LAB — HM DIABETES EYE EXAM

## 2020-06-28 ENCOUNTER — Encounter: Payer: Self-pay | Admitting: Internal Medicine

## 2020-10-24 ENCOUNTER — Other Ambulatory Visit: Payer: Self-pay

## 2020-10-25 ENCOUNTER — Encounter: Payer: Self-pay | Admitting: Internal Medicine

## 2020-10-25 ENCOUNTER — Ambulatory Visit (INDEPENDENT_AMBULATORY_CARE_PROVIDER_SITE_OTHER): Payer: Medicare Other | Admitting: Internal Medicine

## 2020-10-25 VITALS — BP 152/78 | HR 92 | Temp 98.2°F | Ht 66.0 in | Wt 171.0 lb

## 2020-10-25 DIAGNOSIS — R739 Hyperglycemia, unspecified: Secondary | ICD-10-CM

## 2020-10-25 DIAGNOSIS — E7800 Pure hypercholesterolemia, unspecified: Secondary | ICD-10-CM | POA: Diagnosis not present

## 2020-10-25 DIAGNOSIS — E538 Deficiency of other specified B group vitamins: Secondary | ICD-10-CM

## 2020-10-25 DIAGNOSIS — Z23 Encounter for immunization: Secondary | ICD-10-CM | POA: Diagnosis not present

## 2020-10-25 DIAGNOSIS — R030 Elevated blood-pressure reading, without diagnosis of hypertension: Secondary | ICD-10-CM | POA: Diagnosis not present

## 2020-10-25 DIAGNOSIS — N320 Bladder-neck obstruction: Secondary | ICD-10-CM

## 2020-10-25 DIAGNOSIS — E559 Vitamin D deficiency, unspecified: Secondary | ICD-10-CM

## 2020-10-25 MED ORDER — IBUPROFEN 800 MG PO TABS: 800.0000 mg | ORAL_TABLET | Freq: Three times a day (TID) | ORAL | 5 refills | Status: DC | PRN

## 2020-10-25 MED ORDER — ALPRAZOLAM 1 MG PO TABS: ORAL_TABLET | ORAL | 2 refills | Status: DC

## 2020-10-25 MED ORDER — TIZANIDINE HCL 4 MG PO TABS: 4.0000 mg | ORAL_TABLET | Freq: Four times a day (QID) | ORAL | 4 refills | Status: DC | PRN

## 2020-10-25 MED ORDER — PROMETHAZINE HCL 25 MG PO TABS: ORAL_TABLET | ORAL | 5 refills | Status: DC

## 2020-10-25 MED ORDER — CHOLECALCIFEROL 50 MCG (2000 UT) PO TABS: ORAL_TABLET | ORAL | 99 refills | Status: AC

## 2020-10-25 NOTE — Progress Notes (Signed)
Patient ID: Ricky Kelly, male   DOB: Nov 19, 1950, 70 y.o.   MRN: [id number]        Chief Complaint: follow up HTN, HLD and hyperglycemia, low vit d       HPI:  Ricky Kelly is a 70 y.o. male here overall doing ok, Pt denies chest pain, increased sob or doe, wheezing, orthopnea, PND, increased LE swelling, palpitations, dizziness or syncope.  Pt denies new neurological symptoms such as new headache, or facial or extremity weakness or numbness   Pt denies polydipsia, polyuria, or new focal neuro s/s.  Lost balance x 4 in the psat yr but fortunately caught by family, no fall or injury. Not taking Vit D  No new complaints   BP at home < 140/90 per pt  Wt Readings from Last 3 Encounters:  10/25/20 171 lb (77.6 kg)  03/09/20 177 lb (80.3 kg)  10/20/19 168 lb (76.2 kg)   BP Readings from Last 3 Encounters:  10/25/20 (!) 152/78  03/09/20 (!) 144/82  10/20/19 130/82         Past Medical History:  Diagnosis Date   Adjustment disorder 1/10   with depressive symptoms   Allergic rhinitis    Anxiety    Arthritis    Chronic cervical radiculopathy 02/02/2013   Chronic low back pain 08/12/2010   Depression    Hearing loss    sensorineural  bilateral   Hemorrhoids    HOH (hard of hearing)    Hyperlipidemia    Hyperlipidemia    Lumbago    Psoriasis    Retinal tear    bilateral no surgery   Shingles    Past Surgical History:  Procedure Laterality Date   CATARACT EXTRACTION Left    detatched retina Left    laser surgery   EYE SURGERY     HEMORRHOID SURGERY      reports that he has never smoked. He has never used smokeless tobacco. He reports that he does not drink alcohol and does not use drugs. family history includes Cancer in his father and mother; Diabetes in an other family member. Allergies  Allergen Reactions   Allegra [Fexofenadine Hcl] Other (See Comments)    constipation   Escitalopram Oxalate     constipation   Naproxen     constipation   Statins Other (See  Comments)    constipation   Current Outpatient Medications on File Prior to Visit  Medication Sig Dispense Refill   acetaminophen (TYLENOL) 500 MG tablet Take 500 mg by mouth every 6 (six) hours as needed.     Calcitriol 3 MCG/GM cream Apply topically at bedtime.     carboxymethylcellulose (REFRESH PLUS) 0.5 % SOLN 1 drop daily as needed.     dimenhyDRINATE (DRAMAMINE) 50 MG tablet Take 25 mg by mouth daily.     Polyethyl Glycol-Propyl Glycol (SYSTANE OP) Apply to eye 3 (three) times daily.     triamcinolone (KENALOG) 0.1 % Apply 1 application topically 2 (two) times daily. 30 g 0   Vitamin D, Ergocalciferol, (DRISDOL) 1.25 MG (50000 UNIT) CAPS capsule Take 1 capsule (50,000 Units total) by mouth every 7 (seven) days. (Patient not taking: No sig reported) 12 capsule 0   No current facility-administered medications on file prior to visit.        ROS:  All others reviewed and negative.  Objective        PE:  BP (!) 152/78 (BP Location: Left Arm, Patient Position: Sitting, Cuff Size: Normal)  Pulse 92   Temp 98.2 F (36.8 C) (Oral)   Ht 5\' 6"  (1.676 m)   Wt 171 lb (77.6 kg)   SpO2 100%   BMI 27.60 kg/m                 Constitutional: Pt appears in NAD               HENT: Head: NCAT.                Right Ear: External ear normal.                 Left Ear: External ear normal.                Eyes: . Pupils are equal, round, and reactive to light. Conjunctivae and EOM are normal               Nose: without d/c or deformity               Neck: Neck supple. Gross normal ROM               Cardiovascular: Normal rate and regular rhythm.                 Pulmonary/Chest: Effort normal and breath sounds without rales or wheezing.                Abd:  Soft, NT, ND, + BS, no organomegaly               Neurological: Pt is alert. At baseline orientation, motor grossly intact               Skin: Skin is warm. No rashes, no other new lesions, LE edema - none               Psychiatric: Pt  behavior is normal without agitation   Micro: none  Cardiac tracings I have personally interpreted today:  none  Pertinent Radiological findings (summarize): none   Lab Results  Component Value Date   WBC 7.9 10/20/2019   HGB 14.6 10/20/2019   HCT 43.2 10/20/2019   PLT 199 10/20/2019   GLUCOSE 83 10/20/2019   CHOL 210 (H) 10/20/2019   TRIG 150 (H) 10/20/2019   HDL 51 10/20/2019   LDLDIRECT 100.0 09/18/2016   LDLCALC 133 (H) 10/20/2019   ALT 20 10/20/2019   AST 21 10/20/2019   NA 136 10/20/2019   K 4.5 10/20/2019   CL 98 10/20/2019   CREATININE 0.90 10/20/2019   BUN 10 10/20/2019   CO2 29 10/20/2019   TSH 1.98 10/20/2019   PSA 0.79 10/20/2019   HGBA1C 5.5 10/20/2019   Assessment/Plan:  Ricky Kelly is a 70 y.o. White or Caucasian [1] male with  has a past medical history of Adjustment disorder (1/10), Allergic rhinitis, Anxiety, Arthritis, Chronic cervical radiculopathy (02/02/2013), Chronic low back pain (08/12/2010), Depression, Hearing loss, Hemorrhoids, HOH (hard of hearing), Hyperlipidemia, Hyperlipidemia, Lumbago, Psoriasis, Retinal tear, and Shingles.  Hyperlipidemia Lab Results  Component Value Date   LDLCALC 133 (H) 10/20/2019   uncontrolled, pt to continue current lo chol diet, declines statin   Hyperglycemia Lab Results  Component Value Date   HGBA1C 5.5 10/20/2019   Stable, pt to continue current medical treatment  - diet   Blood pressure elevated without history of HTN BP Readings from Last 3 Encounters:  10/25/20 (!) 152/78  03/09/20 (!) 144/82  10/20/19 130/82   Stable, pt to  continue tx - diet, wt control - declines new med start for BP tx   Vitamin D deficiency Last vitamin D Lab Results  Component Value Date   VD25OH 16 (L) 10/20/2019   Low, to start oral replacement  Followup: Return in about 6 months (around 04/25/2021).  Cathlean Cower, MD 10/27/2020 6:53 PM [facility]

## 2020-10-25 NOTE — Patient Instructions (Addendum)
Please take OTC Vitamin D3 at 2000 units per day, indefinitely  Please continue all other medications as before, and refills have been done if requested.  Please have the pharmacy call with any other refills you may need.  Please continue your efforts at being more active, low cholesterol diet, and weight control.  You are otherwise up to date with prevention measures today.  Please keep your appointments with your specialists as you may have planned  Please go to the LAB at the blood drawing area for the tests to be done  You will be contacted by phone if any changes need to be made immediately.  Otherwise, you will receive a letter about your results with an explanation, but please check with MyChart first.  Please remember to sign up for MyChart if you have not done so, as this will be important to you in the future with finding out test results, communicating by private email, and scheduling acute appointments online when needed.  Please make an Appointment to return in 6 months, or sooner if needed 

## 2020-10-26 ENCOUNTER — Telehealth: Payer: Self-pay | Admitting: Internal Medicine

## 2020-10-26 NOTE — Telephone Encounter (Signed)
Yes that's all I can say is to be careful shaving, and if cut there is a small chance of infection like cutting anything else, so it is only a very small chance    thanks

## 2020-10-26 NOTE — Telephone Encounter (Signed)
Seeking advice  Patient calling for advice on skin tag on face. Patient states it was discussed during ov 10/6. He wants to know the best way to shave or should he see a specialist  Please call

## 2020-10-26 NOTE — Telephone Encounter (Signed)
Patient wants to know how to shave appropriately around the skin tag and if he mistakenly cuts it, can it get infected

## 2020-10-26 NOTE — Telephone Encounter (Signed)
Since this appeared to be benign, no tx is needed, but if wants I can refer to dermaotology

## 2020-10-27 ENCOUNTER — Encounter: Payer: Self-pay | Admitting: Internal Medicine

## 2020-10-27 NOTE — Assessment & Plan Note (Signed)
Lab Results  Component Value Date   HGBA1C 5.5 10/20/2019   Stable, pt to continue current medical treatment  - diet

## 2020-10-27 NOTE — Assessment & Plan Note (Signed)
Last vitamin D Lab Results  Component Value Date   VD25OH 16 (L) 10/20/2019   Low, to start oral replacement

## 2020-10-27 NOTE — Assessment & Plan Note (Signed)
Lab Results  Component Value Date   LDLCALC 133 (H) 10/20/2019   uncontrolled, pt to continue current lo chol diet, declines statin

## 2020-10-27 NOTE — Assessment & Plan Note (Signed)
BP Readings from Last 3 Encounters:  10/25/20 (!) 152/78  03/09/20 (!) 144/82  10/20/19 130/82   Stable, pt to continue tx - diet, wt control - declines new med start for BP tx

## 2020-10-31 NOTE — Telephone Encounter (Signed)
Patient notified

## 2020-12-28 ENCOUNTER — Ambulatory Visit (INDEPENDENT_AMBULATORY_CARE_PROVIDER_SITE_OTHER): Payer: Medicare Other | Admitting: Psychology

## 2020-12-28 DIAGNOSIS — F331 Major depressive disorder, recurrent, moderate: Secondary | ICD-10-CM | POA: Diagnosis not present
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
## 2021-01-31 ENCOUNTER — Ambulatory Visit (INDEPENDENT_AMBULATORY_CARE_PROVIDER_SITE_OTHER): Payer: Medicare Other | Admitting: Psychology

## 2021-01-31 DIAGNOSIS — F331 Major depressive disorder, recurrent, moderate: Secondary | ICD-10-CM

## 2021-01-31 NOTE — Progress Notes (Signed)
[place] Counselor/Therapist Progress Note  Patient ID: Ricky Kelly, MRN: [id number],    Date: 01/31/2021  Time Spent: 3:00pm-4:00pm   60 minutes   Treatment Type: Individual Therapy  Reported Symptoms: sadness  Mental Status Exam: Appearance:  NA     Behavior: Appropriate  Motor: Normal  Speech/Language:  Normal Rate  Affect: Appropriate  Mood: normal  Thought process: normal  Thought content:   WNL  Sensory/Perceptual disturbances:   WNL  Orientation: oriented to person, place, time/date, and situation  Attention: Good  Concentration: Good  Memory: WNL  Fund of knowledge:  Good  Insight:   Good  Judgment:  Good  Impulse Control: Good   Risk Assessment: Danger to Self:  No Self-injurious Behavior: No Danger to Others: No Duty to Warn:no Physical Aggression / Violence:No  Access to Firearms a concern: No  Gang Involvement:No   Subjective: [name] present for individual therapy through phone call.   Pt consented to telehealth phone session due to his disabilities and due to [place] 19 pandemic.  Location of pt: home. Location therapist: home office.   Pt talked about his holidays.   He spent Christmas at his ex-wife's house.   Pt's ex and her significant other, pt's daughter and her significant other, and himself were all present.  Pt states it was a nice time.    Pt talked about having disappointment regarding his son.  Pt's son is 37 yo who lives in the same apartment building as pt.   Pt's son started dating a woman a year and a half ago.   Pt states the first 21 years of his son's life his son affirmed pt as being the best dad ever and they had a close relationship.   When pt got involved with this woman he moved out of pt's house and pt rarely sees him now.   Pt states his "heart is broken" and he is "hurt and disrespected".  When pt's son was 88 and 89 years old he told pt he will always be there for pt and always be with him.  Pt states he  needs his son and his help and feels he can not count on him now.  Pt's son's girlfriend does not want anything to do with pt or family.  Pt's son Ricky Kelly lost his job and has maxed out credit cards.  Ricky Kelly also dropped out of school.  Addressed pt's thoughts and feelings about his relationship with his son.  Helped pt process his feelings and relationship dynamics.   Provided supportive therapy.    Interventions: Cognitive Behavioral Therapy and Insight-Oriented  Diagnosis: F33.1  Plan:  Recommend ongoing therapy. Pt participated in setting treatment goals.  Pt wants a safe place to talk and to vent.  Pt wants to improve coping skills.   Plan to meet once a month.   Treatment Plan  (target date:  12/28/2021) Client Abilities/Strengths  Pt is bright, engaging, and motivated for therapy.  Client Treatment Preferences  Individual therapy.  Client Statement of Needs  Improve coping skills.  Symptoms  Depressed or irritable mood.  Diminished interest in or enjoyment of activities.  Problems Addressed  Unipolar Depression Goals 1. Alleviate depressive symptoms and return to previous level of effective functioning. 2. Appropriately grieve the loss in order to normalize mood and to return to previously adaptive level of functioning. Objective Learn and implement behavioral strategies to overcome depression. Target Date: 2021-12-28 Frequency: monthly  Progress: 10 Modality: individual  Related  Interventions Engage the client in "behavioral activation," increasing his/her activity level and contact with sources of reward, while identifying processes that inhibit activation.  Use behavioral techniques such as instruction, rehearsal, role-playing, role reversal, as needed, to facilitate activity in the client's daily life; reinforce success. Assist the client in developing skills that increase the likelihood of deriving pleasure from behavioral activation (e.g., assertiveness skills, developing an  exercise plan, less internal/more external focus, increased social involvement); reinforce success. Objective Identify important people in life, past and present, and describe the quality, good and poor, of those relationships. Target Date: 2021-12-28 Frequency: monthly  Progress: 10 Modality: individual  Related Interventions Conduct Interpersonal Therapy beginning with the assessment of the client's "interpersonal inventory" of important past and present relationships; develop a case formulation linking depression to grief, interpersonal role disputes, role transitions, and/or interpersonal deficits). Objective Learn and implement problem-solving and decision-making skills. Target Date: 2021-12-28 Frequency: monthly  Progress: 10 Modality: individual  Related Interventions Conduct Problem-Solving Therapy using techniques such as psychoeducation, modeling, and role-playing to teach client problem-solving skills (i.e., defining a problem specifically, generating possible solutions, evaluating the pros and cons of each solution, selecting and implementing a plan of action, evaluating the efficacy of the plan, accepting or revising the plan); role-play application of the problem-solving skill to a real life issue. Encourage in the client the development of a positive problem orientation in which problems and solving them are viewed as a natural part of life and not something to be feared, despaired, or avoided. 3. Develop healthy interpersonal relationships that lead to the alleviation and help prevent the relapse of depression. 4. Develop healthy thinking patterns and beliefs about self, others, and the world that lead to the alleviation and help prevent the relapse of depression. 5. Recognize, accept, and cope with feelings of depression. Diagnosis F33.1  Conditions For Discharge Achievement of treatment goals and objectives   Clint Bolder, LCSW

## 2021-01-31 NOTE — Progress Notes (Deleted)
° ° ° ° ° ° ° ° ° ° ° ° ° ° °  Bellamia Ferch, LCSW °

## 2021-02-08 ENCOUNTER — Other Ambulatory Visit: Payer: Self-pay

## 2021-02-08 ENCOUNTER — Ambulatory Visit (INDEPENDENT_AMBULATORY_CARE_PROVIDER_SITE_OTHER): Payer: Medicare Other | Admitting: Nurse Practitioner

## 2021-02-08 ENCOUNTER — Encounter: Payer: Self-pay | Admitting: Nurse Practitioner

## 2021-02-08 VITALS — BP 136/70 | HR 85 | Temp 98.4°F | Ht 66.0 in | Wt 172.0 lb

## 2021-02-08 DIAGNOSIS — T148XXA Other injury of unspecified body region, initial encounter: Secondary | ICD-10-CM | POA: Diagnosis not present

## 2021-02-08 NOTE — Progress Notes (Signed)
Subjective:  Patient ID: Ricky Kelly, male    DOB: December 08, 1950  Age: 71 y.o. MRN: [id number]  CC:  Chief Complaint  Patient presents with   Arm Pain    Started a month ago in the shoulder has radiated to his neck and arm      HPI  This patient arrives today for the above.  He reports his symptoms started 1 month ago.  He has a dull ache to his left arm that comes and goes as well as pain that radiates up into the left side of his neck and down into the left side of his chest.  He denies any known trauma or event that elicited the pain 1 month ago.  Movement triggers the pain.  Rest, heat, Advil improves the pain.  He does report mild tingling intermittently in his left arm but otherwise denies any weakness or other sensory changes that left arm.  He denies any exertional chest pain with physical activity.  Past Medical History:  Diagnosis Date   Adjustment disorder 1/10   with depressive symptoms   Allergic rhinitis    Anxiety    Arthritis    Chronic cervical radiculopathy 02/02/2013   Chronic low back pain 08/12/2010   Depression    Hearing loss    sensorineural  bilateral   Hemorrhoids    HOH (hard of hearing)    Hyperlipidemia    Hyperlipidemia    Lumbago    Psoriasis    Retinal tear    bilateral no surgery   Shingles       Family History  Problem Relation Age of Onset   Cancer Father        lung   Cancer Mother        bladder   Diabetes Other     Social History   Social History Narrative   Originally from [place], [place]   Social History   Tobacco Use   Smoking status: Never   Smokeless tobacco: Never  Substance Use Topics   Alcohol use: No     Current Meds  Medication Sig   acetaminophen (TYLENOL) 500 MG tablet Take 500 mg by mouth every 6 (six) hours as needed.   ALPRAZolam (XANAX) 1 MG tablet 1 tab by mouth three times per day as needed   Calcitriol 3 MCG/GM cream Apply topically at bedtime.   carboxymethylcellulose (REFRESH PLUS)  0.5 % SOLN 1 drop daily as needed.   Cholecalciferol 50 MCG (2000 UT) TABS 1 tab by mouth once daily   dimenhyDRINATE (DRAMAMINE) 50 MG tablet Take 25 mg by mouth daily.   ibuprofen (ADVIL) 800 MG tablet Take 1 tablet (800 mg total) by mouth every 8 (eight) hours as needed.   Polyethyl Glycol-Propyl Glycol (SYSTANE OP) Apply to eye 3 (three) times daily.   promethazine (PHENERGAN) 25 MG tablet TAKE 1 TABLET BY MOUTH EVERY 6 HOURS AS NEEDED FOR NAUSEA AND VOMITING   tiZANidine (ZANAFLEX) 4 MG tablet Take 1 tablet (4 mg total) by mouth every 6 (six) hours as needed for muscle spasms.   triamcinolone (KENALOG) 0.1 % Apply 1 application topically 2 (two) times daily.   Vitamin D, Ergocalciferol, (DRISDOL) 1.25 MG (50000 UNIT) CAPS capsule Take 1 capsule (50,000 Units total) by mouth every 7 (seven) days.    ROS:  Review of Systems  Constitutional:  Negative for chills, diaphoresis and fever.  Respiratory:  Negative for shortness of breath.   Cardiovascular:  Positive for chest  pain (chest wall pain; chest pressure not flared with walking/routine physical activity).  Musculoskeletal:  Positive for joint pain (left shoulder with abduction; radiates to lower neck and shoulder).  Neurological:  Positive for tingling. Negative for weakness.    Objective:   Today's Vitals: BP 136/70 (BP Location: Left Arm, Patient Position: Sitting, Cuff Size: Normal)    Pulse 85    Temp 98.4 F (36.9 C) (Oral)    Ht 5\' 6"  (1.676 m)    Wt 172 lb (78 kg)    SpO2 98%    BMI 27.76 kg/m  Vitals with BMI 02/08/2021 10/25/2020 03/09/2020  Height 5\' 6"  5\' 6"  5\' 6"   Weight 172 lbs 171 lbs 177 lbs  BMI 27.77 86.75 44.92  Systolic 010 071 219  Diastolic 70 78 82  Pulse 85 92 82     Physical Exam Vitals reviewed.  Constitutional:      Appearance: Normal appearance.  HENT:     Head: Normocephalic and atraumatic.  Cardiovascular:     Rate and Rhythm: Normal rate and regular rhythm.  Pulmonary:     Effort: Pulmonary  effort is normal.     Breath sounds: Normal breath sounds.  Musculoskeletal:     Right shoulder: Normal.     Left shoulder: No bony tenderness. Decreased range of motion.     Cervical back: Normal and neck supple. No bony tenderness.  Skin:    General: Skin is warm and dry.  Neurological:     Mental Status: He is alert and oriented to person, place, and time.  Psychiatric:        Mood and Affect: Mood normal.        Behavior: Behavior normal.        Thought Content: Thought content normal.        Judgment: Judgment normal.         Assessment and Plan   1. Muscle strain      Plan: I believe most likely etiology is a muscle strain and/or pinched nerve  We will treat with as needed muscle muscle relaxer.  He has tizanidine on medication list I recommend he takes this as needed, he tells me he has this medication at home.  I recommended we update his blood work and get metabolic panel today, however he refused to do so today, but is agreeable to having this done if he needs further evaluation of his symptoms due to treatment failure.  I recommended that if symptoms persist or worsen over the next week he should call our office at which point would recommend considering getting imaging of cervical spine and/or trialing gabapentin/referral to orthopedist.  He was told if he experiences significant sensory deficits or weakness he should notify us right away.   Tests ordered No orders of the defined types were placed in this encounter.     No orders of the defined types were placed in this encounter.   Patient to follow-up with PCP when due, or sooner as needed.  Ailene Ards, NP

## 2021-02-12 ENCOUNTER — Telehealth: Payer: Self-pay | Admitting: Internal Medicine

## 2021-02-12 NOTE — Telephone Encounter (Signed)
Patient calling in  Recent OV for neck/back pain & Dr. Pearline Cables advised patient to get new pillows  Patient wants to know what kind of pillows provider recommends he purchase  Please call patient [phone]

## 2021-02-14 NOTE — Telephone Encounter (Signed)
Pt called. I advised pt of Ricky Ruths, NP recommendation Foam pillow but not to thick.  Pt expressed understanding.

## 2021-02-14 NOTE — Telephone Encounter (Signed)
Left a message for patient to call me back regarding message from Judson Roch below

## 2021-02-20 NOTE — Telephone Encounter (Signed)
Needs OV b/c recent exam per NP was c/w neck strain, which would not necessarily need further evaluation with imaging

## 2021-02-20 NOTE — Telephone Encounter (Signed)
Pt states he's still having neck pain and would like to request a referral for a ct scan, pt was seen in office on 1-20 for neck pain  Pt requesting c/b [phone]

## 2021-02-21 ENCOUNTER — Other Ambulatory Visit: Payer: Self-pay | Admitting: Nurse Practitioner

## 2021-02-21 DIAGNOSIS — M5412 Radiculopathy, cervical region: Secondary | ICD-10-CM

## 2021-02-21 NOTE — Telephone Encounter (Signed)
I think can probably hold off on cardiology for now; and a plain film for the c spine is not a bad place to start, and I can see him back at any point if he likes, thanks

## 2021-02-21 NOTE — Telephone Encounter (Signed)
Patient declines appointment. States that per NP, if the pain continued he was to call back to have imaging ordered. Advised patient that I would route to NP and call him back.

## 2021-02-25 NOTE — Telephone Encounter (Signed)
Pt called in wanting an update whether he needed to be seen by Dr. Jenny Reichmann or if the Imaging order was place.  Confirmed that the X-ray was ordered and he can anytime. Pt stated that he would come as soon as he felt up to it.

## 2021-02-28 ENCOUNTER — Ambulatory Visit (INDEPENDENT_AMBULATORY_CARE_PROVIDER_SITE_OTHER): Payer: Medicare Other | Admitting: Psychology

## 2021-02-28 ENCOUNTER — Other Ambulatory Visit: Payer: Self-pay

## 2021-02-28 ENCOUNTER — Ambulatory Visit (INDEPENDENT_AMBULATORY_CARE_PROVIDER_SITE_OTHER): Payer: Medicare Other

## 2021-02-28 DIAGNOSIS — M5412 Radiculopathy, cervical region: Secondary | ICD-10-CM

## 2021-02-28 DIAGNOSIS — F331 Major depressive disorder, recurrent, moderate: Secondary | ICD-10-CM | POA: Diagnosis not present

## 2021-02-28 NOTE — Progress Notes (Signed)
[facility] Counselor/Therapist Progress Note  Patient ID: Ricky Kelly, MRN: [id number],    Date: 02/28/2021  Time Spent: 3:00pm-4:00pm   60 minutes   Treatment Type: Individual Therapy  Reported Symptoms: sadness  Mental Status Exam: Appearance:  NA     Behavior: Appropriate  Motor: Normal  Speech/Language:  Normal Rate  Affect: Appropriate  Mood: normal  Thought process: normal  Thought content:   WNL  Sensory/Perceptual disturbances:   WNL  Orientation: oriented to person, place, time/date, and situation  Attention: Good  Concentration: Good  Memory: WNL  Fund of knowledge:  Good  Insight:   Good  Judgment:  Good  Impulse Control: Good   Risk Assessment: Danger to Self:  No Self-injurious Behavior: No Danger to Others: No Duty to Warn:no Physical Aggression / Violence:No  Access to Firearms a concern: No  Gang Involvement:No   Subjective: [name] present for individual therapy through phone call.   Pt consented to telehealth phone session due to his disabilities and due to [place] 19 pandemic.  Location of pt: home. Location therapist: home office.   Pt states things have been "up and down".   Pt has had health issues.  He has had pain in his arm and neck.  He has had heaviness in his chest.  He went to see his provider and tests were done.   His PCP thought he had a pinched nerve in his neck.  The symptoms have persisted so he was scheduled for an xray of his neck and is awaiting the results.  Addressed pt's health concerns.   Pt talked about his kids.  His daughter moved out when she went to college and it was pt and his son Ricky Kelly who lived together after that.  Pt states his son Ricky Kelly promised to always take care of him.  Pt states his son said it so many times that he believed it even though his son was a teenager when he stated that he would always take care of him.   When Ricky Kelly met his current girlfriend Ricky Kelly stopped spending much time at  home.   Ricky Kelly and his mom have a complex relationship and do not get along.  Pt worked hard to [employer] feel welcome even though she disrespected him often.   There were a couple of family get togethers that Ricky Kelly and [place] attended and pt thought everyone had a good time but father's day was the last time Ricky Kelly and Ricky Kelly participated in outings with the family.  Pt feels very hurt that Ricky Kelly does not spend time with him anymore.  Ricky Kelly has also been making very irresponsible financial decisions.   Addressed pt's thoughts and feelings about his relationship with his son.  Helped pt process his feelings and relationship dynamics.   Provided supportive therapy.    Interventions: Cognitive Behavioral Therapy and Insight-Oriented  Diagnosis: F33.1  Plan:  Recommend ongoing therapy. Pt participated in setting treatment goals.  Pt wants a safe place to talk and to vent.  Pt wants to improve coping skills.   Plan to meet once a month.   Treatment Plan  (target date:  12/28/2021) Client Abilities/Strengths  Pt is bright, engaging, and motivated for therapy.  Client Treatment Preferences  Individual therapy.  Client Statement of Needs  Improve coping skills.  Symptoms  Depressed or irritable mood.  Diminished interest in or enjoyment of activities.  Problems Addressed  Unipolar Depression Goals 1. Alleviate depressive symptoms and return to  previous level of effective functioning. 2. Appropriately grieve the loss in order to normalize mood and to return to previously adaptive level of functioning. Objective Learn and implement behavioral strategies to overcome depression. Target Date: 2021-12-28 Frequency: monthly  Progress: 10 Modality: individual  Related Interventions Engage the client in "behavioral activation," increasing his/her activity level and contact with sources of reward, while identifying processes that inhibit activation.  Use behavioral techniques such as instruction, rehearsal,  role-playing, role reversal, as needed, to facilitate activity in the client's daily life; reinforce success. Assist the client in developing skills that increase the likelihood of deriving pleasure from behavioral activation (e.g., assertiveness skills, developing an exercise plan, less internal/more external focus, increased social involvement); reinforce success. Objective Identify important people in life, past and present, and describe the quality, good and poor, of those relationships. Target Date: 2021-12-28 Frequency: monthly  Progress: 10 Modality: individual  Related Interventions Conduct Interpersonal Therapy beginning with the assessment of the client's "interpersonal inventory" of important past and present relationships; develop a case formulation linking depression to grief, interpersonal role disputes, role transitions, and/or interpersonal deficits). Objective Learn and implement problem-solving and decision-making skills. Target Date: 2021-12-28 Frequency: monthly  Progress: 10 Modality: individual  Related Interventions Conduct Problem-Solving Therapy using techniques such as psychoeducation, modeling, and role-playing to teach client problem-solving skills (i.e., defining a problem specifically, generating possible solutions, evaluating the pros and cons of each solution, selecting and implementing a plan of action, evaluating the efficacy of the plan, accepting or revising the plan); role-play application of the problem-solving skill to a real life issue. Encourage in the client the development of a positive problem orientation in which problems and solving them are viewed as a natural part of life and not something to be feared, despaired, or avoided. 3. Develop healthy interpersonal relationships that lead to the alleviation and help prevent the relapse of depression. 4. Develop healthy thinking patterns and beliefs about self, others, and the world that lead to the alleviation  and help prevent the relapse of depression. 5. Recognize, accept, and cope with feelings of depression. Diagnosis F33.1  Conditions For Discharge Achievement of treatment goals and objectives   Clint Bolder, LCSW

## 2021-03-06 ENCOUNTER — Telehealth: Payer: Self-pay | Admitting: Internal Medicine

## 2021-03-06 ENCOUNTER — Other Ambulatory Visit: Payer: Medicare Other

## 2021-03-06 NOTE — Telephone Encounter (Signed)
The results from feb 10 - c spine xray  IMPRESSION: No recent fracture is seen. Cervical spondylosis with interval progression since 02/20/2014. There is encroachment of neural foramina in the lower cervical spine caused by uncovertebral spurs and facet hypertrophy  This means there is no fracture or tumors, but even without an MRI there is evidence for a least a couple of areas of possible nerve pinching in the neck spine

## 2021-03-06 NOTE — Telephone Encounter (Signed)
Ok to have pt f/u with sport medicine for this; I would not order an MRI or CT without an OV to make sure he needs it myself

## 2021-03-06 NOTE — Telephone Encounter (Signed)
Pt requesting a c/b for of 02-28-2021 xray results that Jeralyn Ruths ordered

## 2021-03-06 NOTE — Telephone Encounter (Signed)
Called patient and informed him of his x ray results. Patient states when he seen Judson Roch on 02/08/2021 she recommended a CT scan if his pain was not better in about 5 days. Patient states the pain got better but it came back after 10 days. Patient states he is still having pain, stiffness in his neck, pain in his shoulder, dull ache in his left arm , can't raise arm above shoulder height. Pt is requesting CT scan. He also states if a CT scan is not appropriate what can be done for his pain and stiffness.

## 2021-03-07 NOTE — Telephone Encounter (Signed)
Patient scheduled with Dr. Jenny Reichmann 03/11/21.

## 2021-03-11 ENCOUNTER — Other Ambulatory Visit: Payer: Self-pay

## 2021-03-11 ENCOUNTER — Ambulatory Visit (INDEPENDENT_AMBULATORY_CARE_PROVIDER_SITE_OTHER): Payer: Medicare Other

## 2021-03-11 ENCOUNTER — Ambulatory Visit (INDEPENDENT_AMBULATORY_CARE_PROVIDER_SITE_OTHER): Payer: Medicare Other | Admitting: Internal Medicine

## 2021-03-11 ENCOUNTER — Encounter: Payer: Self-pay | Admitting: Internal Medicine

## 2021-03-11 VITALS — BP 128/76 | HR 71 | Temp 98.5°F | Ht 66.0 in | Wt 169.0 lb

## 2021-03-11 DIAGNOSIS — R21 Rash and other nonspecific skin eruption: Secondary | ICD-10-CM | POA: Diagnosis not present

## 2021-03-11 DIAGNOSIS — M5412 Radiculopathy, cervical region: Secondary | ICD-10-CM

## 2021-03-11 DIAGNOSIS — M25512 Pain in left shoulder: Secondary | ICD-10-CM

## 2021-03-11 DIAGNOSIS — E559 Vitamin D deficiency, unspecified: Secondary | ICD-10-CM

## 2021-03-11 DIAGNOSIS — R079 Chest pain, unspecified: Secondary | ICD-10-CM | POA: Diagnosis not present

## 2021-03-11 MED ORDER — CLOTRIMAZOLE-BETAMETHASONE 1-0.05 % EX CREA: 1.0000 "application " | TOPICAL_CREAM | Freq: Every day | CUTANEOUS | 2 refills | Status: AC

## 2021-03-11 NOTE — Progress Notes (Signed)
Patient ID: Ricky Kelly, male   DOB: 10-Jun-1950, 71 y.o.   MRN: [id number]        Chief Complaint: follow up persistent acute and chronic neck pain, left facial rash, chest pain       HPI:  Ricky Kelly is a 71 y.o. male here with c/o acute onset dull left arm aching jan 1, mostly constant, mild without other pain or weakness, but then after 3 wks with increased left neck pain and stiffness, with radiation of pain to the left upper chest, then shortly after to the left shoulder blade area; was seen per NP and tizanidine has helped quite a bit for the neck but still has the other left sided pain overall; now hoping for further eval and tx, and maybe consider heart issue?  Complicating all this is new onset decreased ROM and pain to the left shoulder as well, worse to move the arm, better to rest, mild intermittent, without swelling, rash or trauma, just cant lift the arm overhead as well anymore.  Also in addition has 1 wk onset left facial rash itchy somewhat, started small then enlarging in size, no pain.  Wt Readings from Last 3 Encounters:  03/11/21 169 lb (76.7 kg)  02/08/21 172 lb (78 kg)  10/25/20 171 lb (77.6 kg)   BP Readings from Last 3 Encounters:  03/11/21 128/76  02/08/21 136/70  10/25/20 (!) 152/78         Past Medical History:  Diagnosis Date   Adjustment disorder 1/10   with depressive symptoms   Allergic rhinitis    Anxiety    Arthritis    Chronic cervical radiculopathy 02/02/2013   Chronic low back pain 08/12/2010   Depression    Hearing loss    sensorineural  bilateral   Hemorrhoids    HOH (hard of hearing)    Hyperlipidemia    Hyperlipidemia    Lumbago    Psoriasis    Retinal tear    bilateral no surgery   Shingles    Past Surgical History:  Procedure Laterality Date   CATARACT EXTRACTION Left    detatched retina Left    laser surgery   EYE SURGERY     HEMORRHOID SURGERY      reports that he has never smoked. He has never used smokeless  tobacco. He reports that he does not drink alcohol and does not use drugs. family history includes Cancer in his father and mother; Diabetes in an other family member. Allergies  Allergen Reactions   Allegra [Fexofenadine Hcl] Other (See Comments)    constipation   Escitalopram Oxalate     constipation   Naproxen     constipation   Statins Other (See Comments)    constipation   Current Outpatient Medications on File Prior to Visit  Medication Sig Dispense Refill   acetaminophen (TYLENOL) 500 MG tablet Take 500 mg by mouth every 6 (six) hours as needed.     ALPRAZolam (XANAX) 1 MG tablet 1 tab by mouth three times per day as needed 90 tablet 2   Calcitriol 3 MCG/GM cream Apply topically at bedtime.     carboxymethylcellulose (REFRESH PLUS) 0.5 % SOLN 1 drop daily as needed.     Cholecalciferol 50 MCG (2000 UT) TABS 1 tab by mouth once daily 30 tablet 99   dimenhyDRINATE (DRAMAMINE) 50 MG tablet Take 25 mg by mouth daily.     ibuprofen (ADVIL) 800 MG tablet Take 1 tablet (800 mg total) by  mouth every 8 (eight) hours as needed. 60 tablet 5   Polyethyl Glycol-Propyl Glycol (SYSTANE OP) Apply to eye 3 (three) times daily.     promethazine (PHENERGAN) 25 MG tablet TAKE 1 TABLET BY MOUTH EVERY 6 HOURS AS NEEDED FOR NAUSEA AND VOMITING 30 tablet 5   tiZANidine (ZANAFLEX) 4 MG tablet Take 1 tablet (4 mg total) by mouth every 6 (six) hours as needed for muscle spasms. 60 tablet 4   Vitamin D, Ergocalciferol, (DRISDOL) 1.25 MG (50000 UNIT) CAPS capsule Take 1 capsule (50,000 Units total) by mouth every 7 (seven) days. 12 capsule 0   No current facility-administered medications on file prior to visit.        ROS:  All others reviewed and negative.  Objective        PE:  BP 128/76 (BP Location: Right Arm, Patient Position: Sitting, Cuff Size: Large)    Pulse 71    Temp 98.5 F (36.9 C) (Oral)    Ht 5\' 6"  (1.676 m)    Wt 169 lb (76.7 kg)    SpO2 100%    BMI 27.28 kg/m                  Constitutional: Pt appears in NAD               HENT: Head: NCAT.                Right Ear: External ear normal.                 Left Ear: External ear normal.                Eyes: . Pupils are equal, round, and reactive to light. Conjunctivae and EOM are normal               Nose: without d/c or deformity               Neck: Neck supple. Gross normal ROM               Cardiovascular: Normal rate and regular rhythm.                 Pulmonary/Chest: Effort normal and breath sounds without rales or wheezing.                Abd:  Soft, NT, ND, + BS, no organomegaly               Neurological: Pt is alert. At baseline orientation, motor grossly intact               Skin:  LE edema - none, left face with nontender slightly scaly erythema about 1.5 cm with edges               Psychiatric: Pt behavior is normal without agitation   Micro: none  Cardiac tracings I have personally interpreted today:  none  Pertinent Radiological findings (summarize): none   Lab Results  Component Value Date   WBC 7.9 10/20/2019   HGB 14.6 10/20/2019   HCT 43.2 10/20/2019   PLT 199 10/20/2019   GLUCOSE 83 10/20/2019   CHOL 210 (H) 10/20/2019   TRIG 150 (H) 10/20/2019   HDL 51 10/20/2019   LDLDIRECT 100.0 09/18/2016   LDLCALC 133 (H) 10/20/2019   ALT 20 10/20/2019   AST 21 10/20/2019   NA 136 10/20/2019   K 4.5 10/20/2019   CL 98 10/20/2019   CREATININE 0.90 10/20/2019  BUN 10 10/20/2019   CO2 29 10/20/2019   TSH 1.98 10/20/2019   PSA 0.79 10/20/2019   HGBA1C 5.5 10/20/2019   Assessment/Plan:  Ricky Kelly is a 71 y.o. White or Caucasian [1] male with  has a past medical history of Adjustment disorder (1/10), Allergic rhinitis, Anxiety, Arthritis, Chronic cervical radiculopathy (02/02/2013), Chronic low back pain (08/12/2010), Depression, Hearing loss, Hemorrhoids, HOH (hard of hearing), Hyperlipidemia, Hyperlipidemia, Lumbago, Psoriasis, Retinal tear, and Shingles.  Chronic cervical  radiculopathy With recent acute on chronic muscular and radicular pain, cont tizanidine prn, declines other pain med change, but will refer to [facility] to consider MRI or cortisone  Left shoulder pain A separate issue it seems and probable left rotater cuff disorder, also for ortho referral  Chest pain I asked pt to have cxr to further assess, but very low suspicion o/w for cardiac, declines ecg today  Vitamin D deficiency Last vitamin D Lab Results  Component Value Date   VD25OH 16 (L) 10/20/2019   Low, to start oral replacement   Rash To left face, likely fungal - for lotrisone asd prn  Followup: No follow-ups on file.  Cathlean Cower, MD 03/17/2021 5:26 PM [facility]

## 2021-03-11 NOTE — Patient Instructions (Addendum)
Please take all new medication as prescribed - the cream  Please continue all other medications as before  Please have the pharmacy call with any other refills you may need.  Please keep your appointments with your specialists as you may have planned  You will be contacted regarding the referral for: orthopedic for the neck AND the shoulder -(2 different problems) - to [facility]  Please go to the XRAY Department in the first floor for the x-ray testing  You will be contacted by phone if any changes need to be made immediately.  Otherwise, you will receive a letter about your results with an explanation, but please check with MyChart first.  Please remember to sign up for MyChart if you have not done so, as this will be important to you in the future with finding out test results, communicating by private email, and scheduling acute appointments online when needed.  Please make an Appointment to return in 6 months, or sooner if needed

## 2021-03-15 ENCOUNTER — Telehealth: Payer: Self-pay | Admitting: Internal Medicine

## 2021-03-15 NOTE — Telephone Encounter (Signed)
Pt checking status of 03-11-2021 xray results, informed pt of results and provider's recommendations, advised pt a letter was mailed w/ results on 03-11-2021  Pt expressed understanding,  no additional questions at this time

## 2021-03-17 NOTE — Assessment & Plan Note (Signed)
To left face, likely fungal - for lotrisone asd prn

## 2021-03-17 NOTE — Assessment & Plan Note (Signed)
I asked pt to have cxr to further assess, but very low suspicion o/w for cardiac, declines ecg today

## 2021-03-17 NOTE — Assessment & Plan Note (Signed)
A separate issue it seems and probable left rotater cuff disorder, also for ortho referral

## 2021-03-17 NOTE — Assessment & Plan Note (Signed)
Last vitamin D Lab Results  Component Value Date   VD25OH 16 (L) 10/20/2019   Low, to start oral replacement

## 2021-03-17 NOTE — Assessment & Plan Note (Signed)
With recent acute on chronic muscular and radicular pain, cont tizanidine prn, declines other pain med change, but will refer to [facility] to consider MRI or cortisone

## 2021-03-28 ENCOUNTER — Ambulatory Visit (INDEPENDENT_AMBULATORY_CARE_PROVIDER_SITE_OTHER): Payer: Medicare Other | Admitting: Psychology

## 2021-03-28 DIAGNOSIS — F331 Major depressive disorder, recurrent, moderate: Secondary | ICD-10-CM | POA: Diagnosis not present

## 2021-03-28 NOTE — Progress Notes (Signed)
[facility] Counselor/Therapist Progress Note ? ?Patient ID: Ricky Kelly, MRN: [id number],   ? ?Date: 03/28/2021 ? ?Time Spent: 3:00pm-4:00pm   60 minutes  ? ?Treatment Type: Individual Therapy ? ?Reported Symptoms: sadness ? ?Mental Status Exam: ?Appearance:  NA     ?Behavior: Appropriate  ?Motor: Normal  ?Speech/Language:  Normal Rate  ?Affect: Appropriate  ?Mood: normal  ?Thought process: normal  ?Thought content:   WNL  ?Sensory/Perceptual disturbances:   WNL  ?Orientation: oriented to person, place, time/date, and situation  ?Attention: Good  ?Concentration: Good  ?Memory: WNL  ?Fund of knowledge:  Good  ?Insight:   Good  ?Judgment:  Good  ?Impulse Control: Good  ? ?Risk Assessment: ?Danger to Self:  No ?Self-injurious Behavior: No ?Danger to Others: No ?Duty to Warn:no ?Physical Aggression / Violence:No  ?Access to Firearms a concern: No  ?Gang Involvement:No  ? ?Subjective: [name] present for individual therapy through phone call.   Pt consented to telehealth phone session due to his disabilities and due to [place] 19 pandemic.  ?Location of pt: home. ?Location therapist: home office.   ?Pt talked about his health issues.   His back has been hurting him a lot.   He lives on the second floor so he has not been able to get out much.   ?Pt states that he is tired of living in so much pain.   Pt has had new issues with his health the past month as well.   He has had pain in his arm and neck and chest.   He saw his PCP who did tests and ruled out cardiac issues.   Pt still has the pain in his neck and chest and it's persisted for 8 weeks.  Pt was referred to an orthopedic and is going to get an MRI on his neck and arm.   Pt has not been able to lift things with his left arm.   He has had to rely on his children to help him.   Pt states he does not like to have to rely on his kids.    ?Addressed what pt can do for pain management.   He tries to distract himself with watching tv.   ?Pt states he  also talks with his 3 best friends who are very supportive.   ?Pt wants to meet other people locally and he has gone online and checked out meet up groups but has not had any luck finding a group that fits for him.   Helped pt explore identifying things he can look forward to.  He will go to the apartment pool once it opens up in May.   He will also explore other groups online for social connection.    ?Provided supportive therapy.  ? ? ?Interventions: Cognitive Behavioral Therapy and Insight-Oriented ? ?Diagnosis: F33.1 ? ?Plan:  ?Recommend ongoing therapy. ?Pt participated in setting treatment goals.  Pt wants a safe place to talk and to vent.  Pt wants to improve coping skills.   ?Plan to meet once a month.  ? ?Treatment Plan  (target date:  12/28/2021) ?Client Abilities/Strengths  ?Pt is bright, engaging, and motivated for therapy.  ?Client Treatment Preferences  ?Individual therapy.  ?Client Statement of Needs  ?Improve coping skills.  ?Symptoms  ?Depressed or irritable mood.  Diminished interest in or enjoyment of activities.  ?Problems Addressed  ?Unipolar Depression ?Goals ?1. Alleviate depressive symptoms and return to previous level of effective functioning. ?2. Appropriately grieve the loss  in order to normalize mood and to return to previously adaptive level of functioning. ?Objective ?Learn and implement behavioral strategies to overcome depression. ?Target Date: 2021-12-28 Frequency: monthly  ?Progress: 10 Modality: individual  ?Related Interventions ?Engage the client in "behavioral activation," increasing his/her activity level and contact with sources of reward, while identifying processes that inhibit activation.  Use behavioral techniques such as instruction, rehearsal, role-playing, role reversal, as needed, to facilitate activity in the client's daily life; reinforce success. ?Assist the client in developing skills that increase the likelihood of deriving pleasure from behavioral activation (e.g.,  assertiveness skills, developing an exercise plan, less internal/more external focus, increased social involvement); reinforce success. ?Objective ?Identify important people in life, past and present, and describe the quality, good and poor, of those relationships. ?Target Date: 2021-12-28 Frequency: monthly  ?Progress: 10 Modality: individual  ?Related Interventions ?Conduct Interpersonal Therapy beginning with the assessment of the client's "interpersonal inventory" of important past and present relationships; develop a case formulation linking depression to grief, interpersonal role disputes, role transitions, and/or interpersonal deficits). ?Objective ?Learn and implement problem-solving and decision-making skills. ?Target Date: 2021-12-28 Frequency: monthly  ?Progress: 10 Modality: individual  ?Related Interventions ?Conduct Problem-Solving Therapy using techniques such as psychoeducation, modeling, and role-playing to teach client problem-solving skills (i.e., defining a problem specifically, generating possible solutions, evaluating the pros and cons of each solution, selecting and implementing a plan of action, evaluating the efficacy of the plan, accepting or revising the plan); role-play application of the problem-solving skill to a real life issue. ?Encourage in the client the development of a positive problem orientation in which problems and solving them are viewed as a natural part of life and not something to be feared, despaired, or avoided. ?3. Develop healthy interpersonal relationships that lead to the alleviation and help prevent the relapse of depression. ?4. Develop healthy thinking patterns and beliefs about self, others, and the world that lead to the alleviation and help prevent the relapse of depression. ?5. Recognize, accept, and cope with feelings of depression. ?Diagnosis ?F33.1  ?Conditions For Discharge ?Achievement of treatment goals and objectives  ? ?Lyra Alaimo, LCSW ? ? ?

## 2021-04-24 ENCOUNTER — Ambulatory Visit (INDEPENDENT_AMBULATORY_CARE_PROVIDER_SITE_OTHER): Payer: Medicare Other | Admitting: Psychology

## 2021-04-24 DIAGNOSIS — F331 Major depressive disorder, recurrent, moderate: Secondary | ICD-10-CM

## 2021-04-24 NOTE — Progress Notes (Signed)
[facility] Counselor/Therapist Progress Note ? ?Patient ID: JENCARLOS NICOLSON, MRN: [id number],   ? ?Date: 04/24/2021 ? ?Time Spent: 3:00pm-4:00pm   60 minutes  ? ?Treatment Type: Individual Therapy ? ?Reported Symptoms: sadness ? ?Mental Status Exam: ?Appearance:  NA     ?Behavior: Appropriate  ?Motor: Normal  ?Speech/Language:  Normal Rate  ?Affect: Appropriate  ?Mood: normal  ?Thought process: normal  ?Thought content:   WNL  ?Sensory/Perceptual disturbances:   WNL  ?Orientation: oriented to person, place, time/date, and situation  ?Attention: Good  ?Concentration: Good  ?Memory: WNL  ?Fund of knowledge:  Good  ?Insight:   Good  ?Judgment:  Good  ?Impulse Control: Good  ? ?Risk Assessment: ?Danger to Self:  No ?Self-injurious Behavior: No ?Danger to Others: No ?Duty to Warn:no ?Physical Aggression / Violence:No  ?Access to Firearms a concern: No  ?Gang Involvement:No  ? ?Subjective: Pt [name] present for individual therapy through phone call.   Pt consented to telehealth phone session due to his disabilities and due to [place] 19 pandemic.  ?Location of pt: home. ?Location therapist: home office.   ?Pt states he has been having ups and downs.  He is struggling with chronic pain issues.       ?Addressed what pt can do for pain management.   He tries to distract himself with watching tv.   Pt talked about his hearing loss and how frustrating it can be to communicate with people. ?Pt talked about the upcoming Easter holiday.   He wants to have the family together but his son and daughter are not talking to each other.  There are complex family dynamics with pt's kids and his son's girlfriend.  Addressed the dynamics and helped pt process his feelings.   ?Provided supportive therapy.  ? ? ?Interventions: Cognitive Behavioral Therapy and Insight-Oriented ? ?Diagnosis: F33.1 ? ?Plan:  ?Recommend ongoing therapy. ?Pt participated in setting treatment goals.  Pt wants a safe place to talk and to vent.  Pt  wants to improve coping skills.   ?Plan to meet once a month.  ? ?Treatment Plan  (target date:  12/28/2021) ?Client Abilities/Strengths  ?Pt is bright, engaging, and motivated for therapy.  ?Client Treatment Preferences  ?Individual therapy.  ?Client Statement of Needs  ?Improve coping skills.  ?Symptoms  ?Depressed or irritable mood.  Diminished interest in or enjoyment of activities.  ?Problems Addressed  ?Unipolar Depression ?Goals ?1. Alleviate depressive symptoms and return to previous level of effective functioning. ?2. Appropriately grieve the loss in order to normalize mood and to return to previously adaptive level of functioning. ?Objective ?Learn and implement behavioral strategies to overcome depression. ?Target Date: 2021-12-28 Frequency: monthly  ?Progress: 10 Modality: individual  ?Related Interventions ?Engage the client in "behavioral activation," increasing his/her activity level and contact with sources of reward, while identifying processes that inhibit activation.  Use behavioral techniques such as instruction, rehearsal, role-playing, role reversal, as needed, to facilitate activity in the client's daily life; reinforce success. ?Assist the client in developing skills that increase the likelihood of deriving pleasure from behavioral activation (e.g., assertiveness skills, developing an exercise plan, less internal/more external focus, increased social involvement); reinforce success. ?Objective ?Identify important people in life, past and present, and describe the quality, good and poor, of those relationships. ?Target Date: 2021-12-28 Frequency: monthly  ?Progress: 10 Modality: individual  ?Related Interventions ?Conduct Interpersonal Therapy beginning with the assessment of the client's "interpersonal inventory" of important past and present relationships; develop a case formulation linking depression  to grief, interpersonal role disputes, role transitions, and/or interpersonal  deficits). ?Objective ?Learn and implement problem-solving and decision-making skills. ?Target Date: 2021-12-28 Frequency: monthly  ?Progress: 10 Modality: individual  ?Related Interventions ?Conduct Problem-Solving Therapy using techniques such as psychoeducation, modeling, and role-playing to teach client problem-solving skills (i.e., defining a problem specifically, generating possible solutions, evaluating the pros and cons of each solution, selecting and implementing a plan of action, evaluating the efficacy of the plan, accepting or revising the plan); role-play application of the problem-solving skill to a real life issue. ?Encourage in the client the development of a positive problem orientation in which problems and solving them are viewed as a natural part of life and not something to be feared, despaired, or avoided. ?3. Develop healthy interpersonal relationships that lead to the alleviation and help prevent the relapse of depression. ?4. Develop healthy thinking patterns and beliefs about self, others, and the world that lead to the alleviation and help prevent the relapse of depression. ?5. Recognize, accept, and cope with feelings of depression. ?Diagnosis ?F33.1  ?Conditions For Discharge ?Achievement of treatment goals and objectives  ? ?Patrici Minnis, LCSW ? ? ?

## 2021-05-23 ENCOUNTER — Ambulatory Visit (INDEPENDENT_AMBULATORY_CARE_PROVIDER_SITE_OTHER): Payer: Medicare Other | Admitting: Psychology

## 2021-05-23 DIAGNOSIS — F331 Major depressive disorder, recurrent, moderate: Secondary | ICD-10-CM

## 2021-05-23 NOTE — Progress Notes (Signed)
[facility] Counselor/Therapist Progress Note ? ?Patient ID: Ricky Kelly, MRN: [id number],   ? ?Date: 05/23/2021 ? ?Time Spent: 3:00pm-4:00pm   60 minutes  ? ?Treatment Type: Individual Therapy ? ?Reported Symptoms: sadness ? ?Mental Status Exam: ?Appearance:  NA     ?Behavior: Appropriate  ?Motor: Normal  ?Speech/Language:  Normal Rate  ?Affect: Appropriate  ?Mood: normal  ?Thought process: normal  ?Thought content:   WNL  ?Sensory/Perceptual disturbances:   WNL  ?Orientation: oriented to person, place, time/date, and situation  ?Attention: Good  ?Concentration: Good  ?Memory: WNL  ?Fund of knowledge:  Good  ?Insight:   Good  ?Judgment:  Good  ?Impulse Control: Good  ? ?Risk Assessment: ?Danger to Self:  No ?Self-injurious Behavior: No ?Danger to Others: No ?Duty to Warn:no ?Physical Aggression / Violence:No  ?Access to Firearms a concern: No  ?Gang Involvement:No  ? ?Subjective: Ricky Kelly present for individual therapy through phone call.   Ricky consented to telehealth phone session due to his disabilities and due to [place] 19 pandemic.  ?Location of Ricky: home. ?Location therapist: home office.   ?Ricky states he has been struggling with allergies.   ?Ricky talked about his son coming over last weekend to watch a show with him.  They had a talk.  His son Yetta Numbers did most of the talking and Ricky listened.  He talked about the issues in the family.   Yetta Numbers is not talking to anyone in the family except for to Ricky.  [name] told Ricky he would like to try to repair/mend the situation in the family.  [name]'s girlfriend Shirlee Limerick is upset with the family.  [name] presented a plan that included writing a letter to his mother Rodena Piety that states all the issues [name] and Shirlee Limerick have had with Rodena Piety.  [name] plans to tell Rodena Piety that she is a narscissist.  [name] wants Ricky to give the letter to [place] when he writes it.   Ricky has a lot of problems with [name]'s plan.  Addressed the family dynamics and helped Ricky process his feelings.  Worked on how Ricky can  effectively communicate his needs and boundaries to [employer].   ?Provided supportive therapy.  ? ? ?Interventions: Cognitive Behavioral Therapy and Insight-Oriented ? ?Diagnosis: F33.1 ? ?Plan:  ?Recommend ongoing therapy. ?Ricky participated in setting treatment goals.  Ricky wants a safe place to talk and to vent.  Ricky wants to improve coping skills.   ?Plan to meet once a month.  ? ?Treatment Plan  (target date:  12/28/2021) ?Client Abilities/Strengths  ?Ricky is bright, engaging, and motivated for therapy.  ?Client Treatment Preferences  ?Individual therapy.  ?Client Statement of Needs  ?Improve coping skills.  ?Symptoms  ?Depressed or irritable mood.  Diminished interest in or enjoyment of activities.  ?Problems Addressed  ?Unipolar Depression ?Goals ?1. Alleviate depressive symptoms and return to previous level of effective functioning. ?2. Appropriately grieve the loss in order to normalize mood and to return to previously adaptive level of functioning. ?Objective ?Learn and implement behavioral strategies to overcome depression. ?Target Date: 2021-12-28 Frequency: monthly  ?Progress: 10 Modality: individual  ?Related Interventions ?Engage the client in "behavioral activation," increasing his/her activity level and contact with sources of reward, while identifying processes that inhibit activation.  Use behavioral techniques such as instruction, rehearsal, role-playing, role reversal, as needed, to facilitate activity in the client's daily life; reinforce success. ?Assist the client in developing skills that increase the likelihood of deriving pleasure from behavioral activation (e.g., assertiveness skills,  developing an exercise plan, less internal/more external focus, increased social involvement); reinforce success. ?Objective ?Identify important people in life, past and present, and describe the quality, good and poor, of those relationships. ?Target Date: 2021-12-28 Frequency: monthly  ?Progress: 10 Modality: individual   ?Related Interventions ?Conduct Interpersonal Therapy beginning with the assessment of the client's "interpersonal inventory" of important past and present relationships; develop a case formulation linking depression to grief, interpersonal role disputes, role transitions, and/or interpersonal deficits). ?Objective ?Learn and implement problem-solving and decision-making skills. ?Target Date: 2021-12-28 Frequency: monthly  ?Progress: 10 Modality: individual  ?Related Interventions ?Conduct Problem-Solving Therapy using techniques such as psychoeducation, modeling, and role-playing to teach client problem-solving skills (i.e., defining a problem specifically, generating possible solutions, evaluating the pros and cons of each solution, selecting and implementing a plan of action, evaluating the efficacy of the plan, accepting or revising the plan); role-play application of the problem-solving skill to a real life issue. ?Encourage in the client the development of a positive problem orientation in which problems and solving them are viewed as a natural part of life and not something to be feared, despaired, or avoided. ?3. Develop healthy interpersonal relationships that lead to the alleviation and help prevent the relapse of depression. ?4. Develop healthy thinking patterns and beliefs about self, others, and the world that lead to the alleviation and help prevent the relapse of depression. ?5. Recognize, accept, and cope with feelings of depression. ?Diagnosis ?F33.1  ?Conditions For Discharge ?Achievement of treatment goals and objectives  ? ?Loren Vicens, LCSW ? ? ? ?

## 2021-06-20 ENCOUNTER — Telehealth (INDEPENDENT_AMBULATORY_CARE_PROVIDER_SITE_OTHER): Payer: Medicare Other | Admitting: Internal Medicine

## 2021-06-20 ENCOUNTER — Encounter: Payer: Self-pay | Admitting: Internal Medicine

## 2021-06-20 ENCOUNTER — Ambulatory Visit (INDEPENDENT_AMBULATORY_CARE_PROVIDER_SITE_OTHER): Payer: Medicare Other | Admitting: Psychology

## 2021-06-20 DIAGNOSIS — E559 Vitamin D deficiency, unspecified: Secondary | ICD-10-CM

## 2021-06-20 DIAGNOSIS — F32A Depression, unspecified: Secondary | ICD-10-CM

## 2021-06-20 DIAGNOSIS — G8929 Other chronic pain: Secondary | ICD-10-CM

## 2021-06-20 DIAGNOSIS — M545 Low back pain, unspecified: Secondary | ICD-10-CM

## 2021-06-20 DIAGNOSIS — F331 Major depressive disorder, recurrent, moderate: Secondary | ICD-10-CM

## 2021-06-20 DIAGNOSIS — N529 Male erectile dysfunction, unspecified: Secondary | ICD-10-CM | POA: Diagnosis not present

## 2021-06-20 NOTE — Assessment & Plan Note (Signed)
Chronic stable, declines need for change in med tx, will continue with therapy

## 2021-06-20 NOTE — Assessment & Plan Note (Signed)
Chronic stable per pt though low quality of life per pt, for advil prn, declines other change for now

## 2021-06-20 NOTE — Progress Notes (Signed)
Ricky Kelly  Patient ID: Ricky Kelly, MRN: [id number],    Date: 06/20/2021  Time Spent: 3:00pm-3:45pm   45 minutes   Treatment Type: Individual Therapy  Reported Symptoms: sadness  Mental Status Exam: Appearance:  NA     Behavior: Appropriate  Motor: Normal  Speech/Language:  Normal Rate  Affect: Appropriate  Mood: normal  Thought process: normal  Thought content:   WNL  Sensory/Perceptual disturbances:   WNL  Orientation: oriented to person, place, time/date, and situation  Attention: Good  Concentration: Good  Memory: WNL  Fund of knowledge:  Good  Insight:   Good  Judgment:  Good  Impulse Control: Good   Risk Assessment: Danger to Self:  No Self-injurious Behavior: No Danger to Others: No Duty to Warn:no Physical Aggression / Violence:No  Access to Firearms a concern: No  Gang Involvement:No   Subjective: [name] present for individual therapy through phone call.   Pt consented to telehealth phone session due to his disabilities and due to [place] 19 pandemic.  Location of pt: home. Location therapist: home office.   Pt talked about his son Ricky Kelly.  Ricky Kelly emailed pt and told him he has been struggling mentally and is not happy and wants to come back to live with pt.   Pt told him he can come back home.  Ricky Kelly promised to help pt with things around the house when he moves back in with him.   Pt did a good job responding to [place] and being supportive.  Currently Ricky Kelly is living with his girlfriend Ricky Kelly but they both plan to move back in with their families. Ricky Kelly wants to finish college and work part time.   Pt states that Ricky Kelly is not a good self starter.  Pt is concerned he may not follow through with promises to help pt with things around the house. Addressed the family dynamics and helped pt process his feelings.  Worked on how pt can effectively communicate his needs and boundaries to Ricky Kelly.   Provided supportive therapy.     Interventions: Cognitive Behavioral Therapy and Insight-Oriented  Diagnosis: F33.1  Plan:  Recommend ongoing therapy. Pt participated in setting treatment goals.  Pt wants a safe place to talk and to vent.  Pt wants to improve coping skills.   Plan to meet once a month.   Treatment Plan  (target date:  12/28/2021) Client Abilities/Strengths  Pt is bright, engaging, and motivated for therapy.  Client Treatment Preferences  Individual therapy.  Client Statement of Needs  Improve coping skills.  Symptoms  Depressed or irritable mood.  Diminished interest in or enjoyment of activities.  Problems Addressed  Unipolar Depression Goals 1. Alleviate depressive symptoms and return to previous level of effective functioning. 2. Appropriately grieve the loss in order to normalize mood and to return to previously adaptive level of functioning. Objective Learn and implement behavioral strategies to overcome depression. Target Date: 2021-12-28 Frequency: monthly  Progress: 10 Modality: individual  Related Interventions Engage the client in "behavioral activation," increasing his/her activity level and contact with sources of reward, while identifying processes that inhibit activation.  Use behavioral techniques such as instruction, rehearsal, role-playing, role reversal, as needed, to facilitate activity in the client's daily life; reinforce success. Assist the client in developing skills that increase the likelihood of deriving pleasure from behavioral activation (e.g., assertiveness skills, developing an exercise plan, less internal/more external focus, increased social involvement); reinforce success. Objective Identify important people in life, past and present,  and describe the quality, good and poor, of those relationships. Target Date: 2021-12-28 Frequency: monthly  Progress: 10 Modality: individual  Related Interventions Conduct Interpersonal Therapy beginning with the assessment of  the client's "interpersonal inventory" of important past and present relationships; develop a case formulation linking depression to grief, interpersonal role disputes, role transitions, and/or interpersonal deficits). Objective Learn and implement problem-solving and decision-making skills. Target Date: 2021-12-28 Frequency: monthly  Progress: 10 Modality: individual  Related Interventions Conduct Problem-Solving Therapy using techniques such as psychoeducation, modeling, and role-playing to teach client problem-solving skills (i.e., defining a problem specifically, generating possible solutions, evaluating the pros and cons of each solution, selecting and implementing a plan of action, evaluating the efficacy of the plan, accepting or revising the plan); role-play application of the problem-solving skill to a real life issue. Encourage in the client the development of a positive problem orientation in which problems and solving them are viewed as a natural part of life and not something to be feared, despaired, or avoided. 3. Develop healthy interpersonal relationships that lead to the alleviation and help prevent the relapse of depression. 4. Develop healthy thinking patterns and beliefs about self, others, and the world that lead to the alleviation and help prevent the relapse of depression. 5. Recognize, accept, and cope with feelings of depression. Diagnosis F33.1  Conditions For Discharge Achievement of treatment goals and objectives   Ricky Bolder, LCSW

## 2021-06-20 NOTE — Patient Instructions (Signed)
You will be contacted regarding the referral for: urology

## 2021-06-20 NOTE — Assessment & Plan Note (Signed)
Last vitamin D Lab Results  Component Value Date   VD25OH 16 (L) 10/20/2019   Low, reminded pt to continue oral replacement, will need lab check next inperson visit

## 2021-06-20 NOTE — Assessment & Plan Note (Signed)
6 mo worsening, declines lab eval or viagra trial for now, ok for urology referral as requested,  to f/u any worsening symptoms or concerns

## 2021-06-20 NOTE — Progress Notes (Signed)
Patient ID: Ricky Kelly, male   DOB: [birth year], 71 y.o.   MRN: [id number]  Cumulative time during 7-day interval 12 min, there was not an associated office visit for this concern within a 7 day period.  Verbal consent for services obtained from patient prior to services given.  Names of all persons present for services: Cathlean Cower, MD, patient  Chief complaint: 6 mo worsening ED, chronic pain, depressed and almost 37, lives alone and handicapped, 80% hearing loss ongoing and miserable life  History, background, results pertinent:  Denies worsening depressive symptoms but is ongoing depressed and miserable, but no suicidal ideation as states is against his religion, or panic; has ongoing anxiety and pain but taking meds well and sees therapist.   Does have worsening ED symptoms over the past 6 months and just does not want to take viagra or similar, as "everything I take makes me constipated" including several statin trials in the past few years.  Denies worsening reflux, abd pain, dysphagia, n/v, bowel change or blood currently.  Asking for urology referral.  Pt denies chest pain, increased sob or doe, wheezing, orthopnea, PND, increased LE swelling, palpitations, dizziness or syncope.   Pt denies polydipsia, polyuria, or new focal neuro s/s.    Past Medical History:  Diagnosis Date   Adjustment disorder 1/10   with depressive symptoms   Allergic rhinitis    Anxiety    Arthritis    Chronic cervical radiculopathy 02/02/2013   Chronic low back pain 08/12/2010   Depression    Hearing loss    sensorineural  bilateral   Hemorrhoids    HOH (hard of hearing)    Hyperlipidemia    Hyperlipidemia    Lumbago    Psoriasis    Retinal tear    bilateral no surgery   Shingles    No results found for this or any previous visit (from the past 32 hour(s)). Current Outpatient Medications on File Prior to Visit  Medication Sig Dispense Refill   acetaminophen (TYLENOL) 500 MG tablet Take 500 mg  by mouth every 6 (six) hours as needed.     ALPRAZolam (XANAX) 1 MG tablet 1 tab by mouth three times per day as needed 90 tablet 2   Calcitriol 3 MCG/GM cream Apply topically at bedtime.     carboxymethylcellulose (REFRESH PLUS) 0.5 % SOLN 1 drop daily as needed.     Cholecalciferol 50 MCG (2000 UT) TABS 1 tab by mouth once daily 30 tablet 99   clotrimazole-betamethasone (LOTRISONE) cream Apply 1 application topically daily. 30 g 2   dimenhyDRINATE (DRAMAMINE) 50 MG tablet Take 25 mg by mouth daily.     ibuprofen (ADVIL) 800 MG tablet Take 1 tablet (800 mg total) by mouth every 8 (eight) hours as needed. 60 tablet 5   Polyethyl Glycol-Propyl Glycol (SYSTANE OP) Apply to eye 3 (three) times daily.     promethazine (PHENERGAN) 25 MG tablet TAKE 1 TABLET BY MOUTH EVERY 6 HOURS AS NEEDED FOR NAUSEA AND VOMITING 30 tablet 5   tiZANidine (ZANAFLEX) 4 MG tablet Take 1 tablet (4 mg total) by mouth every 6 (six) hours as needed for muscle spasms. 60 tablet 4   No current facility-administered medications on file prior to visit.   Lab Results  Component Value Date   WBC 7.9 10/20/2019   HGB 14.6 10/20/2019   HCT 43.2 10/20/2019   PLT 199 10/20/2019   GLUCOSE 83 10/20/2019   CHOL 210 (H) 10/20/2019   TRIG 150 (  H) 10/20/2019   HDL 51 10/20/2019   LDLDIRECT 100.0 09/18/2016   LDLCALC 133 (H) 10/20/2019   ALT 20 10/20/2019   AST 21 10/20/2019   NA 136 10/20/2019   K 4.5 10/20/2019   CL 98 10/20/2019   CREATININE 0.90 10/20/2019   BUN 10 10/20/2019   CO2 29 10/20/2019   TSH 1.98 10/20/2019   PSA 0.79 10/20/2019   HGBA1C 5.5 10/20/2019    A/P/next steps:   ED- see notes  Depression - see notes  Chronic pain - see notes  Low Vit D - see notes  Cathlean Cower MD

## 2021-06-25 NOTE — Addendum Note (Signed)
Addended by: Jeralyn Ruths E on: 06/25/2021 12:30 PM   Modules accepted: Orders

## 2021-07-17 ENCOUNTER — Ambulatory Visit (INDEPENDENT_AMBULATORY_CARE_PROVIDER_SITE_OTHER): Payer: Medicare Other | Admitting: Psychology

## 2021-07-17 DIAGNOSIS — F331 Major depressive disorder, recurrent, moderate: Secondary | ICD-10-CM

## 2021-07-17 NOTE — Progress Notes (Signed)
[facility] Counselor/Therapist Progress Note  Patient ID: TIRON SUSKI, MRN: [id number],    Date: 07/17/2021  Time Spent: 2:00pm-2:55pm   55 minutes   Treatment Type: Individual Therapy  Reported Symptoms: sadness  Mental Status Exam: Appearance:  NA     Behavior: Appropriate  Motor: Normal  Speech/Language:  Normal Rate  Affect: Appropriate  Mood: normal  Thought process: normal  Thought content:   WNL  Sensory/Perceptual disturbances:   WNL  Orientation: oriented to person, place, time/date, and situation  Attention: Good  Concentration: Good  Memory: WNL  Fund of knowledge:  Good  Insight:   Good  Judgment:  Good  Impulse Control: Good   Risk Assessment: Danger to Self:  No Self-injurious Behavior: No Danger to Others: No Duty to Warn:no Physical Aggression / Violence:No  Access to Firearms a concern: No  Gang Involvement:No   Subjective: Pt [name] present for individual therapy through phone call.   Pt consented to telehealth phone session due to his disabilities and due to [place] 19 pandemic.  Location of pt: home. Location therapist: home office.   Pt talked about his son [name].  [name] and Shirlee Limerick have decided to continue living together.  Shirlee Limerick is leveraging [name] to have nothing to do with his mother or she will leave him.  Shirlee Limerick also wants pt to never see Rodena Piety again.    Addressed the family dynamics and helped pt process his feelings.  Worked on how pt can effectively communicate his needs and boundaries to [place].   Provided supportive therapy.    Interventions: Cognitive Behavioral Therapy and Insight-Oriented  Diagnosis: F33.1  Plan:  Recommend ongoing therapy. Pt participated in setting treatment goals.  Pt wants a safe place to talk and to vent.  Pt wants to improve coping skills.   Plan to meet once a month.   Treatment Plan  (target date:  12/28/2021) Client Abilities/Strengths  Pt is bright, engaging, and motivated for therapy.   Client Treatment Preferences  Individual therapy.  Client Statement of Needs  Improve coping skills.  Symptoms  Depressed or irritable mood.  Diminished interest in or enjoyment of activities.  Problems Addressed  Unipolar Depression Goals 1. Alleviate depressive symptoms and return to previous level of effective functioning. 2. Appropriately grieve the loss in order to normalize mood and to return to previously adaptive level of functioning. Objective Learn and implement behavioral strategies to overcome depression. Target Date: 2021-12-28 Frequency: monthly  Progress: 10 Modality: individual  Related Interventions Engage the client in "behavioral activation," increasing his/her activity level and contact with sources of reward, while identifying processes that inhibit activation.  Use behavioral techniques such as instruction, rehearsal, role-playing, role reversal, as needed, to facilitate activity in the client's daily life; reinforce success. Assist the client in developing skills that increase the likelihood of deriving pleasure from behavioral activation (e.g., assertiveness skills, developing an exercise plan, less internal/more external focus, increased social involvement); reinforce success. Objective Identify important people in life, past and present, and describe the quality, good and poor, of those relationships. Target Date: 2021-12-28 Frequency: monthly  Progress: 10 Modality: individual  Related Interventions Conduct Interpersonal Therapy beginning with the assessment of the client's "interpersonal inventory" of important past and present relationships; develop a case formulation linking depression to grief, interpersonal role disputes, role transitions, and/or interpersonal deficits). Objective Learn and implement problem-solving and decision-making skills. Target Date: 2021-12-28 Frequency: monthly  Progress: 10 Modality: individual  Related Interventions Conduct  Problem-Solving Therapy using techniques such  as psychoeducation, modeling, and role-playing to teach client problem-solving skills (i.e., defining a problem specifically, generating possible solutions, evaluating the pros and cons of each solution, selecting and implementing a plan of action, evaluating the efficacy of the plan, accepting or revising the plan); role-play application of the problem-solving skill to a real life issue. Encourage in the client the development of a positive problem orientation in which problems and solving them are viewed as a natural part of life and not something to be feared, despaired, or avoided. 3. Develop healthy interpersonal relationships that lead to the alleviation and help prevent the relapse of depression. 4. Develop healthy thinking patterns and beliefs about self, others, and the world that lead to the alleviation and help prevent the relapse of depression. 5. Recognize, accept, and cope with feelings of depression. Diagnosis F33.1  Conditions For Discharge Achievement of treatment goals and objectives   Clint Bolder, LCSW

## 2021-07-19 ENCOUNTER — Other Ambulatory Visit: Payer: Self-pay | Admitting: Internal Medicine

## 2021-08-22 ENCOUNTER — Ambulatory Visit (INDEPENDENT_AMBULATORY_CARE_PROVIDER_SITE_OTHER): Payer: Medicare Other | Admitting: Psychology

## 2021-08-22 DIAGNOSIS — F331 Major depressive disorder, recurrent, moderate: Secondary | ICD-10-CM | POA: Diagnosis not present

## 2021-08-22 NOTE — Progress Notes (Signed)
[facility] Counselor/Therapist Progress Note  Patient ID: LEXIE MORINI, MRN: [id number],    Date: 08/22/2021  Time Spent: 3:00pm-3:55pm   55 minutes   Treatment Type: Individual Therapy  Reported Symptoms: sadness, worry  Mental Status Exam: Appearance:  NA     Behavior: Appropriate  Motor: Normal  Speech/Language:  Normal Rate  Affect: Appropriate  Mood: normal  Thought process: normal  Thought content:   WNL  Sensory/Perceptual disturbances:   WNL  Orientation: oriented to person, place, time/date, and situation  Attention: Good  Concentration: Good  Memory: WNL  Fund of knowledge:  Good  Insight:   Good  Judgment:  Good  Impulse Control: Good   Risk Assessment: Danger to Self:  No Self-injurious Behavior: No Danger to Others: No Duty to Warn:no Physical Aggression / Violence:No  Access to Firearms a concern: No  Gang Involvement:No   Subjective: Pt [name] present for individual therapy through phone call.   Pt consented to telehealth phone session due to his disabilities and due to [place] 19 pandemic.  Location of pt: home. Location therapist: home office.   Pt talked about his summer.  Nothing that he planned regarding vacation worked out.  Addressed pt's disappointment.   Pt talked about his son [name].   Pt states that [name] has had a lot of financial problems.    Pt and Nick's mother have had to bail him out financially.   Pt gave [name] a loan this year and expects him to eventually pay him back.   Pt's best friend recently gave [name] $5,000.00 so he can go back to [employer] to take classes and finish his college degree.   Pt states [name] is not seeming happy about this and when he asked [name] he said he still feels down and at times "wants to die".   Worked with pt on how he can communicate with [name] about his safety and to encourage him to go to therapy.  Educated pt's on how to access resources for [place].     Provided supportive therapy.     Interventions: Cognitive Behavioral Therapy and Insight-Oriented  Diagnosis: F33.1  Plan:  Recommend ongoing therapy. Pt participated in setting treatment goals.  Pt wants a safe place to talk and to vent.  Pt wants to improve coping skills.   Plan to meet once a month.   Treatment Plan  (target date:  12/28/2021) Client Abilities/Strengths  Pt is bright, engaging, and motivated for therapy.  Client Treatment Preferences  Individual therapy.  Client Statement of Needs  Improve coping skills.  Symptoms  Depressed or irritable mood.  Diminished interest in or enjoyment of activities.  Problems Addressed  Unipolar Depression Goals 1. Alleviate depressive symptoms and return to previous level of effective functioning. 2. Appropriately grieve the loss in order to normalize mood and to return to previously adaptive level of functioning. Objective Learn and implement behavioral strategies to overcome depression. Target Date: 2021-12-28 Frequency: monthly  Progress: 10 Modality: individual  Related Interventions Engage the client in "behavioral activation," increasing his/her activity level and contact with sources of reward, while identifying processes that inhibit activation.  Use behavioral techniques such as instruction, rehearsal, role-playing, role reversal, as needed, to facilitate activity in the client's daily life; reinforce success. Assist the client in developing skills that increase the likelihood of deriving pleasure from behavioral activation (e.g., assertiveness skills, developing an exercise plan, less internal/more external focus, increased social involvement); reinforce success. Objective Identify important people in life, past  and present, and describe the quality, good and poor, of those relationships. Target Date: 2021-12-28 Frequency: monthly  Progress: 10 Modality: individual  Related Interventions Conduct Interpersonal Therapy beginning with the assessment of  the client's "interpersonal inventory" of important past and present relationships; develop a case formulation linking depression to grief, interpersonal role disputes, role transitions, and/or interpersonal deficits). Objective Learn and implement problem-solving and decision-making skills. Target Date: 2021-12-28 Frequency: monthly  Progress: 10 Modality: individual  Related Interventions Conduct Problem-Solving Therapy using techniques such as psychoeducation, modeling, and role-playing to teach client problem-solving skills (i.e., defining a problem specifically, generating possible solutions, evaluating the pros and cons of each solution, selecting and implementing a plan of action, evaluating the efficacy of the plan, accepting or revising the plan); role-play application of the problem-solving skill to a real life issue. Encourage in the client the development of a positive problem orientation in which problems and solving them are viewed as a natural part of life and not something to be feared, despaired, or avoided. 3. Develop healthy interpersonal relationships that lead to the alleviation and help prevent the relapse of depression. 4. Develop healthy thinking patterns and beliefs about self, others, and the world that lead to the alleviation and help prevent the relapse of depression. 5. Recognize, accept, and cope with feelings of depression. Diagnosis F33.1  Conditions For Discharge Achievement of treatment goals and objectives   Clint Bolder, LCSW

## 2021-08-29 ENCOUNTER — Telehealth: Payer: Self-pay | Admitting: Internal Medicine

## 2021-08-29 NOTE — Telephone Encounter (Signed)
Patient called and wants to know if the orders for blood work from last year in October, 2022 are still valid - He never came and got his blood drawn and he wold like to come before his appointment in 2 weeks - please advise.

## 2021-08-29 NOTE — Telephone Encounter (Signed)
Left voicemail to notify patient that lab orders have been placed.

## 2021-09-04 LAB — CBC WITH DIFFERENTIAL/PLATELET
Basophils Absolute: 0 10*3/uL (ref 0.0–0.1)
Basophils Relative: 0.7 % (ref 0.0–3.0)
Eosinophils Absolute: 0.2 10*3/uL (ref 0.0–0.7)
Eosinophils Relative: 3 % (ref 0.0–5.0)
HCT: 39.9 % (ref 39.0–52.0)
Hemoglobin: 13.4 g/dL (ref 13.0–17.0)
Lymphocytes Relative: 40.1 % (ref 12.0–46.0)
Lymphs Abs: 2.8 10*3/uL (ref 0.7–4.0)
MCHC: 33.6 g/dL (ref 30.0–36.0)
MCV: 90.2 fl (ref 78.0–100.0)
Monocytes Absolute: 1 10*3/uL (ref 0.1–1.0)
Monocytes Relative: 13.7 % — ABNORMAL HIGH (ref 3.0–12.0)
Neutro Abs: 3 10*3/uL (ref 1.4–7.7)
Neutrophils Relative %: 42.5 % — ABNORMAL LOW (ref 43.0–77.0)
Platelets: 185 10*3/uL (ref 150.0–400.0)
RBC: 4.43 Mil/uL (ref 4.22–5.81)
RDW: 13.4 % (ref 11.5–15.5)
WBC: 7.1 10*3/uL (ref 4.0–10.5)

## 2021-09-04 LAB — URINALYSIS, ROUTINE W REFLEX MICROSCOPIC
Bilirubin Urine: NEGATIVE
Hgb urine dipstick: NEGATIVE
Ketones, ur: NEGATIVE
Leukocytes,Ua: NEGATIVE
Nitrite: NEGATIVE
RBC / HPF: NONE SEEN (ref 0–?)
Specific Gravity, Urine: <=1.005 — AB (ref 1.000–1.030)
Total Protein, Urine: NEGATIVE
Urine Glucose: NEGATIVE
Urobilinogen, UA: 0.2 (ref 0.0–1.0)
WBC, UA: NONE SEEN (ref 0–?)
pH: 6.5 (ref 5.0–8.0)

## 2021-09-04 LAB — HEPATIC FUNCTION PANEL
ALT: 20 U/L (ref 0–53)
AST: 23 U/L (ref 0–37)
Albumin: 4.3 g/dL (ref 3.5–5.2)
Alkaline Phosphatase: 42 U/L (ref 39–117)
Bilirubin, Direct: 0.1 mg/dL (ref 0.0–0.3)
Total Bilirubin: 0.5 mg/dL (ref 0.2–1.2)
Total Protein: 7.2 g/dL (ref 6.0–8.3)

## 2021-09-04 LAB — LIPID PANEL
Cholesterol: 173 mg/dL (ref 0–200)
HDL: 45.8 mg/dL (ref >39.00)
LDL Cholesterol: 104 mg/dL — ABNORMAL HIGH (ref 0–99)
NonHDL: 127.35
Total CHOL/HDL Ratio: 4
Triglycerides: 118 mg/dL (ref 0.0–149.0)
VLDL: 23.6 mg/dL (ref 0.0–40.0)

## 2021-09-04 LAB — BASIC METABOLIC PANEL
BUN: 12 mg/dL (ref 6–23)
CO2: 30 mEq/L (ref 19–32)
Calcium: 9 mg/dL (ref 8.4–10.5)
Chloride: 96 mEq/L (ref 96–112)
Creatinine, Ser: 0.87 mg/dL (ref 0.40–1.50)
GFR: 88.36 mL/min (ref >60.00)
Glucose, Bld: 92 mg/dL (ref 70–99)
Potassium: 4.6 mEq/L (ref 3.5–5.1)
Sodium: 129 mEq/L — ABNORMAL LOW (ref 135–145)

## 2021-09-04 LAB — VITAMIN D 25 HYDROXY (VIT D DEFICIENCY, FRACTURES): VITD: 11.12 ng/mL — ABNORMAL LOW (ref 30.00–100.00)

## 2021-09-04 LAB — VITAMIN B12: Vitamin B-12: 590 pg/mL (ref 211–911)

## 2021-09-04 LAB — TSH: TSH: 2.18 u[IU]/mL (ref 0.35–5.50)

## 2021-09-04 LAB — HEMOGLOBIN A1C: Hgb A1c MFr Bld: 5.8 % (ref 4.6–6.5)

## 2021-09-04 LAB — PSA: PSA: 0.91 ng/mL (ref 0.10–4.00)

## 2021-09-12 ENCOUNTER — Ambulatory Visit (INDEPENDENT_AMBULATORY_CARE_PROVIDER_SITE_OTHER): Payer: Medicare Other | Admitting: Internal Medicine

## 2021-09-12 ENCOUNTER — Encounter: Payer: Self-pay | Admitting: Internal Medicine

## 2021-09-12 VITALS — BP 132/88 | HR 91 | Temp 97.5°F | Ht 66.0 in | Wt 170.2 lb

## 2021-09-12 DIAGNOSIS — E559 Vitamin D deficiency, unspecified: Secondary | ICD-10-CM

## 2021-09-12 DIAGNOSIS — F32A Depression, unspecified: Secondary | ICD-10-CM

## 2021-09-12 DIAGNOSIS — E7800 Pure hypercholesterolemia, unspecified: Secondary | ICD-10-CM | POA: Diagnosis not present

## 2021-09-12 DIAGNOSIS — K5900 Constipation, unspecified: Secondary | ICD-10-CM

## 2021-09-12 NOTE — Progress Notes (Signed)
Patient ID: Ricky Kelly, male   DOB: 05/10/1950, 71 y.o.   MRN: [id number]        Chief Complaint: follow up low vit d, depression, chronic constipation, hld       HPI:  Ricky Kelly is a 71 y.o. male here overall doing ok.  Pt denies chest pain, increased sob or doe, wheezing, orthopnea, PND, increased LE swelling, palpitations, dizziness or syncope.  Conts to struggle with chronic pain and depression, declines any change in tx now, denies SI or HI but just wishes his time was over.  Declines statin for hld.  Not taking Vit d.  Has ongoing opiate related constiopation currently controlled, declines need for change to trulance.         Wt Readings from Last 3 Encounters:  09/12/21 170 lb 3.2 oz (77.2 kg)  03/11/21 169 lb (76.7 kg)  02/08/21 172 lb (78 kg)   BP Readings from Last 3 Encounters:  09/12/21 132/88  03/11/21 128/76  02/08/21 136/70         Past Medical History:  Diagnosis Date   Adjustment disorder 1/10   with depressive symptoms   Allergic rhinitis    Anxiety    Arthritis    Chronic cervical radiculopathy 02/02/2013   Chronic low back pain 08/12/2010   Depression    Hearing loss    sensorineural  bilateral   Hemorrhoids    HOH (hard of hearing)    Hyperlipidemia    Hyperlipidemia    Lumbago    Psoriasis    Retinal tear    bilateral no surgery   Shingles    Past Surgical History:  Procedure Laterality Date   CATARACT EXTRACTION Left    detatched retina Left    laser surgery   EYE SURGERY     HEMORRHOID SURGERY      reports that he has never smoked. He has never used smokeless tobacco. He reports that he does not drink alcohol and does not use drugs. family history includes Cancer in his father and mother; Diabetes in an other family member. Allergies  Allergen Reactions   Allegra [Fexofenadine Hcl] Other (See Comments)    constipation   Escitalopram Oxalate     constipation   Naproxen     constipation   Statins Other (See Comments)     constipation   Current Outpatient Medications on File Prior to Visit  Medication Sig Dispense Refill   acetaminophen (TYLENOL) 500 MG tablet Take 500 mg by mouth every 6 (six) hours as needed.     ALPRAZolam (XANAX) 1 MG tablet Take 1 tablet by mouth three times daily as needed 90 tablet 2   Calcitriol 3 MCG/GM cream Apply topically at bedtime.     carboxymethylcellulose (REFRESH PLUS) 0.5 % SOLN 1 drop daily as needed.     clotrimazole-betamethasone (LOTRISONE) cream Apply 1 application topically daily. 30 g 2   dimenhyDRINATE (DRAMAMINE) 50 MG tablet Take 25 mg by mouth daily.     ibuprofen (ADVIL) 800 MG tablet Take 1 tablet (800 mg total) by mouth every 8 (eight) hours as needed. 60 tablet 5   Polyethyl Glycol-Propyl Glycol (SYSTANE OP) Apply to eye 3 (three) times daily.     promethazine (PHENERGAN) 25 MG tablet TAKE 1 TABLET BY MOUTH EVERY 6 HOURS AS NEEDED FOR NAUSEA AND VOMITING 30 tablet 5   tiZANidine (ZANAFLEX) 4 MG tablet Take 1 tablet (4 mg total) by mouth every 6 (six) hours as needed for muscle  spasms. 60 tablet 4   Cholecalciferol 50 MCG (2000 UT) TABS 1 tab by mouth once daily (Patient not taking: Reported on 09/12/2021) 30 tablet 99   No current facility-administered medications on file prior to visit.        ROS:  All others reviewed and negative.  Objective        PE:  BP 132/88   Pulse 91   Temp (!) 97.5 F (36.4 C)   Ht '5\' 6"'$  (1.676 m)   Wt 170 lb 3.2 oz (77.2 kg)   SpO2 98%   BMI 27.47 kg/m                 Constitutional: Pt appears in NAD               HENT: Head: NCAT.                Right Ear: External ear normal.                 Left Ear: External ear normal.                Eyes: . Pupils are equal, round, and reactive to light. Conjunctivae and EOM are normal               Nose: without d/c or deformity               Neck: Neck supple. Gross normal ROM               Cardiovascular: Normal rate and regular rhythm.                 Pulmonary/Chest:  Effort normal and breath sounds without rales or wheezing.                Abd:  Soft, NT, ND, + BS, no organomegaly               Neurological: Pt is alert. At baseline orientation, motor grossly intact               Skin: Skin is warm. No rashes, no other new lesions, LE edema - none               Psychiatric: Pt behavior is normal without agitation   Micro: none  Cardiac tracings I have personally interpreted today:  none  Pertinent Radiological findings (summarize): none   Lab Results  Component Value Date   WBC 7.1 09/04/2021   HGB 13.4 09/04/2021   HCT 39.9 09/04/2021   PLT 185.0 09/04/2021   GLUCOSE 92 09/04/2021   CHOL 173 09/04/2021   TRIG 118.0 09/04/2021   HDL 45.80 09/04/2021   LDLDIRECT 100.0 09/18/2016   LDLCALC 104 (H) 09/04/2021   ALT 20 09/04/2021   AST 23 09/04/2021   NA 129 (L) 09/04/2021   K 4.6 09/04/2021   CL 96 09/04/2021   CREATININE 0.87 09/04/2021   BUN 12 09/04/2021   CO2 30 09/04/2021   TSH 2.18 09/04/2021   PSA 0.91 09/04/2021   HGBA1C 5.8 09/04/2021   Assessment/Plan:  Ricky Kelly is a 71 y.o. White or Caucasian [1] male with  has a past medical history of Adjustment disorder (1/10), Allergic rhinitis, Anxiety, Arthritis, Chronic cervical radiculopathy (02/02/2013), Chronic low back pain (08/12/2010), Depression, Hearing loss, Hemorrhoids, HOH (hard of hearing), Hyperlipidemia, Hyperlipidemia, Lumbago, Psoriasis, Retinal tear, and Shingles.  Depression Chronic stable per pt, declines need for change in tx at this time  Constipation Chronic stable, declines need for any change at this time  Vitamin D deficiency Last vitamin D Lab Results  Component Value Date   VD25OH 11.12 (L) 09/04/2021   Low, pt to start oral replacement   Hyperlipidemia Lab Results  Component Value Date   LDLCALC 104 (H) 09/04/2021   Uncontrolled, goal ldl < 100, pt to continue current low chol diet, declines statin  Followup: Return in about 6 months  (around 03/15/2022).  Cathlean Cower, MD 09/15/2021 6:45 PM [facility]

## 2021-09-12 NOTE — Patient Instructions (Signed)
Please take OTC Vitamin D3 at 2000 units per day, indefinitely  Please continue all other medications as before, and refills have been done if requested.  Please have the pharmacy call with any other refills you may need.  Please continue your efforts at being more active, low cholesterol diet, and weight control.  You are otherwise up to date with prevention measures today.  Please keep your appointments with your specialists as you may have planned

## 2021-09-15 ENCOUNTER — Encounter: Payer: Self-pay | Admitting: Internal Medicine

## 2021-09-15 NOTE — Assessment & Plan Note (Signed)
Last vitamin D Lab Results  Component Value Date   VD25OH 11.12 (L) 09/04/2021   Low, pt to start oral replacement

## 2021-09-15 NOTE — Assessment & Plan Note (Signed)
Chronic stable per pt, declines need for change in tx at this time

## 2021-09-15 NOTE — Assessment & Plan Note (Signed)
Chronic stable, declines need for any change at this time

## 2021-09-15 NOTE — Assessment & Plan Note (Signed)
Lab Results  Component Value Date   LDLCALC 104 (H) 09/04/2021   Uncontrolled, goal ldl < 100, pt to continue current low chol diet, declines statin

## 2021-09-19 ENCOUNTER — Ambulatory Visit (INDEPENDENT_AMBULATORY_CARE_PROVIDER_SITE_OTHER): Payer: Medicare Other | Admitting: Psychology

## 2021-09-19 DIAGNOSIS — F331 Major depressive disorder, recurrent, moderate: Secondary | ICD-10-CM

## 2021-09-19 NOTE — Progress Notes (Signed)
[facility] Counselor/Therapist Progress Note  Patient ID: VERLON PISCHKE, MRN: [id number],    Date: 09/19/2021  Time Spent: 3:00pm-3:55pm   55 minutes   Treatment Type: Individual Therapy  Reported Symptoms: sadness, worry  Mental Status Exam: Appearance:  NA     Behavior: Appropriate  Motor: Normal  Speech/Language:  Normal Rate  Affect: Appropriate  Mood: normal  Thought process: normal  Thought content:   WNL  Sensory/Perceptual disturbances:   WNL  Orientation: oriented to person, place, time/date, and situation  Attention: Good  Concentration: Good  Memory: WNL  Fund of knowledge:  Good  Insight:   Good  Judgment:  Good  Impulse Control: Good   Risk Assessment: Danger to Self:  No Self-injurious Behavior: No Danger to Others: No Duty to Warn:no Physical Aggression / Violence:No  Access to Firearms a concern: No  Gang Involvement:No   Subjective: Pt [name] present for individual therapy through phone call.   Pt consented to telehealth phone session due to his disabilities and due to [place] 19 pandemic.  Location of pt: home. Location therapist: home office.   Pt talked about issues with his son.  His son has a lot of financial problems.  Pt has given his son financial advice but his son did not adhere to it.  Addressed pt's frustrations and disappointments with his son.  Pt loned [name] $941 and he has not even started to pay him back.  [name] and his girlfriend Shirlee Limerick went to [employer] this week even though they can't afford it.   Pt is very upset with his son for being irresponsible.   Helped pt process his feelings and relationship dynamics.   Pt talked about Labor Day plans at his exwife's house.  Pt's daughter will attend but his son will not bc Shirlee Limerick does not like pts' exwife.  Pt is concerned about the dynamics between the guests.   Provided supportive therapy.    Interventions: Cognitive Behavioral Therapy and Insight-Oriented  Diagnosis:  F33.1  Plan:  Recommend ongoing therapy. Pt participated in setting treatment goals.  Pt wants a safe place to talk and to vent.  Pt wants to improve coping skills.   Plan to meet once a month.   Treatment Plan  (target date:  12/28/2021) Client Abilities/Strengths  Pt is bright, engaging, and motivated for therapy.  Client Treatment Preferences  Individual therapy.  Client Statement of Needs  Improve coping skills.  Symptoms  Depressed or irritable mood.  Diminished interest in or enjoyment of activities.  Problems Addressed  Unipolar Depression Goals 1. Alleviate depressive symptoms and return to previous level of effective functioning. 2. Appropriately grieve the loss in order to normalize mood and to return to previously adaptive level of functioning. Objective Learn and implement behavioral strategies to overcome depression. Target Date: 2021-12-28 Frequency: monthly  Progress: 10 Modality: individual  Related Interventions Engage the client in "behavioral activation," increasing his/her activity level and contact with sources of reward, while identifying processes that inhibit activation.  Use behavioral techniques such as instruction, rehearsal, role-playing, role reversal, as needed, to facilitate activity in the client's daily life; reinforce success. Assist the client in developing skills that increase the likelihood of deriving pleasure from behavioral activation (e.g., assertiveness skills, developing an exercise plan, less internal/more external focus, increased social involvement); reinforce success. Objective Identify important people in life, past and present, and describe the quality, good and poor, of those relationships. Target Date: 2021-12-28 Frequency: monthly  Progress: 10 Modality: individual  Related Interventions Conduct Interpersonal Therapy beginning with the assessment of the client's "interpersonal inventory" of important past and present relationships;  develop a case formulation linking depression to grief, interpersonal role disputes, role transitions, and/or interpersonal deficits). Objective Learn and implement problem-solving and decision-making skills. Target Date: 2021-12-28 Frequency: monthly  Progress: 10 Modality: individual  Related Interventions Conduct Problem-Solving Therapy using techniques such as psychoeducation, modeling, and role-playing to teach client problem-solving skills (i.e., defining a problem specifically, generating possible solutions, evaluating the pros and cons of each solution, selecting and implementing a plan of action, evaluating the efficacy of the plan, accepting or revising the plan); role-play application of the problem-solving skill to a real life issue. Encourage in the client the development of a positive problem orientation in which problems and solving them are viewed as a natural part of life and not something to be feared, despaired, or avoided. 3. Develop healthy interpersonal relationships that lead to the alleviation and help prevent the relapse of depression. 4. Develop healthy thinking patterns and beliefs about self, others, and the world that lead to the alleviation and help prevent the relapse of depression. 5. Recognize, accept, and cope with feelings of depression. Diagnosis F33.1  Conditions For Discharge Achievement of treatment goals and objectives   Clint Bolder, LCSW

## 2021-10-16 ENCOUNTER — Telehealth: Payer: Self-pay | Admitting: Internal Medicine

## 2021-10-16 NOTE — Telephone Encounter (Signed)
Patient wants to know if Dr. Jenny Reichmann recommends the RSV vaccine. Call back number is [phone].  Patient also would like provider to know that he has had his flu vaccine.

## 2021-10-17 ENCOUNTER — Ambulatory Visit: Payer: Medicare Other | Admitting: Psychology

## 2021-10-17 NOTE — Telephone Encounter (Signed)
RSV might be important if he has contact with young children, o/w not likely to be needed, as population risk is low (!00K hospn per year, and 10K deaths per year)

## 2021-10-17 NOTE — Telephone Encounter (Signed)
Spoke with patient and he states that his children are grown and do not have any children of their own so he is not around any kids. He has decided to not get the RSV vaccine.

## 2021-10-17 NOTE — Telephone Encounter (Signed)
Do you recommend the RSV vaccine for this patient?

## 2021-11-12 ENCOUNTER — Ambulatory Visit (INDEPENDENT_AMBULATORY_CARE_PROVIDER_SITE_OTHER): Payer: Medicare Other | Admitting: Psychology

## 2021-11-12 DIAGNOSIS — F331 Major depressive disorder, recurrent, moderate: Secondary | ICD-10-CM
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
## 2021-12-04 ENCOUNTER — Other Ambulatory Visit: Payer: Self-pay | Admitting: Internal Medicine

## 2021-12-19 ENCOUNTER — Ambulatory Visit (INDEPENDENT_AMBULATORY_CARE_PROVIDER_SITE_OTHER): Payer: Medicare Other | Admitting: Psychology

## 2021-12-19 DIAGNOSIS — F331 Major depressive disorder, recurrent, moderate: Secondary | ICD-10-CM | POA: Diagnosis not present

## 2021-12-19 NOTE — Progress Notes (Signed)
[facility] Counselor/Therapist Progress Note  Patient ID: Ricky Kelly, MRN: [id number],    Date: 12/19/2021  Time Spent: 3:00pm-3:55pm   55 minutes   Treatment Type: Individual Therapy  Reported Symptoms: sadness, worry  Mental Status Exam: Appearance:  NA     Behavior: Appropriate  Motor: Normal  Speech/Language:  Normal Rate  Affect: Appropriate  Mood: normal  Thought process: normal  Thought content:   WNL  Sensory/Perceptual disturbances:   WNL  Orientation: oriented to person, place, time/date, and situation  Attention: Good  Concentration: Good  Memory: WNL  Fund of knowledge:  Good  Insight:   Good  Judgment:  Good  Impulse Control: Good   Risk Assessment: Danger to Self:  No Self-injurious Behavior: No Danger to Others: No Duty to Warn:no Physical Aggression / Violence:No  Access to Firearms a concern: No  Gang Involvement:No   Subjective: [name] present for individual therapy through phone call.   Pt consented to telehealth phone session due to his disabilities and due to [place] 19 pandemic.  Location of pt: home. Location therapist: home office.   Pt talked about concerns about his son Ricky Kelly.   Ricky Kelly is moving in with pt today bc Ricky Kelly and his girlfriends broke up.   Pt is glad that the relationship with Ricky Kelly and his girlfriend is over bc it was not a healthy situation.   Pt talked about his relationship with his former therapist Ricky Kelly.   They had worked together for 6 years before she retired.  When she retired she told pt they could be friends.  Ricky Kelly told him she would help him do shopping and carry things up to his apartment since he is disabled.   Pt was very excited about having Ricky Kelly as a friend.  They stayed in touch for awhile and Ricky Kelly took him shopping a couple of times.   Ricky Kelly then cancelled a planned shopping time and they emailed a few times and then she did not have any more contact with pt.  Pt feels very sad and disappointed  that Ricky Kelly has not kept up with him and that the promised friendship did not develop.  Helped pt process his feelings.   Provided supportive therapy.    Interventions: Cognitive Behavioral Therapy and Insight-Oriented  Diagnosis: F33.1  Plan:  Recommend ongoing therapy. Pt participated in setting treatment goals.  Pt wants a safe place to talk and to vent.  Pt wants to improve coping skills.   Plan to meet once a month.   Treatment Plan  (target date:  12/28/2021) Client Abilities/Strengths  Pt is bright, engaging, and motivated for therapy.  Client Treatment Preferences  Individual therapy.  Client Statement of Needs  Improve coping skills.  Symptoms  Depressed or irritable mood.  Diminished interest in or enjoyment of activities.  Problems Addressed  Unipolar Depression Goals 1. Alleviate depressive symptoms and return to previous level of effective functioning. 2. Appropriately grieve the loss in order to normalize mood and to return to previously adaptive level of functioning. Objective Learn and implement behavioral strategies to overcome depression. Target Date: 2021-12-28 Frequency: monthly  Progress: 10 Modality: individual  Related Interventions Engage the client in "behavioral activation," increasing his/her activity level and contact with sources of reward, while identifying processes that inhibit activation.  Use behavioral techniques such as instruction, rehearsal, role-playing, role reversal, as needed, to facilitate activity in the client's daily life; reinforce success. Assist the client in developing skills that increase the likelihood of  deriving pleasure from behavioral activation (e.g., assertiveness skills, developing an exercise plan, less internal/more external focus, increased social involvement); reinforce success. Objective Identify important people in life, past and present, and describe the quality, good and poor, of those relationships. Target Date:  2021-12-28 Frequency: monthly  Progress: 10 Modality: individual  Related Interventions Conduct Interpersonal Therapy beginning with the assessment of the client's "interpersonal inventory" of important past and present relationships; develop a case formulation linking depression to grief, interpersonal role disputes, role transitions, and/or interpersonal deficits). Objective Learn and implement problem-solving and decision-making skills. Target Date: 2021-12-28 Frequency: monthly  Progress: 10 Modality: individual  Related Interventions Conduct Problem-Solving Therapy using techniques such as psychoeducation, modeling, and role-playing to teach client problem-solving skills (i.e., defining a problem specifically, generating possible solutions, evaluating the pros and cons of each solution, selecting and implementing a plan of action, evaluating the efficacy of the plan, accepting or revising the plan); role-play application of the problem-solving skill to a real life issue. Encourage in the client the development of a positive problem orientation in which problems and solving them are viewed as a natural part of life and not something to be feared, despaired, or avoided. 3. Develop healthy interpersonal relationships that lead to the alleviation and help prevent the relapse of depression. 4. Develop healthy thinking patterns and beliefs about self, others, and the world that lead to the alleviation and help prevent the relapse of depression. 5. Recognize, accept, and cope with feelings of depression. Diagnosis F33.1  Conditions For Discharge Achievement of treatment goals and objectives   Clint Bolder, LCSW

## 2021-12-26 ENCOUNTER — Other Ambulatory Visit: Payer: Self-pay | Admitting: Internal Medicine

## 2022-01-23 ENCOUNTER — Ambulatory Visit: Payer: Medicare Other | Admitting: Psychology

## 2022-02-20 ENCOUNTER — Ambulatory Visit: Payer: Medicare Other | Admitting: Psychology

## 2022-02-24 ENCOUNTER — Other Ambulatory Visit: Payer: Self-pay | Admitting: Internal Medicine

## 2022-03-05 ENCOUNTER — Ambulatory Visit (INDEPENDENT_AMBULATORY_CARE_PROVIDER_SITE_OTHER): Payer: Medicare Other

## 2022-03-05 VITALS — Wt 170.0 lb

## 2022-03-05 DIAGNOSIS — Z0000 Encounter for general adult medical examination without abnormal findings: Secondary | ICD-10-CM

## 2022-03-05 NOTE — Progress Notes (Signed)
I connected with  Ricky Kelly on 03/05/22 by a audio enabled telemedicine application and verified that I am speaking with the correct person using two identifiers.  Patient Location: Home  Provider Location: Office/Clinic  I discussed the limitations of evaluation and management by telemedicine. The patient expressed understanding and agreed to proceed.  Subjective:   Ricky Kelly is a 72 y.o. male who presents for Medicare Annual/Subsequent preventive examination.  Review of Systems     Cardiac Risk Factors include: advanced age (>49mn, >>22women)     Objective:    Today's Vitals   03/05/22 1107  PainSc: 5    There is no height or weight on file to calculate BMI.     03/05/2022   11:15 AM 09/30/2018   12:07 PM 03/20/2017    5:01 PM 10/03/2015    2:24 PM  Advanced Directives  Does Patient Have a Medical Advance Directive? No Yes No No  Type of ACorporate treasurerof [place] will    Copy of HVardamanin Chart?  No - copy requested    Would patient like information on creating a medical advance directive? No - Patient declined  Yes (ED - Information included in AVS) No - patient declined information    Current Medications (verified) Outpatient Encounter Medications as of 03/05/2022  Medication Sig   acetaminophen (TYLENOL) 500 MG tablet Take 500 mg by mouth every 6 (six) hours as needed.   ALPRAZolam (XANAX) 1 MG tablet Take 1 tablet by mouth three times daily as needed   Calcitriol 3 MCG/GM cream Apply topically at bedtime.   carboxymethylcellulose (REFRESH PLUS) 0.5 % SOLN 1 drop daily as needed.   Cholecalciferol 50 MCG (2000 UT) TABS 1 tab by mouth once daily   clotrimazole-betamethasone (LOTRISONE) cream Apply 1 application topically daily.   dimenhyDRINATE (DRAMAMINE) 50 MG tablet Take 25 mg by mouth daily.   ibuprofen (ADVIL) 800 MG tablet TAKE 1 TABLET BY MOUTH EVERY 8 HOURS AS NEEDED   Polyethyl  Glycol-Propyl Glycol (SYSTANE OP) Apply to eye 3 (three) times daily.   promethazine (PHENERGAN) 25 MG tablet TAKE 1 TABLET BY MOUTH EVERY 6 HOURS AS NEEDED FOR NAUSEA AND VOMITING   tiZANidine (ZANAFLEX) 4 MG tablet TAKE 1 TABLET BY MOUTH EVERY 6 HOURS AS NEEDED FOR MUSCLE SPASM   No facility-administered encounter medications on file as of 03/05/2022.    Allergies (verified) Allegra [fexofenadine hcl], Escitalopram oxalate, Naproxen, and Statins   History: Past Medical History:  Diagnosis Date   Adjustment disorder 1/10   with depressive symptoms   Allergic rhinitis    Anxiety    Arthritis    Chronic cervical radiculopathy 02/02/2013   Chronic low back pain 08/12/2010   Depression    Hearing loss    sensorineural  bilateral   Hemorrhoids    HOH (hard of hearing)    Hyperlipidemia    Hyperlipidemia    Lumbago    Psoriasis    Retinal tear    bilateral no surgery   Shingles    Past Surgical History:  Procedure Laterality Date   CATARACT EXTRACTION Left    detatched retina Left    laser surgery   EYE SURGERY     HEMORRHOID SURGERY     Family History  Problem Relation Age of Onset   Cancer Father        lung   Cancer Mother        bladder  Diabetes Other    Social History   Socioeconomic History   Marital status: Divorced    Spouse name: Not on file   Number of children: 2   Years of education: Not on file   Highest education level: Not on file  Occupational History   Occupation: disabled    Employer: RETIRED  Tobacco Use   Smoking status: Never   Smokeless tobacco: Never  Vaping Use   Vaping Use: Never used  Substance and Sexual Activity   Alcohol use: No   Drug use: No   Sexual activity: Never  Other Topics Concern   Not on file  Social History Narrative   Originally from [place], [place]   Social Determinants of Health   Financial Resource Strain: [place]  (03/05/2022)   Overall Financial Resource Strain (CARDIA)    Difficulty of Paying Living  Expenses: Not very hard  Food Insecurity: No Food Insecurity (03/05/2022)   Hunger Vital Sign    Worried About Running Out of Food in the Last Year: Never true    Luis Lopez in the Last Year: Never true  Transportation Needs: No Transportation Needs (03/05/2022)   PRAPARE - Hydrologist (Medical): No    Lack of Transportation (Non-Medical): No  Physical Activity: Inactive (03/05/2022)   Exercise Vital Sign    Days of Exercise per Week: 0 days    Minutes of Exercise per Session: 0 min  Stress: No Stress Concern Present (03/05/2022)   [place]    Feeling of Stress : Only a little  Social Connections: Moderately Isolated (03/05/2022)   Social Connection and Isolation Panel [NHANES]    Frequency of Communication with Friends and Family: More than three times a week    Frequency of Social Gatherings with Friends and Family: Once a week    Attends Religious Services: More than 4 times per year    Active Member of [employer] or Organizations: No    Attends Music therapist: Never    Marital Status: Divorced    Tobacco Counseling Counseling given: Not Answered   Clinical Intake:  Pre-visit preparation completed: Yes  Pain : 0-10 Pain Score: 5  Pain Type: Chronic pain Pain Location: Back Pain Orientation: Lower     Diabetes: No  How often do you need to have someone help you when you read instructions, pamphlets, or other written materials from your doctor or pharmacy?: 1 - Never  Diabetic?no  Interpreter Needed?: No  Information entered by :: Kirke Shaggy, LPN   Activities of Daily Living    03/05/2022   11:16 AM  In your present state of health, do you have any difficulty performing the following activities:  Hearing? 1  Vision? 0  Difficulty concentrating or making decisions? 0  Walking or climbing stairs? 1  Dressing or bathing? 0  Doing errands, shopping?  1  Preparing Food and eating ? N  Using the Toilet? N  In the past six months, have you accidently leaked urine? N  Do you have problems with loss of bowel control? N  Managing your Medications? N  Managing your Finances? N  Housekeeping or managing your Housekeeping? Y    Patient Care Team: Biagio Borg, MD as PCP - Nadara Eaton, MD as Consulting Physician (Orthopedic Surgery) [place], Jennefer Bravo, MD as Referring Physician (Dermatology) Shelor Boykins, Rex Kras, LCSW (Inactive) as Social Worker (Licensed Clinical Social Worker) Research officer, trade union,  Curt Bears, MD as Consulting Physician (Ophthalmology)  Indicate any recent Medical Services you may have received from other than [facility] providers in the past year (date may be approximate).     Assessment:   This is a routine wellness examination for Ricky Kelly.  Hearing/Vision screen Hearing Screening - Comments:: No aids Vision Screening - Comments:: Wears glasses- Dr.Hecker  Dietary issues and exercise activities discussed: Current Exercise Habits: The patient does not participate in regular exercise at present   Goals Addressed             This Visit's Progress    DIET - EAT MORE FRUITS AND VEGETABLES         Depression Screen    03/05/2022   11:11 AM 09/12/2021    3:11 PM 02/08/2021   11:05 AM 10/25/2020    3:50 PM 10/25/2020    3:19 PM 10/20/2019    2:08 PM 10/20/2019    1:42 PM  PHQ 2/9 Scores  PHQ - 2 Score 1 6 1 1 5 $ 0 0  PHQ- 9 Score 3 18 1  15  $ 0    Fall Risk    03/05/2022   11:15 AM 09/12/2021    3:12 PM 03/11/2021    2:46 PM 10/25/2020    3:18 PM 10/20/2019    2:08 PM  Fall Risk   Falls in the past year? 0 0 1 1 0  Number falls in past yr: 0  1 1   Injury with Fall? 0 0 0 0   Risk for fall due to : No Fall Risks  Impaired balance/gait Impaired balance/gait   Follow up Falls prevention discussed;Falls evaluation completed        FALL RISK PREVENTION PERTAINING TO THE HOME:  Any stairs in or around the home?  Yes  If so, are there any without handrails? No  Home free of loose throw rugs in walkways, pet beds, electrical cords, etc? Yes  Adequate lighting in your home to reduce risk of falls? Yes   ASSISTIVE DEVICES UTILIZED TO PREVENT FALLS:  Life alert? No  Use of a cane, walker or w/c? Yes  Grab bars in the bathroom? Yes  Shower chair or bench in shower? No  Elevated toilet seat or a handicapped toilet? No   Cognitive Function:        03/05/2022   11:19 AM  6CIT Screen  What Year? 0 points  What month? 0 points  What time? 0 points  Count back from 20 0 points  Months in reverse 0 points  Repeat phrase 0 points  Total Score 0 points    Immunizations Immunization History  Administered Date(s) Administered   Fluad Quad(high Dose 65+) 10/21/2018, 10/20/2019, 10/25/2020   Influenza Whole 12/03/2007, 10/19/2008, 10/03/2009, 09/25/2010   Influenza, High Dose Seasonal PF 10/07/2017   Influenza,inj,Quad PF,6+ Mos 10/20/2012, 10/13/2013, 10/19/2014, 10/04/2015, 10/16/2016   Moderna Covid-19 Vaccine Bivalent Booster 74yr & up 01/07/2021   Moderna Sars-Covid-2 Vaccination 03/12/2019, 04/09/2019, 11/22/2019   Pneumococcal Conjugate-13 10/21/2018   Pneumococcal Polysaccharide-23 02/15/2014, 10/20/2019   Td 05/10/2008   Tdap 11/17/2018   Zoster Recombinat (Shingrix) 07/21/2016    TDAP status: Up to date  Flu Vaccine status: Up to date  Pneumococcal vaccine status: Up to date  Covid-19 vaccine status: Completed vaccines  Qualifies for Shingles Vaccine? Yes   Zostavax completed No   Shingrix Completed?: No.    Education has been provided regarding the importance of this vaccine. Patient has been advised to call insurance  company to determine out of pocket expense if they have not yet received this vaccine. Advised may also receive vaccine at local pharmacy or Health Dept. Verbalized acceptance and understanding.  Screening Tests Health Maintenance  Topic Date Due    COLONOSCOPY (Pts 45-71yr Insurance coverage will need to be confirmed)  Never done   Zoster Vaccines- Shingrix (2 of 2) 09/15/2016   INFLUENZA VACCINE  08/20/2021   COVID-19 Vaccine (5 - 2023-24 season) 09/20/2021   Medicare Annual Wellness (AWV)  03/06/2023   DTaP/Tdap/Td (3 - Td or Tdap) 11/16/2028   Pneumonia Vaccine 70 Years old  Completed   Hepatitis C Screening  Completed   HPV VACCINES  Aged Out    Health Maintenance  Health Maintenance Due  Topic Date Due   COLONOSCOPY (Pts 45-425yrInsurance coverage will need to be confirmed)  Never done   Zoster Vaccines- Shingrix (2 of 2) 09/15/2016   INFLUENZA VACCINE  08/20/2021   COVID-19 Vaccine (5 - 2023-24 season) 09/20/2021    Declined referral for colonoscopy  Lung Cancer Screening: (Low Dose CT Chest recommended if Age 70-80ears, 30 pack-year currently smoking OR have quit w/in 15years.) does not qualify.   Additional Screening:  Hepatitis C Screening: does qualify; Completed 09/07/15  Vision Screening: Recommended annual ophthalmology exams for early detection of glaucoma and other disorders of the eye. Is the patient up to date with their annual eye exam?  Yes  Who is the provider or what is the name of the office in which the patient attends annual eye exams? Dr.Hecker If pt is not established with a provider, would they like to be referred to a provider to establish care? No .   Dental Screening: Recommended annual dental exams for proper oral hygiene  Community Resource Referral / Chronic Care Management: CRR required this visit?  No   CCM required this visit?  No      Plan:     I have personally reviewed and noted the following in the patient's chart:   Medical and social history Use of alcohol, tobacco or illicit drugs  Current medications and supplements including opioid prescriptions. Patient is not currently taking opioid prescriptions. Functional ability and status Nutritional status Physical  activity Advanced directives List of other physicians Hospitalizations, surgeries, and ER visits in previous 12 months Vitals Screenings to include cognitive, depression, and falls Referrals and appointments  In addition, I have reviewed and discussed with patient certain preventive protocols, quality metrics, and best practice recommendations. A written personalized care plan for preventive services as well as general preventive health recommendations were provided to patient.     LoDionisio DavidLPN   [id number] Nurse Notes: none

## 2022-03-05 NOTE — Patient Instructions (Signed)
Ricky Kelly , Thank you for taking time to come for your Medicare Wellness Visit. I appreciate your ongoing commitment to your health goals. Please review the following plan we discussed and let me know if I can assist you in the future.   These are the goals we discussed:  Goals      DIET - EAT MORE FRUITS AND VEGETABLES     Patient Stated     I want to take a vacation with my children. I will sit down and plan a vacation.        This is a list of the screening recommended for you and due dates:  Health Maintenance  Topic Date Due   Colon Cancer Screening  Never done   Zoster (Shingles) Vaccine (2 of 2) 09/15/2016   Flu Shot  08/20/2021   COVID-19 Vaccine (5 - 2023-24 season) 09/20/2021   Medicare Annual Wellness Visit  03/06/2023   DTaP/Tdap/Td vaccine (3 - Td or Tdap) 11/16/2028   Pneumonia Vaccine  Completed   Hepatitis C Screening: USPSTF Recommendation to screen - Ages 91-79 yo.  Completed   HPV Vaccine  Aged Out    Advanced directives: no  Conditions/risks identified: none  Next appointment: Follow up in one year for your annual wellness visit. 03/09/23 @ 10:00 am by phone  Preventive Care 65 Years and Older, Male  Preventive care refers to lifestyle choices and visits with your health care provider that can promote health and wellness. What does preventive care include? A yearly physical exam. This is also called an annual well check. Dental exams once or twice a year. Routine eye exams. Ask your health care provider how often you should have your eyes checked. Personal lifestyle choices, including: Daily care of your teeth and gums. Regular physical activity. Eating a healthy diet. Avoiding tobacco and drug use. Limiting alcohol use. Practicing safe sex. Taking low doses of aspirin every day. Taking vitamin and mineral supplements as recommended by your health care provider. What happens during an annual well check? The services and screenings done by your  health care provider during your annual well check will depend on your age, overall health, lifestyle risk factors, and family history of disease. Counseling  Your health care provider may ask you questions about your: Alcohol use. Tobacco use. Drug use. Emotional well-being. Home and relationship well-being. Sexual activity. Eating habits. History of falls. Memory and ability to understand (cognition). Work and work Statistician. Screening  You may have the following tests or measurements: Height, weight, and BMI. Blood pressure. Lipid and cholesterol levels. These may be checked every 5 years, or more frequently if you are over 64 years old. Skin check. Lung cancer screening. You may have this screening every year starting at age 60 if you have a 30-pack-year history of smoking and currently smoke or have quit within the past 15 years. Fecal occult blood test (FOBT) of the stool. You may have this test every year starting at age 52. Flexible sigmoidoscopy or colonoscopy. You may have a sigmoidoscopy every 5 years or a colonoscopy every 10 years starting at age 58. Prostate cancer screening. Recommendations will vary depending on your family history and other risks. Hepatitis C blood test. Hepatitis B blood test. Sexually transmitted disease (STD) testing. Diabetes screening. This is done by checking your blood sugar (glucose) after you have not eaten for a while (fasting). You may have this done every 1-3 years. Abdominal aortic aneurysm (AAA) screening. You may need this if you  are a current or former smoker. Osteoporosis. You may be screened starting at age 31 if you are at high risk. Talk with your health care provider about your test results, treatment options, and if necessary, the need for more tests. Vaccines  Your health care provider may recommend certain vaccines, such as: Influenza vaccine. This is recommended every year. Tetanus, diphtheria, and acellular pertussis  (Tdap, Td) vaccine. You may need a Td booster every 10 years. Zoster vaccine. You may need this after age 71. Pneumococcal 13-valent conjugate (PCV13) vaccine. One dose is recommended after age 59. Pneumococcal polysaccharide (PPSV23) vaccine. One dose is recommended after age 25. Talk to your health care provider about which screenings and vaccines you need and how often you need them. This information is not intended to replace advice given to you by your health care provider. Make sure you discuss any questions you have with your health care provider. Document Released: 02/02/2015 Document Revised: 09/26/2015 Document Reviewed: 11/07/2014 Elsevier Interactive Patient Education  2017 [employer] Prevention in the Home Falls can cause injuries. They can happen to people of all ages. There are many things you can do to make your home safe and to help prevent falls. What can I do on the outside of my home? Regularly fix the edges of walkways and driveways and fix any cracks. Remove anything that might make you trip as you walk through a door, such as a raised step or threshold. Trim any bushes or trees on the path to your home. Use bright outdoor lighting. Clear any walking paths of anything that might make someone trip, such as rocks or tools. Regularly check to see if handrails are loose or broken. Make sure that both sides of any steps have handrails. Any raised decks and porches should have guardrails on the edges. Have any leaves, snow, or ice cleared regularly. Use sand or salt on walking paths during winter. Clean up any spills in your garage right away. This includes oil or grease spills. What can I do in the bathroom? Use night lights. Install grab bars by the toilet and in the tub and shower. Do not use towel bars as grab bars. Use non-skid mats or decals in the tub or shower. If you need to sit down in the shower, use a plastic, non-slip stool. Keep the floor dry. Clean  up any water that spills on the floor as soon as it happens. Remove soap buildup in the tub or shower regularly. Attach bath mats securely with double-sided non-slip rug tape. Do not have throw rugs and other things on the floor that can make you trip. What can I do in the bedroom? Use night lights. Make sure that you have a light by your bed that is easy to reach. Do not use any sheets or blankets that are too big for your bed. They should not hang down onto the floor. Have a firm chair that has side arms. You can use this for support while you get dressed. Do not have throw rugs and other things on the floor that can make you trip. What can I do in the kitchen? Clean up any spills right away. Avoid walking on wet floors. Keep items that you use a lot in easy-to-reach places. If you need to reach something above you, use a strong step stool that has a grab bar. Keep electrical cords out of the way. Do not use floor polish or wax that makes floors slippery. If you  must use wax, use non-skid floor wax. Do not have throw rugs and other things on the floor that can make you trip. What can I do with my stairs? Do not leave any items on the stairs. Make sure that there are handrails on both sides of the stairs and use them. Fix handrails that are broken or loose. Make sure that handrails are as long as the stairways. Check any carpeting to make sure that it is firmly attached to the stairs. Fix any carpet that is loose or worn. Avoid having throw rugs at the top or bottom of the stairs. If you do have throw rugs, attach them to the floor with carpet tape. Make sure that you have a light switch at the top of the stairs and the bottom of the stairs. If you do not have them, ask someone to add them for you. What else can I do to help prevent falls? Wear shoes that: Do not have high heels. Have rubber bottoms. Are comfortable and fit you well. Are closed at the toe. Do not wear sandals. If you  use a stepladder: Make sure that it is fully opened. Do not climb a closed stepladder. Make sure that both sides of the stepladder are locked into place. Ask someone to hold it for you, if possible. Clearly mark and make sure that you can see: Any grab bars or handrails. First and last steps. Where the edge of each step is. Use tools that help you move around (mobility aids) if they are needed. These include: Canes. Walkers. Scooters. Crutches. Turn on the lights when you go into a dark area. Replace any light bulbs as soon as they burn out. Set up your furniture so you have a clear path. Avoid moving your furniture around. If any of your floors are uneven, fix them. If there are any pets around you, be aware of where they are. Review your medicines with your doctor. Some medicines can make you feel dizzy. This can increase your chance of falling. Ask your doctor what other things that you can do to help prevent falls. This information is not intended to replace advice given to you by your health care provider. Make sure you discuss any questions you have with your health care provider. Document Released: 11/02/2008 Document Revised: 06/14/2015 Document Reviewed: 02/10/2014 Elsevier Interactive Patient Education  2017 [employer].

## 2022-03-06 ENCOUNTER — Other Ambulatory Visit: Payer: Self-pay | Admitting: Internal Medicine

## 2022-03-12 ENCOUNTER — Ambulatory Visit: Payer: Medicare Other | Admitting: Internal Medicine

## 2022-03-19 ENCOUNTER — Ambulatory Visit (INDEPENDENT_AMBULATORY_CARE_PROVIDER_SITE_OTHER): Payer: Medicare Other | Admitting: Internal Medicine

## 2022-03-19 VITALS — BP 138/72 | HR 75 | Temp 98.3°F | Ht 66.0 in

## 2022-03-19 DIAGNOSIS — F32A Depression, unspecified: Secondary | ICD-10-CM

## 2022-03-19 DIAGNOSIS — E559 Vitamin D deficiency, unspecified: Secondary | ICD-10-CM | POA: Diagnosis not present

## 2022-03-19 DIAGNOSIS — R739 Hyperglycemia, unspecified: Secondary | ICD-10-CM | POA: Diagnosis not present

## 2022-03-19 DIAGNOSIS — E7800 Pure hypercholesterolemia, unspecified: Secondary | ICD-10-CM

## 2022-03-19 NOTE — Progress Notes (Addendum)
Patient ID: Ricky Kelly, male   DOB: March 01, 1950, 72 y.o.   MRN: [id number]        Chief Complaint: follow up  HLD and hyperglycemia ,low vit d, depression       HPI:  Ricky Kelly is a 72 y.o. male here overall doing ok, Pt denies chest pain, increased sob or doe, wheezing, orthopnea, PND, increased LE swelling, palpitations, dizziness or syncope.   Pt denies polydipsia, polyuria, or new focal neuro s/s.    Pt denies fever, wt loss, night sweats, loss of appetite, or other constitutional symptoms  Denies worsening depressive symptoms, suicidal ideation, or panic; has ongoing anxiety, not increased recently.   Pt continues to have recurring LBP without change in severity, bowel or bladder change, fever, wt loss,  worsening LE pain/numbness/weakness, gait change or falls.       Wt Readings from Last 3 Encounters:  03/05/22 170 lb (77.1 kg)  09/12/21 170 lb 3.2 oz (77.2 kg)  03/11/21 169 lb (76.7 kg)   BP Readings from Last 3 Encounters:  03/19/22 138/72  09/12/21 132/88  03/11/21 128/76         Past Medical History:  Diagnosis Date   Adjustment disorder 1/10   with depressive symptoms   Allergic rhinitis    Anxiety    Arthritis    Chronic cervical radiculopathy 02/02/2013   Chronic low back pain 08/12/2010   Depression    Hearing loss    sensorineural  bilateral   Hemorrhoids    HOH (hard of hearing)    Hyperlipidemia    Hyperlipidemia    Lumbago    Psoriasis    Retinal tear    bilateral no surgery   Shingles    Past Surgical History:  Procedure Laterality Date   CATARACT EXTRACTION Left    detatched retina Left    laser surgery   EYE SURGERY     HEMORRHOID SURGERY      reports that he has never smoked. He has never used smokeless tobacco. He reports that he does not drink alcohol and does not use drugs. family history includes Cancer in his father and mother; Diabetes in an other family member. Allergies  Allergen Reactions   Allegra [Fexofenadine Hcl]  Other (See Comments)    constipation   Escitalopram Oxalate     constipation   Naproxen     constipation   Statins Other (See Comments)    constipation   Current Outpatient Medications on File Prior to Visit  Medication Sig Dispense Refill   acetaminophen (TYLENOL) 500 MG tablet Take 500 mg by mouth every 6 (six) hours as needed.     ALPRAZolam (XANAX) 1 MG tablet Take 1 tablet by mouth three times daily as needed 90 tablet 2   Calcitriol 3 MCG/GM cream Apply topically at bedtime.     carboxymethylcellulose (REFRESH PLUS) 0.5 % SOLN 1 drop daily as needed.     Cholecalciferol 50 MCG (2000 UT) TABS 1 tab by mouth once daily 30 tablet 99   clotrimazole-betamethasone (LOTRISONE) cream Apply 1 application topically daily. 30 g 2   dimenhyDRINATE (DRAMAMINE) 50 MG tablet Take 25 mg by mouth daily.     ibuprofen (ADVIL) 800 MG tablet TAKE 1 TABLET BY MOUTH EVERY 8 HOURS AS NEEDED 60 tablet 2   Polyethyl Glycol-Propyl Glycol (SYSTANE OP) Apply to eye 3 (three) times daily.     promethazine (PHENERGAN) 25 MG tablet TAKE 1 TABLET BY MOUTH EVERY 6 HOURS AS  NEEDED FOR NAUSEA AND VOMITING 30 tablet 0   tiZANidine (ZANAFLEX) 4 MG tablet TAKE 1 TABLET BY MOUTH EVERY 6 HOURS AS NEEDED FOR MUSCLE SPASM 60 tablet 0   No current facility-administered medications on file prior to visit.        ROS:  All others reviewed and negative.  Objective        PE:  BP 138/72   Pulse 75   Temp 98.3 F (36.8 C) (Temporal)   Ht '5\' 6"'$  (1.676 m)   SpO2 99%   BMI 27.44 kg/m                 Constitutional: Pt appears in NAD               HENT: Head: NCAT.                Right Ear: External ear normal.                 Left Ear: External ear normal.                Eyes: . Pupils are equal, round, and reactive to light. Conjunctivae and EOM are normal               Nose: without d/c or deformity               Neck: Neck supple. Gross normal ROM               Cardiovascular: Normal rate and regular rhythm.                  Pulmonary/Chest: Effort normal and breath sounds without rales or wheezing.                Abd:  Soft, NT, ND, + BS, no organomegaly               Neurological: Pt is alert. At baseline orientation, motor grossly intact               Skin: Skin is warm. No rashes, no other new lesions, LE edema - none               Psychiatric: Pt behavior is normal without agitation , chronic depressed  Micro: none  Cardiac tracings I have personally interpreted today:  none  Pertinent Radiological findings (summarize): none   Lab Results  Component Value Date   WBC 7.1 09/04/2021   HGB 13.4 09/04/2021   HCT 39.9 09/04/2021   PLT 185.0 09/04/2021   GLUCOSE 92 09/04/2021   CHOL 173 09/04/2021   TRIG 118.0 09/04/2021   HDL 45.80 09/04/2021   LDLDIRECT 100.0 09/18/2016   LDLCALC 104 (H) 09/04/2021   ALT 20 09/04/2021   AST 23 09/04/2021   NA 129 (L) 09/04/2021   K 4.6 09/04/2021   CL 96 09/04/2021   CREATININE 0.87 09/04/2021   BUN 12 09/04/2021   CO2 30 09/04/2021   TSH 2.18 09/04/2021   PSA 0.91 09/04/2021   HGBA1C 5.8 09/04/2021   Assessment/Plan:  Ricky Kelly is a 72 y.o. White or Caucasian [1] male with  has a past medical history of Adjustment disorder (1/10), Allergic rhinitis, Anxiety, Arthritis, Chronic cervical radiculopathy (02/02/2013), Chronic low back pain (08/12/2010), Depression, Hearing loss, Hemorrhoids, HOH (hard of hearing), Hyperlipidemia, Hyperlipidemia, Lumbago, Psoriasis, Retinal tear, and Shingles.  Depression Stable overall, continue current management, declines need for start med tx or referral counseling  Hyperglycemia  Lab Results  Component Value Date   HGBA1C 5.8 09/04/2021   Stable, pt to continue current medical treatment  - diet, wt control   Hyperlipidemia Lab Results  Component Value Date   LDLCALC 104 (H) 09/04/2021   Uncontrolled, goal ldl < 70, pt to continue lower chol diet, declines statin or card CT score for  now   Vitamin D deficiency Last vitamin D Lab Results  Component Value Date   VD25OH 11.12 (L) 09/04/2021   Low, reminded to start oral replacement  Followup: Return in about 6 months (around 09/17/2022).  Cathlean Cower, MD 03/22/2022 5:20 PM [facility]

## 2022-03-19 NOTE — Patient Instructions (Signed)
Please continue all other medications as before, and refills have been done if requested. ° °Please have the pharmacy call with any other refills you may need. ° °Please continue your efforts at being more active, low cholesterol diet, and weight control. ° °You are otherwise up to date with prevention measures today. ° °Please keep your appointments with your specialists as you may have planned ° °Please make an Appointment to return in 6 months, or sooner if needed °

## 2022-03-22 ENCOUNTER — Encounter: Payer: Self-pay | Admitting: Internal Medicine

## 2022-03-22 NOTE — Assessment & Plan Note (Signed)
Last vitamin D Lab Results  Component Value Date   VD25OH 11.12 (L) 09/04/2021   Low, reminded to start oral replacement

## 2022-03-22 NOTE — Assessment & Plan Note (Signed)
Lab Results  Component Value Date   LDLCALC 104 (H) 09/04/2021   Uncontrolled, goal ldl < 70, pt to continue lower chol diet, declines statin or card CT score for now

## 2022-03-22 NOTE — Assessment & Plan Note (Signed)
Lab Results  Component Value Date   HGBA1C 5.8 09/04/2021   Stable, pt to continue current medical treatment  - diet, wt control

## 2022-03-22 NOTE — Assessment & Plan Note (Signed)
Stable overall, continue current management, declines need for start med tx or referral counseling

## 2022-03-27 ENCOUNTER — Ambulatory Visit: Payer: Medicare Other | Admitting: Psychology

## 2022-04-09 ENCOUNTER — Other Ambulatory Visit: Payer: Self-pay | Admitting: Internal Medicine

## 2022-04-24 ENCOUNTER — Ambulatory Visit: Payer: Medicare Other | Admitting: Psychology

## 2022-05-22 ENCOUNTER — Other Ambulatory Visit: Payer: Self-pay | Admitting: Internal Medicine

## 2022-05-27 ENCOUNTER — Telehealth: Payer: Self-pay | Admitting: Internal Medicine

## 2022-05-27 MED ORDER — PROMETHAZINE HCL 25 MG PO TABS: 25.0000 mg | ORAL_TABLET | Freq: Four times a day (QID) | ORAL | 0 refills | Status: DC | PRN

## 2022-05-27 NOTE — Telephone Encounter (Signed)
Refill was sent to [email or web address] [email or web address] Chute

## 2022-05-27 NOTE — Telephone Encounter (Signed)
Prescription Request  05/27/2022  LOV: 03/19/2022  What is the name of the medication or equipment? promethazine (PHENERGAN) 25 MG tablet   Have you contacted your pharmacy to request a refill? Yes   Which pharmacy would you like this sent to?  [facility] [address] ([address]  - [address] [place]) [place] [address] Phone: [phone] Fax: [phone]    Patient notified that their request is being sent to the clinical staff for review and that they should receive a response within 2 business days.   Please advise at [facility] [phone]

## 2022-08-26 ENCOUNTER — Telehealth: Payer: Self-pay | Admitting: Internal Medicine

## 2022-08-26 NOTE — Telephone Encounter (Signed)
Patient informed. Advised that he check when [facility] on where to go specifically.

## 2022-08-26 NOTE — Telephone Encounter (Signed)
Oh no sorry, we do not normally order lab testing for other outside the office physycians, sorry

## 2022-08-26 NOTE — Telephone Encounter (Signed)
Patient called stating that Dr Zenaida Niece at [facility] would like for him to have some labs done. He would like to have them done here at our office but would need Dr Jonny Ruiz to put in the orders for him to be able to do so.  Would Dr Jonny Ruiz be willing to do that? Lab Tests: ESR 2H Westergren Method (Bld Vel) C Reactive protein Stemphylium Botryosum IgG4 (S) (Mass\Vol) Free T4 and TSH Panel  Diagnosis Code: H05.20  Please advise.

## 2022-09-17 ENCOUNTER — Encounter: Payer: Self-pay | Admitting: Internal Medicine

## 2022-09-17 ENCOUNTER — Ambulatory Visit (INDEPENDENT_AMBULATORY_CARE_PROVIDER_SITE_OTHER): Payer: Medicare Other | Admitting: Internal Medicine

## 2022-09-17 VITALS — BP 132/82 | HR 77 | Temp 98.2°F | Ht 66.0 in | Wt 170.0 lb

## 2022-09-17 DIAGNOSIS — F411 Generalized anxiety disorder: Secondary | ICD-10-CM

## 2022-09-17 DIAGNOSIS — E538 Deficiency of other specified B group vitamins: Secondary | ICD-10-CM

## 2022-09-17 DIAGNOSIS — N320 Bladder-neck obstruction: Secondary | ICD-10-CM

## 2022-09-17 DIAGNOSIS — E7800 Pure hypercholesterolemia, unspecified: Secondary | ICD-10-CM

## 2022-09-17 DIAGNOSIS — R030 Elevated blood-pressure reading, without diagnosis of hypertension: Secondary | ICD-10-CM

## 2022-09-17 DIAGNOSIS — Z125 Encounter for screening for malignant neoplasm of prostate: Secondary | ICD-10-CM

## 2022-09-17 DIAGNOSIS — R5383 Other fatigue: Secondary | ICD-10-CM | POA: Diagnosis not present

## 2022-09-17 DIAGNOSIS — E559 Vitamin D deficiency, unspecified: Secondary | ICD-10-CM | POA: Diagnosis not present

## 2022-09-17 DIAGNOSIS — R739 Hyperglycemia, unspecified: Secondary | ICD-10-CM | POA: Diagnosis not present

## 2022-09-17 MED ORDER — PROMETHAZINE HCL 25 MG PO TABS: 25.0000 mg | ORAL_TABLET | Freq: Four times a day (QID) | ORAL | 5 refills | Status: DC | PRN

## 2022-09-17 MED ORDER — ALPRAZOLAM 1 MG PO TABS: ORAL_TABLET | ORAL | 5 refills | Status: DC

## 2022-09-17 MED ORDER — TIZANIDINE HCL 4 MG PO TABS: 4.0000 mg | ORAL_TABLET | Freq: Four times a day (QID) | ORAL | 2 refills | Status: AC | PRN

## 2022-09-17 NOTE — Progress Notes (Unsigned)
Patient ID: Ricky Kelly, male   DOB: September 24, 1950, 72 y.o.   MRN: [id number]        Chief Complaint: follow up HTN, HLD and hyperglycemia , low vit d, fatigue, anxiety       HPI:  Ricky Kelly is a 72 y.o. male here overall doing about the same; Pt denies chest pain, increased sob or doe, wheezing, orthopnea, PND, increased LE swelling, palpitations, dizziness or syncope.   Pt denies polydipsia, polyuria, or new focal neuro s/s.    Pt denies fever, wt loss, night sweats, loss of appetite, or other constitutional symptoms  Pt continues to have recurring LBP without change in severity, bowel or bladder change, fever, wt loss,  worsening LE pain/numbness/weakness, gait change or falls.  Does c/o ongoing fatigue, but denies signficant daytime hypersomnolence.        Wt Readings from Last 3 Encounters:  09/17/22 170 lb (77.1 kg)  03/05/22 170 lb (77.1 kg)  09/12/21 170 lb 3.2 oz (77.2 kg)   BP Readings from Last 3 Encounters:  09/17/22 132/82  03/19/22 138/72  09/12/21 132/88         Past Medical History:  Diagnosis Date   Adjustment disorder 1/10   with depressive symptoms   Allergic rhinitis    Anxiety    Arthritis    Chronic cervical radiculopathy 02/02/2013   Chronic low back pain 08/12/2010   Depression    Hearing loss    sensorineural  bilateral   Hemorrhoids    HOH (hard of hearing)    Hyperlipidemia    Hyperlipidemia    Lumbago    Psoriasis    Retinal tear    bilateral no surgery   Shingles    Past Surgical History:  Procedure Laterality Date   CATARACT EXTRACTION Left    detatched retina Left    laser surgery   EYE SURGERY     HEMORRHOID SURGERY      reports that he has never smoked. He has never used smokeless tobacco. He reports that he does not drink alcohol and does not use drugs. family history includes Cancer in his father and mother; Diabetes in an other family member. Allergies  Allergen Reactions   Allegra [Fexofenadine Hcl] Other (See  Comments)    constipation   Escitalopram Oxalate     constipation   Naproxen     constipation   Statins Other (See Comments)    constipation   Current Outpatient Medications on File Prior to Visit  Medication Sig Dispense Refill   acetaminophen (TYLENOL) 500 MG tablet Take 500 mg by mouth every 6 (six) hours as needed.     Calcitriol 3 MCG/GM cream Apply topically at bedtime.     carboxymethylcellulose (REFRESH PLUS) 0.5 % SOLN 1 drop daily as needed.     Cholecalciferol 50 MCG (2000 UT) TABS 1 tab by mouth once daily 30 tablet 99   clotrimazole-betamethasone (LOTRISONE) cream Apply 1 application topically daily. 30 g 2   dimenhyDRINATE (DRAMAMINE) 50 MG tablet Take 25 mg by mouth daily.     ibuprofen (ADVIL) 800 MG tablet TAKE 1 TABLET BY MOUTH EVERY 8 HOURS AS NEEDED 60 tablet 2   Polyethyl Glycol-Propyl Glycol (SYSTANE OP) Apply to eye 3 (three) times daily.     No current facility-administered medications on file prior to visit.        ROS:  All others reviewed and negative.  Objective        PE:  BP  132/82 (BP Location: Left Arm, Patient Position: Sitting, Cuff Size: Normal)   Pulse 77   Temp 98.2 F (36.8 C) (Oral)   Ht 5\' 6"  (1.676 m)   Wt 170 lb (77.1 kg)   SpO2 99%   BMI 27.44 kg/m                 Constitutional: Pt appears in NAD               HENT: Head: NCAT.                Right Ear: External ear normal.                 Left Ear: External ear normal.                Eyes: . Pupils are equal, round, and reactive to light. Conjunctivae and EOM are normal               Nose: without d/c or deformity               Neck: Neck supple. Gross normal ROM               Cardiovascular: Normal rate and regular rhythm.                 Pulmonary/Chest: Effort normal and breath sounds without rales or wheezing.                Abd:  Soft, NT, ND, + BS, no organomegaly               Neurological: Pt is alert. At baseline orientation, motor grossly intact               Skin:  Skin is warm. No rashes, no other new lesions, LE edema - none               Psychiatric: Pt behavior is normal without agitation   Micro: none  Cardiac tracings I have personally interpreted today:  none  Pertinent Radiological findings (summarize): none   Lab Results  Component Value Date   WBC 7.1 09/04/2021   HGB 13.4 09/04/2021   HCT 39.9 09/04/2021   PLT 185.0 09/04/2021   GLUCOSE 92 09/04/2021   CHOL 173 09/04/2021   TRIG 118.0 09/04/2021   HDL 45.80 09/04/2021   LDLDIRECT 100.0 09/18/2016   LDLCALC 104 (H) 09/04/2021   ALT 20 09/04/2021   AST 23 09/04/2021   NA 129 (L) 09/04/2021   K 4.6 09/04/2021   CL 96 09/04/2021   CREATININE 0.87 09/04/2021   BUN 12 09/04/2021   CO2 30 09/04/2021   TSH 2.18 09/04/2021   PSA 0.91 09/04/2021   HGBA1C 5.8 09/04/2021   Assessment/Plan:  Ricky Kelly is a 72 y.o. White or Caucasian [1] male with  has a past medical history of Adjustment disorder (1/10), Allergic rhinitis, Anxiety, Arthritis, Chronic cervical radiculopathy (02/02/2013), Chronic low back pain (08/12/2010), Depression, Hearing loss, Hemorrhoids, HOH (hard of hearing), Hyperlipidemia, Hyperlipidemia, Lumbago, Psoriasis, Retinal tear, and Shingles.  Anxiety state Stable overall, cont xanax prn with refill  Fatigue Etiology unclear, Exam otherwise benign, to check labs as documented, follow with expectant management  Blood pressure elevated without history of HTN BP Readings from Last 3 Encounters:  09/17/22 132/82  03/19/22 138/72  09/12/21 132/88   Stable, pt to continue medical treatment - diet, wt control   Hyperglycemia Lab Results  Component Value Date  HGBA1C 5.8 09/04/2021   Stable, pt to continue current medical treatment  - diet, wt control   Hyperlipidemia Lab Results  Component Value Date   LDLCALC 104 (H) 09/04/2021   Uncontrolled, goal ldl < 100, pt to continue with lower chol diet, decliens statin   Vitamin D deficiency Last  vitamin D Lab Results  Component Value Date   VD25OH 11.12 (L) 09/04/2021   Low, to start oral replacement  Followup: Return in about 6 months (around 03/20/2023).  Oliver Barre, MD 09/18/2022 8:50 PM [facility]

## 2022-09-17 NOTE — Patient Instructions (Signed)
Please continue all other medications as before, and refills have been done if requested.  Please have the pharmacy call with any other refills you may need.  Please continue your efforts at being more active, low cholesterol diet, and weight control.  You are otherwise up to date with prevention measures today.  Please keep your appointments with your specialists as you may have planned  Please go to the LAB at the blood drawing area for the tests to be done  You will be contacted by phone if any changes need to be made immediately.  Otherwise, you will receive a letter about your results with an explanation, but please check with MyChart first.  Please remember to sign up for MyChart if you have not done so, as this will be important to you in the future with finding out test results, communicating by private email, and scheduling acute appointments online when needed.  Please make an Appointment to return in 6 months, or sooner if needed 

## 2022-09-18 ENCOUNTER — Encounter: Payer: Self-pay | Admitting: Internal Medicine

## 2022-09-18 NOTE — Assessment & Plan Note (Signed)
Lab Results  Component Value Date   HGBA1C 5.8 09/04/2021   Stable, pt to continue current medical treatment  - diet, wt control

## 2022-09-18 NOTE — Assessment & Plan Note (Signed)
BP Readings from Last 3 Encounters:  09/17/22 132/82  03/19/22 138/72  09/12/21 132/88   Stable, pt to continue medical treatment - diet, wt control

## 2022-09-18 NOTE — Assessment & Plan Note (Signed)
Stable overall, cont xanax prn with refill

## 2022-09-18 NOTE — Assessment & Plan Note (Signed)
Last vitamin D Lab Results  Component Value Date   VD25OH 11.12 (L) 09/04/2021   Low, to start oral replacement

## 2022-09-18 NOTE — Assessment & Plan Note (Signed)
Etiology unclear, Exam otherwise benign, to check labs as documented, follow with expectant management  

## 2022-09-18 NOTE — Assessment & Plan Note (Signed)
Lab Results  Component Value Date   LDLCALC 104 (H) 09/04/2021   Uncontrolled, goal ldl < 100, pt to continue with lower chol diet, decliens statin

## 2023-02-18 ENCOUNTER — Telehealth: Payer: Self-pay | Admitting: Internal Medicine

## 2023-02-18 DIAGNOSIS — H903 Sensorineural hearing loss, bilateral: Secondary | ICD-10-CM

## 2023-02-18 NOTE — Telephone Encounter (Unsigned)
Copied from CRM [id number]. Topic: Referral - Request for Referral >> Feb 18, 2023 11:11 AM Sonny Dandy B wrote: Did the patient discuss referral with their provider in the last year? Yes (If No - schedule appointment) (If Yes - send message)  Appointment offered? No  Type of order/referral and detailed reason for visit: Audiologist   Preference of office, provider, location: [facility]  If referral order, have you been seen by this specialty before? Yes (If Yes, this issue or another issue? When? Where? 7 years ago. Pt does not remember the place   Can we respond through MyChart? Yes

## 2023-02-19 NOTE — Telephone Encounter (Signed)
Ok referral done

## 2023-03-09 ENCOUNTER — Telehealth: Payer: Self-pay

## 2023-03-09 ENCOUNTER — Ambulatory Visit (INDEPENDENT_AMBULATORY_CARE_PROVIDER_SITE_OTHER): Payer: Medicare Other

## 2023-03-09 VITALS — Ht 66.0 in | Wt 176.0 lb

## 2023-03-09 DIAGNOSIS — Z0000 Encounter for general adult medical examination without abnormal findings: Secondary | ICD-10-CM | POA: Diagnosis not present

## 2023-03-09 DIAGNOSIS — F32A Depression, unspecified: Secondary | ICD-10-CM

## 2023-03-09 NOTE — Progress Notes (Signed)
Subjective:   Ricky Kelly is a 74 y.o. male who presents for Medicare Annual/Subsequent preventive examination.  Visit Complete: Virtual I connected with  Ricky Kelly on 03/09/23 by a audio enabled telemedicine application and verified that I am speaking with the correct person using two identifiers.  Patient Location: Home  Provider Location: Home Office  I discussed the limitations of evaluation and management by telemedicine. The patient expressed understanding and agreed to proceed.  Vital Signs: Because this visit was a virtual/telehealth visit, some criteria may be missing or patient reported. Any vitals not documented were not able to be obtained and vitals that have been documented are patient reported.   Cardiac Risk Factors include: advanced age (>71men, >77 women);dyslipidemia;male gender     Objective:    Today's Vitals   03/09/23 1009  Weight: 176 lb (79.8 kg)  Height: 5\' 6"  (1.676 m)  PainSc: 5    Body mass index is 28.41 kg/m.     03/09/2023   10:22 AM 03/05/2022   11:15 AM 09/30/2018   12:07 PM 03/20/2017    5:01 PM 10/03/2015    2:24 PM  Advanced Directives  Does Patient Have a Medical Advance Directive? Yes No Yes No No  Type of Estate agent of [place];Living will  Healthcare Power of [place];Living will    Copy of Healthcare Power of Attorney in Chart? No - copy requested  No - copy requested    Would patient like information on creating a medical advance directive?  No - Patient declined  Yes (ED - Information included in AVS) No - patient declined information    Current Medications (verified) Outpatient Encounter Medications as of 03/09/2023  Medication Sig   acetaminophen (TYLENOL) 500 MG tablet Take 500 mg by mouth every 6 (six) hours as needed.   ALPRAZolam (XANAX) 1 MG tablet Take 1 tablet by mouth three times daily as needed   Calcitriol 3 MCG/GM cream Apply topically at bedtime.   carboxymethylcellulose  (REFRESH PLUS) 0.5 % SOLN 1 drop daily as needed.   Cholecalciferol 50 MCG (2000 UT) TABS 1 tab by mouth once daily   clotrimazole-betamethasone (LOTRISONE) cream Apply 1 application topically daily.   dimenhyDRINATE (DRAMAMINE) 50 MG tablet Take 25 mg by mouth daily.   ibuprofen (ADVIL) 800 MG tablet TAKE 1 TABLET BY MOUTH EVERY 8 HOURS AS NEEDED   Polyethyl Glycol-Propyl Glycol (SYSTANE OP) Apply to eye 3 (three) times daily.   promethazine (PHENERGAN) 25 MG tablet Take 1 tablet (25 mg total) by mouth every 6 (six) hours as needed for nausea or vomiting.   tiZANidine (ZANAFLEX) 4 MG tablet Take 1 tablet (4 mg total) by mouth every 6 (six) hours as needed for muscle spasms.   No facility-administered encounter medications on file as of 03/09/2023.    Allergies (verified) Allegra [fexofenadine hcl], Escitalopram oxalate, Naproxen, and Statins   History: Past Medical History:  Diagnosis Date   Adjustment disorder 1/10   with depressive symptoms   Allergic rhinitis    Anxiety    Arthritis    Chronic cervical radiculopathy 02/02/2013   Chronic low back pain 08/12/2010   Depression    Hearing loss    sensorineural  bilateral   Hemorrhoids    HOH (hard of hearing)    Hyperlipidemia    Hyperlipidemia    Lumbago    Psoriasis    Retinal tear    bilateral no surgery   Shingles    Past Surgical History:  Procedure Laterality Date   CATARACT EXTRACTION Left    detatched retina Left    laser surgery   EYE SURGERY     HEMORRHOID SURGERY     Family History  Problem Relation Age of Onset   Cancer Father        lung   Cancer Mother        bladder   Diabetes Other    Social History   Socioeconomic History   Marital status: Divorced    Spouse name: Not on file   Number of children: 2   Years of education: Not on file   Highest education level: Not on file  Occupational History   Occupation: disabled    Employer: RETIRED  Tobacco Use   Smoking status: Never   Smokeless  tobacco: Never  Vaping Use   Vaping status: Never Used  Substance and Sexual Activity   Alcohol use: No   Drug use: No   Sexual activity: Never  Other Topics Concern   Not on file  Social History Narrative   Originally from [place], [place]   Lives alone.   Social Drivers of Corporate investment banker Strain: High Risk (03/09/2023)   Overall Financial Resource Strain (CARDIA)    Difficulty of Paying Living Expenses: Very hard  Food Insecurity: No Food Insecurity (03/09/2023)   Hunger Vital Sign    Worried About Running Out of Food in the Last Year: Never true    Ran Out of Food in the Last Year: Never true  Transportation Needs: No Transportation Needs (03/09/2023)   PRAPARE - Administrator, Civil Service (Medical): No    Lack of Transportation (Non-Medical): No  Physical Activity: Inactive (03/05/2022)   Exercise Vital Sign    Days of Exercise per Week: 0 days    Minutes of Exercise per Session: 0 min  Stress: Stress Concern Present (03/09/2023)   [employer] of Occupational Health - Occupational Stress Questionnaire    Feeling of Stress : Rather much  Social Connections: Moderately Isolated (03/09/2023)   Social Connection and Isolation Panel [NHANES]    Frequency of Communication with Friends and Family: Three times a week    Frequency of Social Gatherings with Friends and Family: Never    Attends Religious Services: More than 4 times per year    Active Member of [employer] or Organizations: No    Attends Engineer, structural: Never    Marital Status: Divorced    Tobacco Counseling Counseling given: Not Answered   Clinical Intake:     Pain : 0-10 Pain Score: 5  Pain Type: Chronic pain (back,) Pain Location: Back (hands,) Pain Descriptors / Indicators: Aching, Discomfort Pain Onset: More than a month ago Pain Frequency: Constant     BMI - recorded: 28.41 Nutritional Status: BMI 25 -29 Overweight Nutritional Risks: None Diabetes: No  How  often do you need to have someone help you when you read instructions, pamphlets, or other written materials from your doctor or pharmacy?: 1 - Never  Interpreter Needed?: No  Information entered by :: Ricky Kelly, RMA   Activities of Daily Living    03/09/2023   10:11 AM  In your present state of health, do you have any difficulty performing the following activities:  Hearing? 1  Comment Hearing loss sensory, bilateral  Vision? 1  Difficulty concentrating or making decisions? 0  Walking or climbing stairs? 1  Dressing or bathing? 0  Doing errands, shopping? 0  Preparing Food  and eating ? N  Using the Toilet? N  In the past six months, have you accidently leaked urine? N  Do you have problems with loss of bowel control? N  Managing your Medications? N  Managing your Finances? N  Housekeeping or managing your Housekeeping? N    Patient Care Team: Corwin Levins, MD as PCP - Ottis Stain, MD as Consulting Physician (Orthopedic Surgery) [place], Elvin So, MD as Referring Physician (Dermatology) Shelor Earnest Conroy, Minerva Fester, LCSW (Inactive) as Social Worker (Licensed Clinical Social Worker) Mateo Flow, MD as Consulting Physician (Ophthalmology) Stephannie Li, MD as Consulting Physician (Ophthalmology)  Indicate any recent Medical Services you may have received from other than [facility] providers in the past year (date may be approximate).     Assessment:   This is a routine wellness examination for Ricky Kelly.  Hearing/Vision screen Hearing Screening - Comments:: Hearing loss sensory, bilateral Vision Screening - Comments:: Wears eyeglasses   Goals Addressed             This Visit's Progress    DIET - EAT MORE FRUITS AND VEGETABLES   On track     Depression Screen    03/09/2023   10:28 AM 09/17/2022    2:50 PM 03/05/2022   11:11 AM 09/12/2021    3:11 PM 02/08/2021   11:05 AM 10/25/2020    3:50 PM 10/25/2020    3:19 PM  PHQ 2/9 Scores  PHQ - 2 Score 3 6 1  6 1 1 5   PHQ- 9 Score 10 12 3 18 1  15     Fall Risk    03/09/2023   10:23 AM 09/17/2022    2:50 PM 03/19/2022    2:45 PM 03/05/2022   11:15 AM 09/12/2021    3:12 PM  Fall Risk   Falls in the past year? 1 1 0 0 0  Number falls in past yr: 1 0 0 0   Comment fell in shower/outside      Injury with Fall? 0 0 0 0 0  Risk for fall due to :  History of fall(s) Impaired balance/gait No Fall Risks   Risk for fall due to: Comment   cane    Follow up Falls evaluation completed;Falls prevention discussed Falls evaluation completed Falls evaluation completed Falls prevention discussed;Falls evaluation completed     MEDICARE RISK AT HOME: Medicare Risk at Home Any stairs in or around the home?: No Home free of loose throw rugs in walkways, pet beds, electrical cords, etc?: Yes Adequate lighting in your home to reduce risk of falls?: Yes Life alert?: No Use of a cane, walker or w/c?: Yes (cane) Grab bars in the bathroom?: Yes Shower chair or bench in shower?: Yes Elevated toilet seat or a handicapped toilet?: Yes  TIMED UP AND GO:  Was the test performed?  No    Cognitive Function:        03/09/2023   10:10 AM 03/05/2022   11:19 AM  6CIT Screen  What Year? 0 points 0 points  What month? 0 points 0 points  What time? 0 points 0 points  Count back from 20 0 points 0 points  Months in reverse 0 points 0 points  Repeat phrase 0 points 0 points  Total Score 0 points 0 points    Immunizations Immunization History  Administered Date(s) Administered   Fluad Quad(high Dose 65+) 10/21/2018, 10/20/2019, 10/25/2020   Influenza Whole 12/03/2007, 10/19/2008, 10/03/2009, 09/25/2010   Influenza, High Dose Seasonal PF 10/07/2017  Influenza,inj,Quad PF,6+ Mos 10/20/2012, 10/13/2013, 10/19/2014, 10/04/2015, 10/16/2016   Influenza-Unspecified 10/28/2021, 10/21/2022   Moderna Covid-19 Fall Seasonal Vaccine 78yrs & older 11/18/2022   Moderna Covid-19 Vaccine Bivalent Booster 77yrs & up 01/07/2021    Moderna Sars-Covid-2 Vaccination 03/12/2019, 04/09/2019, 11/22/2019   Pneumococcal Conjugate-13 10/21/2018   Pneumococcal Polysaccharide-23 02/15/2014, 10/20/2019   Td 05/10/2008   Tdap 11/17/2018   Zoster Recombinant(Shingrix) 07/21/2016, 10/06/2016    TDAP status: Up to date  Flu Vaccine status: Up to date  Pneumococcal vaccine status: Up to date  Covid-19 vaccine status: Completed vaccines  Qualifies for Shingles Vaccine? Yes   Zostavax completed Yes   Shingrix Completed?: Yes  Screening Tests Health Maintenance  Topic Date Due   Colonoscopy  03/20/2023 (Originally 09/04/1997)   Medicare Annual Wellness (AWV)  03/08/2024   DTaP/Tdap/Td (3 - Td or Tdap) 11/16/2028   Pneumonia Vaccine 38+ Years old  Completed   INFLUENZA VACCINE  Completed   COVID-19 Vaccine  Completed   Hepatitis C Screening  Completed   Zoster Vaccines- Shingrix  Completed   HPV VACCINES  Aged Out    Health Maintenance  There are no preventive care reminders to display for this patient.   Lung Cancer Screening: (Low Dose CT Chest recommended if Age 40-80 years, 20 pack-year currently smoking OR have quit w/in 15years.) does not qualify.   Lung Cancer Screening Referral: N/A  Additional Screening:  Hepatitis C Screening: does qualify; Completed 09/07/2015  Vision Screening: Recommended annual ophthalmology exams for early detection of glaucoma and other disorders of the eye. Is the patient up to date with their annual eye exam?  Yes  Who is the provider or what is the name of the office in which the patient attends annual eye exams? [facility]/ Dr. Allyne Gee If pt is not established with a provider, would they like to be referred to a provider to establish care? No .   Dental Screening: Recommended annual dental exams for proper oral hygiene   Community Resource Referral / Chronic Care Management: CRR required this visit?  No   CCM required this visit?  No     Plan:     I have personally  reviewed and noted the following in the patient's chart:   Medical and social history Use of alcohol, tobacco or illicit drugs  Current medications and supplements including opioid prescriptions. Patient is not currently taking opioid prescriptions. Functional ability and status Nutritional status Physical activity Advanced directives List of other physicians Hospitalizations, surgeries, and ER visits in previous 12 months Vitals Screenings to include cognitive, depression, and falls Referrals and appointments  In addition, I have reviewed and discussed with patient certain preventive protocols, quality metrics, and best practice recommendations. A written personalized care plan for preventive services as well as general preventive health recommendations were provided to patient.     Nanette Wirsing L Michal Strzelecki, CMA   03/09/2023   After Visit Summary: (Mail) Due to this being a telephonic visit, the after visit summary with patients personalized plan was offered to patient via mail   Nurse Notes: Patient is due for a colonoscopy, however he declines it today.  Patient is concerned about his chronic pain and his mental health stage.  Patient is requesting a referral to speak with a therapist/psychiatrist for depression IN PERSON ONLY,  however he would like to see sooner than later.  Patient is really persistent on getting some help for his mental stage which is close to suicidal-per pt.  He stated that if it  was not for his religion that he would have been committed suicide due to his chronic pain.  I have sent a telephone encounter to Dr. Alvy Bimler today.  Patient has an up coming appt with Dr. Alvy Bimler on  03/18/23.

## 2023-03-09 NOTE — Telephone Encounter (Signed)
Ok we have a new provider at this office - I will refer urgent

## 2023-03-09 NOTE — Patient Instructions (Signed)
Ricky Kelly , Thank you for taking time to come for your Medicare Wellness Visit. I appreciate your ongoing commitment to your health goals. Please review the following plan we discussed and let me know if I can assist you in the future.   Referrals/Orders/Follow-Ups/Clinician Recommendations: It was nice talking to you today.  I have sent a telephone message to Dr. Jonny Ruiz about a referral.    This is a list of the screening recommended for you and due dates:  Health Maintenance  Topic Date Due   Medicare Annual Wellness Visit  03/06/2023   Colon Cancer Screening  03/20/2023*   Flu Shot  04/20/2023*   DTaP/Tdap/Td vaccine (3 - Td or Tdap) 11/16/2028   Pneumonia Vaccine  Completed   COVID-19 Vaccine  Completed   Hepatitis C Screening  Completed   Zoster (Shingles) Vaccine  Completed   HPV Vaccine  Aged Out  *Topic was postponed. The date shown is not the original due date.    Advanced directives: (Declined) Advance directive discussed with you today. Even though you declined this today, please call our office should you change your mind, and we can give you the proper paperwork for you to fill out.  Next Medicare Annual Wellness Visit scheduled for next year: Yes

## 2023-03-09 NOTE — Telephone Encounter (Signed)
Patient is concerned about his chronic pain and his mental health stage.  Patient is requesting a referral to speak with a therapist/psychiatrist for depression IN PERSON ONLY,  however he would like to see sooner than later.  Patient is really persistent on getting some help for his mental stage which is close to suicidal-per pt.  He stated that if it was not for his religion that he would have been committed suicide due to his chronic pain.  I have sent a telephone encounter to Dr. Alvy Bimler today.  Patient has an up coming appt with Dr. Alvy Bimler on  03/18/23.

## 2023-03-18 ENCOUNTER — Ambulatory Visit (INDEPENDENT_AMBULATORY_CARE_PROVIDER_SITE_OTHER): Payer: Medicare Other | Admitting: Internal Medicine

## 2023-03-18 ENCOUNTER — Encounter: Payer: Self-pay | Admitting: Internal Medicine

## 2023-03-18 VITALS — BP 140/82 | HR 82 | Temp 97.5°F | Ht 66.0 in | Wt 170.0 lb

## 2023-03-18 DIAGNOSIS — R739 Hyperglycemia, unspecified: Secondary | ICD-10-CM | POA: Diagnosis not present

## 2023-03-18 DIAGNOSIS — G894 Chronic pain syndrome: Secondary | ICD-10-CM

## 2023-03-18 DIAGNOSIS — F411 Generalized anxiety disorder: Secondary | ICD-10-CM

## 2023-03-18 DIAGNOSIS — F32A Depression, unspecified: Secondary | ICD-10-CM | POA: Diagnosis not present

## 2023-03-18 DIAGNOSIS — E7800 Pure hypercholesterolemia, unspecified: Secondary | ICD-10-CM

## 2023-03-18 DIAGNOSIS — E559 Vitamin D deficiency, unspecified: Secondary | ICD-10-CM | POA: Diagnosis not present

## 2023-03-18 DIAGNOSIS — L710 Perioral dermatitis: Secondary | ICD-10-CM | POA: Insufficient documentation

## 2023-03-18 NOTE — Patient Instructions (Addendum)
 Please ask at the first floor desk about the referral to the new Psychologist counselor - Morrison Old  Please continue all other medications as before, and refills have been done if requested.  Please have the pharmacy call with any other refills you may need.  Please continue your efforts at being more active, low cholesterol diet, and weight control.  You are otherwise up to date with prevention measures today.  Please keep your appointments with your specialists as you may have planned  Please go to the LAB at the blood drawing area for the tests to be done - today if possible or soon  You will be contacted by phone if any changes need to be made immediately.  Otherwise, you will receive a letter about your results with an explanation, but please check with MyChart first.  Please make an Appointment to return in 6 months, or sooner if needed

## 2023-03-18 NOTE — Progress Notes (Signed)
 Patient ID: Ricky Kelly, male   DOB: [birth year], 73 y.o.   MRN: [id number]         Chief Complaint:: yearly exam       HPI:  Ricky Kelly is a 73 y.o. male here overall doing ok,  Pt denies chest pain, increased sob or doe, wheezing, orthopnea, PND, increased LE swelling, palpitations, dizziness or syncope.   Pt denies polydipsia, polyuria, or new focal neuro s/s.    Pt denies fever, wt loss, night sweats, loss of appetite, or other constitutional symptoms     Has appt soon for hearing testing.  Has ongoing chronic depression and pain, but also since oct 2024 has had significant family stress on top of that new for him, and referred locally to [facility], was sent paperwork packet of about multiple pages of legaleze he could not understand and did not comfortable with signing as he did not understand; did call and try to get help over the phone, but was told he needed an appt in person to do this, even before a therapist appt 3 wks later.  Felt put off, and has not yet done all this, but might.   Wt Readings from Last 3 Encounters:  03/18/23 170 lb (77.1 kg)  03/09/23 176 lb (79.8 kg)  09/17/22 170 lb (77.1 kg)   BP Readings from Last 3 Encounters:  03/18/23 (!) 140/82  09/17/22 132/82  03/19/22 138/72   Immunization History  Administered Date(s) Administered   Fluad Quad(high Dose 65+) 10/21/2018, 10/20/2019, 10/25/2020   Influenza Whole 12/03/2007, 10/19/2008, 10/03/2009, 09/25/2010   Influenza, High Dose Seasonal PF 10/07/2017   Influenza,inj,Quad PF,6+ Mos 10/20/2012, 10/13/2013, 10/19/2014, 10/04/2015, 10/16/2016   Influenza-Unspecified 10/28/2021, 10/21/2022   Moderna Covid-19 Fall Seasonal Vaccine 32yrs & older 11/18/2022   Moderna Covid-19 Vaccine Bivalent Booster 55yrs & up 01/07/2021   Moderna Sars-Covid-2 Vaccination 03/12/2019, 04/09/2019, 11/22/2019   Pneumococcal Conjugate-13 10/21/2018   Pneumococcal Polysaccharide-23 02/15/2014, 10/20/2019   Td  05/10/2008   Tdap 11/17/2018   Zoster Recombinant(Shingrix) 07/21/2016, 10/06/2016   Health Maintenance Due  Topic Date Due   Colonoscopy  Never done      Past Medical History:  Diagnosis Date   Adjustment disorder 1/10   with depressive symptoms   Allergic rhinitis    Anxiety    Arthritis    Chronic cervical radiculopathy 02/02/2013   Chronic low back pain 08/12/2010   Depression    Hearing loss    sensorineural  bilateral   Hemorrhoids    HOH (hard of hearing)    Hyperlipidemia    Hyperlipidemia    Lumbago    Psoriasis    Retinal tear    bilateral no surgery   Shingles    Past Surgical History:  Procedure Laterality Date   CATARACT EXTRACTION Left    detatched retina Left    laser surgery   EYE SURGERY     HEMORRHOID SURGERY      reports that he has never smoked. He has never used smokeless tobacco. He reports that he does not drink alcohol and does not use drugs. family history includes Cancer in his father and mother; Diabetes in an other family member. Allergies  Allergen Reactions   Allegra [Fexofenadine Hcl] Other (See Comments)    constipation   Escitalopram Oxalate     constipation   Naproxen     constipation   Statins Other (See Comments)    constipation   Current Outpatient Medications on File Prior  to Visit  Medication Sig Dispense Refill   acetaminophen (TYLENOL) 500 MG tablet Take 500 mg by mouth every 6 (six) hours as needed.     ALPRAZolam (XANAX) 1 MG tablet Take 1 tablet by mouth three times daily as needed 90 tablet 5   Calcitriol 3 MCG/GM cream Apply topically at bedtime.     carboxymethylcellulose (REFRESH PLUS) 0.5 % SOLN 1 drop daily as needed.     Cholecalciferol 50 MCG (2000 UT) TABS 1 tab by mouth once daily 30 tablet 99   clotrimazole-betamethasone (LOTRISONE) cream Apply 1 application topically daily. 30 g 2   dimenhyDRINATE (DRAMAMINE) 50 MG tablet Take 25 mg by mouth daily.     ibuprofen (ADVIL) 800 MG tablet TAKE 1 TABLET BY  MOUTH EVERY 8 HOURS AS NEEDED 60 tablet 2   Polyethyl Glycol-Propyl Glycol (SYSTANE OP) Apply to eye 3 (three) times daily.     promethazine (PHENERGAN) 25 MG tablet Take 1 tablet (25 mg total) by mouth every 6 (six) hours as needed for nausea or vomiting. 30 tablet 5   tiZANidine (ZANAFLEX) 4 MG tablet Take 1 tablet (4 mg total) by mouth every 6 (six) hours as needed for muscle spasms. 120 tablet 2   No current facility-administered medications on file prior to visit.        ROS:  All others reviewed and negative.  Objective        PE:  BP (!) 140/82 (BP Location: Left Arm, Patient Position: Sitting, Cuff Size: Normal)   Pulse 82   Temp (!) 97.5 F (36.4 C) (Oral)   Ht 5\' 6"  (1.676 m)   Wt 170 lb (77.1 kg)   SpO2 99%   BMI 27.44 kg/m                 Constitutional: Pt appears in NAD               HENT: Head: NCAT.                Right Ear: External ear normal.                 Left Ear: External ear normal.                Eyes: . Pupils are equal, round, and reactive to light. Conjunctivae and EOM are normal               Nose: without d/c or deformity               Neck: Neck supple. Gross normal ROM               Cardiovascular: Normal rate and regular rhythm.                 Pulmonary/Chest: Effort normal and breath sounds without rales or wheezing.                Abd:  Soft, NT, ND, + BS, no organomegaly               Neurological: Pt is alert. At baseline orientation, motor grossly intact               Skin: Skin is warm. No rashes, no other new lesions, LE edema - none               Psychiatric: Pt behavior is normal without agitation , depressed affect  Micro: none  Cardiac tracings I have personally interpreted  today:  none  Pertinent Radiological findings (summarize): none   Lab Results  Component Value Date   WBC 7.1 09/04/2021   HGB 13.4 09/04/2021   HCT 39.9 09/04/2021   PLT 185.0 09/04/2021   GLUCOSE 92 09/04/2021   CHOL 173 09/04/2021   TRIG 118.0  09/04/2021   HDL 45.80 09/04/2021   LDLDIRECT 100.0 09/18/2016   LDLCALC 104 (H) 09/04/2021   ALT 20 09/04/2021   AST 23 09/04/2021   NA 129 (L) 09/04/2021   K 4.6 09/04/2021   CL 96 09/04/2021   CREATININE 0.87 09/04/2021   BUN 12 09/04/2021   CO2 30 09/04/2021   TSH 2.18 09/04/2021   PSA 0.91 09/04/2021   HGBA1C 5.8 09/04/2021   Assessment/Plan:  Ricky Kelly is a 73 y.o. White or Caucasian [1] male with  has a past medical history of Adjustment disorder (1/10), Allergic rhinitis, Anxiety, Arthritis, Chronic cervical radiculopathy (02/02/2013), Chronic low back pain (08/12/2010), Depression, Hearing loss, Hemorrhoids, HOH (hard of hearing), Hyperlipidemia, Hyperlipidemia, Lumbago, Psoriasis, Retinal tear, and Shingles.  Vitamin D deficiency Last vitamin D Lab Results  Component Value Date   VD25OH 11.12 (L) 09/04/2021   Low, to start oral replacement   Anxiety state Pt advised to make appt at the first floor today with counseling appt,   Depression O/w stable, declines need for SSRI or similar today  Chronic pain syndrome Pt to continue to follow pain management  Hyperlipidemia Lab Results  Component Value Date   LDLCALC 104 (H) 09/04/2021   uncontrolled, pt for lower chol diet, declines statin   Hyperglycemia Lab Results  Component Value Date   HGBA1C 5.8 09/04/2021   Stable, pt to continue current medical treatment  - diet,w t control  Followup: Return in about 6 months (around 09/15/2023).  Oliver Barre, MD 03/21/2023 8:22 PM [facility]

## 2023-03-18 NOTE — Assessment & Plan Note (Signed)
 Last vitamin D Lab Results  Component Value Date   VD25OH 11.12 (L) 09/04/2021   Low, to start oral replacement

## 2023-03-21 ENCOUNTER — Encounter: Payer: Self-pay | Admitting: Internal Medicine

## 2023-03-21 NOTE — Assessment & Plan Note (Signed)
 Pt to continue to follow pain management

## 2023-03-21 NOTE — Assessment & Plan Note (Signed)
 O/w stable, declines need for SSRI or similar today

## 2023-03-21 NOTE — Assessment & Plan Note (Signed)
Lab Results  Component Value Date   HGBA1C 5.8 09/04/2021   Stable, pt to continue current medical treatment  - diet, wt control

## 2023-03-21 NOTE — Assessment & Plan Note (Signed)
 Pt advised to make appt at the first floor today with counseling appt,

## 2023-03-21 NOTE — Assessment & Plan Note (Signed)
 Lab Results  Component Value Date   LDLCALC 104 (H) 09/04/2021   uncontrolled, pt for lower chol diet, declines statin

## 2023-04-09 ENCOUNTER — Encounter: Payer: Self-pay | Admitting: Internal Medicine

## 2023-04-23 ENCOUNTER — Telehealth: Payer: Self-pay | Admitting: Internal Medicine

## 2023-04-23 DIAGNOSIS — L989 Disorder of the skin and subcutaneous tissue, unspecified: Secondary | ICD-10-CM

## 2023-04-23 NOTE — Telephone Encounter (Signed)
Ok referral is done.

## 2023-04-23 NOTE — Telephone Encounter (Signed)
 Copied from CRM [id number]. Topic: General - Other >> Apr 23, 2023  2:11 PM Ricky Kelly wrote: Reason for CRM: patient called stating the doctor saw something on his arm and stated if it does not clear up then he needs go see a dermatologist. Patient stated now there is something on his leg and he want a referral for a dermatologist. Patient want the referral to be sent to a place called [facility] on [address]

## 2023-05-05 ENCOUNTER — Ambulatory Visit (INDEPENDENT_AMBULATORY_CARE_PROVIDER_SITE_OTHER): Admitting: Psychology

## 2023-05-05 DIAGNOSIS — F331 Major depressive disorder, recurrent, moderate: Secondary | ICD-10-CM
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
## 2023-05-31 ENCOUNTER — Other Ambulatory Visit: Payer: Self-pay | Admitting: Internal Medicine

## 2023-06-11 ENCOUNTER — Ambulatory Visit (INDEPENDENT_AMBULATORY_CARE_PROVIDER_SITE_OTHER): Admitting: Psychology

## 2023-06-11 DIAGNOSIS — F331 Major depressive disorder, recurrent, moderate: Secondary | ICD-10-CM | POA: Diagnosis not present
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
## 2023-07-09 ENCOUNTER — Ambulatory Visit (INDEPENDENT_AMBULATORY_CARE_PROVIDER_SITE_OTHER): Admitting: Psychology

## 2023-07-09 DIAGNOSIS — F331 Major depressive disorder, recurrent, moderate: Secondary | ICD-10-CM | POA: Diagnosis not present

## 2023-07-09 NOTE — Progress Notes (Signed)
 [name] Counselor/Therapist Progress Note  Patient ID: Ricky Kelly, MRN: [id number],    Date: 07/09/2023  Time Spent: 11:00am-11:55am  55 minutes   Treatment Type: Individual Therapy  Reported Symptoms: stress, sadness  Mental Status Exam: Appearance:  Casual     Behavior: Appropriate  Motor: Normal  Speech/Language:  Normal Rate  Affect: Appropriate  Mood: normal  Thought process: normal  Thought content:   WNL  Sensory/Perceptual disturbances:   WNL  Orientation: oriented to person, place, time/date, and situation  Attention: Good  Concentration: Good  Memory: WNL  Fund of knowledge:  Good  Insight:   Good  Judgment:  Good  Impulse Control: Good   Risk Assessment: Danger to Self:  No Self-injurious Behavior: No Danger to Others: No Duty to Warn:no Physical Aggression / Violence:No  Access to Firearms a concern: No  Gang Involvement:No   Subjective: Pt present for face-to-face individual therapy via phone.  Pt consents to telehealth session and is aware of limitations and benefits of virtual sessions.   Location of pt: home Location of therapist: home office.   Pt talked about father's day.  He spent time with his daughter but did not see his son.   Pt states he is still in shock about what has happened with his son.   Pt states father's day was bitter sweet.  Pt talked about his daughter Ricky Kelly.  Pt has to rely on help from her but she is very busy with work and her social life.   Ricky Kelly is a Runner, broadcasting/film/video and is in a 4 year relationship.   Pt states Ricky Kelly and her partner drink alcohol a lot and pt is concerned about it.  Pt is not sure whether to say something to them about the drinking.  Addressed the issues.   Helped pt process his feelings and relationship dynamics.  Worked on self care strategies. Provided supportive therapy.  Interventions: Cognitive Behavioral Therapy and Insight-Oriented  Diagnosis:  F33.1  Plan of Care:   Recommend ongoing therapy. Pt participated in setting treatment goals.  Pt wants a safe place to talk and to vent.  Pt wants to improve coping skills.   Plan to meet once a month.  Pt agrees with treatment plan.    Treatment Plan  (target date:  05/04/2024) Client Abilities/Strengths  Pt is engaging, and motivated for therapy.  Client Treatment Preferences  Individual therapy.  Client Statement of Needs  Improve coping skills.  Symptoms  Depressed Kelly irritable mood.  Diminished interest in Kelly enjoyment of activities.  Problems Addressed  Unipolar Depression Goals 1. Alleviate depressive symptoms and return to previous level of effective functioning. 2. Appropriately grieve the loss in order to normalize mood and to return to previously adaptive level of functioning. Objective Learn and implement behavioral strategies to overcome depression. Target Date: 2024-05-04 Frequency: monthly  Progress: 20 Modality: individual  Related Interventions Engage the client in behavioral activation, increasing his/her activity level and contact with sources of reward, while identifying processes that inhibit activation.  Use behavioral techniques such as instruction, rehearsal, role-playing, role reversal, as needed, to facilitate activity in the client's daily life; reinforce success. Assist the client in developing skills that increase the likelihood of deriving pleasure from behavioral activation (e.g., assertiveness skills, developing an exercise plan, less internal/more external focus, increased social involvement); reinforce success. Objective Identify important people in life, past and present, and describe the quality, good and poor, of those relationships. Target Date: 2024-05-04 Frequency: monthly  Progress:  20 Modality: individual  Related Interventions Conduct Interpersonal Therapy beginning with the assessment of the client's interpersonal inventory of important past and present  relationships; develop a case formulation linking depression to grief, interpersonal role disputes, role transitions, and/Kelly interpersonal deficits). Objective Learn and implement problem-solving and decision-making skills. Target Date: 2024-05-04 Frequency: monthly  Progress: 20 Modality: individual  Related Interventions Conduct Problem-Solving Therapy using techniques such as psychoeducation, modeling, and role-playing to teach client problem-solving skills (i.e., defining a problem specifically, generating possible solutions, evaluating the pros and cons of each solution, selecting and implementing a plan of action, evaluating the efficacy of the plan, accepting Kelly revising the plan); role-play application of the problem-solving skill to a real life issue. Encourage in the client the development of a positive problem orientation in which problems and solving them are viewed as a natural part of life and not something to be feared, despaired, Kelly avoided. 3. Develop healthy interpersonal relationships that lead to the alleviation and help prevent the relapse of depression. 4. Develop healthy thinking patterns and beliefs about self, others, and the world that lead to the alleviation and help prevent the relapse of depression. 5. Recognize, accept, and cope with feelings of depression. Diagnosis F33.1  Conditions For Discharge Achievement of treatment goals and objectives   Willey Harrier, LCSW

## 2023-08-20 ENCOUNTER — Ambulatory Visit (INDEPENDENT_AMBULATORY_CARE_PROVIDER_SITE_OTHER): Admitting: Psychology

## 2023-08-20 DIAGNOSIS — F331 Major depressive disorder, recurrent, moderate: Secondary | ICD-10-CM

## 2023-08-20 NOTE — Progress Notes (Signed)
 [facility] Counselor/Therapist Progress Note  Patient ID: Ricky Kelly, MRN: [id number],    Date: 08/20/2023  Time Spent: 11:00am-11:55am  55 minutes   Treatment Type: Individual Therapy  Reported Symptoms: stress, sadness  Mental Status Exam: Appearance:  Casual     Behavior: Appropriate  Motor: Normal  Speech/Language:  Normal Rate  Affect: Appropriate  Mood: normal  Thought process: normal  Thought content:   WNL  Sensory/Perceptual disturbances:   WNL  Orientation: oriented to person, place, time/date, and situation  Attention: Good  Concentration: Good  Memory: WNL  Fund of knowledge:  Good  Insight:   Good  Judgment:  Good  Impulse Control: Good   Risk Assessment: Danger to Self:  No Self-injurious Behavior: No Danger to Others: No Duty to Warn:no Physical Aggression / Violence:No  Access to Firearms a concern: No  Gang Involvement:No   Subjective: Pt present for face-to-face individual therapy via phone.  Pt consents to telehealth session and is aware of limitations and benefits of virtual sessions.   Location of pt: home Location of therapist: home office.   Pt talked about his health.   Pt has severe arthritis and pain issues.   Pt talked about his son Ricky Kelly.  Pt's nephew has gotten involved with the issues with pt's son and pt's son was challenging to deal with.  Pt's son Ricky Kelly contacted pt's best friend about his relationship with pt.  Ricky Kelly is trying to find out where pt is living and pt does not want him to know.   Ricky Kelly sent pt a note through pt's friend and Ricky Kelly apologized for what he has done.  Ricky Kelly is living with his mother.   Pt's daughter Ricky Kelly was involved with reading the note from Ricky Kelly and is being a support to pt.   Pt's friend is also being very supportive.  Pt and his friend have agreed that Ricky Kelly can communicate with pt through pt's friend.   Helped pt process his feelings and relationship dynamics.   Pt wants the best for  Ricky Kelly but has been really hurt by how Ricky Kelly has treated him.  Pt also knows to not trust Ricky Kelly bc he has a history of chronic lying.   Worked on self care strategies. Provided supportive therapy.  Interventions: Cognitive Behavioral Therapy and Insight-Oriented  Diagnosis:  F33.1  Plan of Care:  Recommend ongoing therapy. Pt participated in setting treatment goals.  Pt wants a safe place to talk and to vent.  Pt wants to improve coping skills.   Plan to meet once a month.  Pt agrees with treatment plan.    Treatment Plan  (target date:  05/04/2024) Client Abilities/Strengths  Pt is engaging, and motivated for therapy.  Client Treatment Preferences  Individual therapy.  Client Statement of Needs  Improve coping skills.  Symptoms  Depressed or irritable mood.  Diminished interest in or enjoyment of activities.  Problems Addressed  Unipolar Depression Goals 1. Alleviate depressive symptoms and return to previous level of effective functioning. 2. Appropriately grieve the loss in order to normalize mood and to return to previously adaptive level of functioning. Objective Learn and implement behavioral strategies to overcome depression. Target Date: 2024-05-04 Frequency: monthly  Progress: 20 Modality: individual  Related Interventions Engage the client in behavioral activation, increasing his/her activity level and contact with sources of reward, while identifying processes that inhibit activation.  Use behavioral techniques such as instruction, rehearsal, role-playing, role reversal, as needed, to facilitate activity in the client's daily  life; reinforce success. Assist the client in developing skills that increase the likelihood of deriving pleasure from behavioral activation (e.g., assertiveness skills, developing an exercise plan, less internal/more external focus, increased social involvement); reinforce success. Objective Identify important people in life, past and present, and  describe the quality, good and poor, of those relationships. Target Date: 2024-05-04 Frequency: monthly  Progress: 20 Modality: individual  Related Interventions Conduct Interpersonal Therapy beginning with the assessment of the client's interpersonal inventory of important past and present relationships; develop a case formulation linking depression to grief, interpersonal role disputes, role transitions, and/or interpersonal deficits). Objective Learn and implement problem-solving and decision-making skills. Target Date: 2024-05-04 Frequency: monthly  Progress: 20 Modality: individual  Related Interventions Conduct Problem-Solving Therapy using techniques such as psychoeducation, modeling, and role-playing to teach client problem-solving skills (i.e., defining a problem specifically, generating possible solutions, evaluating the pros and cons of each solution, selecting and implementing a plan of action, evaluating the efficacy of the plan, accepting or revising the plan); role-play application of the problem-solving skill to a real life issue. Encourage in the client the development of a positive problem orientation in which problems and solving them are viewed as a natural part of life and not something to be feared, despaired, or avoided. 3. Develop healthy interpersonal relationships that lead to the alleviation and help prevent the relapse of depression. 4. Develop healthy thinking patterns and beliefs about self, others, and the world that lead to the alleviation and help prevent the relapse of depression. 5. Recognize, accept, and cope with feelings of depression. Diagnosis F33.1  Conditions For Discharge Achievement of treatment goals and objectives   Veva Alma, LCSW

## 2023-09-10 ENCOUNTER — Other Ambulatory Visit (INDEPENDENT_AMBULATORY_CARE_PROVIDER_SITE_OTHER)

## 2023-09-10 DIAGNOSIS — E7800 Pure hypercholesterolemia, unspecified: Secondary | ICD-10-CM

## 2023-09-10 DIAGNOSIS — E538 Deficiency of other specified B group vitamins: Secondary | ICD-10-CM

## 2023-09-10 DIAGNOSIS — R739 Hyperglycemia, unspecified: Secondary | ICD-10-CM

## 2023-09-10 DIAGNOSIS — E559 Vitamin D deficiency, unspecified: Secondary | ICD-10-CM | POA: Diagnosis not present

## 2023-09-10 DIAGNOSIS — N320 Bladder-neck obstruction: Secondary | ICD-10-CM | POA: Diagnosis not present

## 2023-09-10 LAB — URINALYSIS, ROUTINE W REFLEX MICROSCOPIC
Bilirubin Urine: NEGATIVE
Hgb urine dipstick: NEGATIVE
Ketones, ur: NEGATIVE
Leukocytes,Ua: NEGATIVE
Nitrite: NEGATIVE
RBC / HPF: NONE SEEN (ref 0–?)
Specific Gravity, Urine: 1.01 (ref 1.000–1.030)
Total Protein, Urine: NEGATIVE
Urine Glucose: NEGATIVE
Urobilinogen, UA: 0.2 (ref 0.0–1.0)
WBC, UA: NONE SEEN (ref 0–?)
pH: 7 (ref 5.0–8.0)

## 2023-09-10 LAB — CBC WITH DIFFERENTIAL/PLATELET
Basophils Absolute: 0 K/uL (ref 0.0–0.1)
Basophils Relative: 0.5 % (ref 0.0–3.0)
Eosinophils Absolute: 0.1 K/uL (ref 0.0–0.7)
Eosinophils Relative: 1.7 % (ref 0.0–5.0)
HCT: 39.8 % (ref 39.0–52.0)
Hemoglobin: 13.4 g/dL (ref 13.0–17.0)
Lymphocytes Relative: 36.4 % (ref 12.0–46.0)
Lymphs Abs: 2.6 K/uL (ref 0.7–4.0)
MCHC: 33.5 g/dL (ref 30.0–36.0)
MCV: 90.2 fl (ref 78.0–100.0)
Monocytes Absolute: 0.8 K/uL (ref 0.1–1.0)
Monocytes Relative: 11.1 % (ref 3.0–12.0)
Neutro Abs: 3.6 K/uL (ref 1.4–7.7)
Neutrophils Relative %: 50.3 % (ref 43.0–77.0)
Platelets: 189 K/uL (ref 150.0–400.0)
RBC: 4.42 Mil/uL (ref 4.22–5.81)
RDW: 13.2 % (ref 11.5–15.5)
WBC: 7.1 K/uL (ref 4.0–10.5)

## 2023-09-10 LAB — TSH: TSH: 2.13 u[IU]/mL (ref 0.35–5.50)

## 2023-09-10 LAB — HEPATIC FUNCTION PANEL
ALT: 24 U/L (ref 0–53)
AST: 23 U/L (ref 0–37)
Albumin: 4.2 g/dL (ref 3.5–5.2)
Alkaline Phosphatase: 36 U/L — ABNORMAL LOW (ref 39–117)
Bilirubin, Direct: 0.1 mg/dL (ref 0.0–0.3)
Total Bilirubin: 0.6 mg/dL (ref 0.2–1.2)
Total Protein: 7.1 g/dL (ref 6.0–8.3)

## 2023-09-10 LAB — BASIC METABOLIC PANEL WITH GFR
BUN: 12 mg/dL (ref 6–23)
CO2: 27 meq/L (ref 19–32)
Calcium: 8.6 mg/dL (ref 8.4–10.5)
Chloride: 94 meq/L — ABNORMAL LOW (ref 96–112)
Creatinine, Ser: 0.86 mg/dL (ref 0.40–1.50)
GFR: 87.42 mL/min (ref >60.00)
Glucose, Bld: 113 mg/dL — ABNORMAL HIGH (ref 70–99)
Potassium: 4.1 meq/L (ref 3.5–5.1)
Sodium: 129 meq/L — ABNORMAL LOW (ref 135–145)

## 2023-09-10 LAB — LIPID PANEL
Cholesterol: 177 mg/dL (ref 0–200)
HDL: 46.4 mg/dL (ref >39.00)
LDL Cholesterol: 106 mg/dL — ABNORMAL HIGH (ref 0–99)
NonHDL: 130.76
Total CHOL/HDL Ratio: 4
Triglycerides: 125 mg/dL (ref 0.0–149.0)
VLDL: 25 mg/dL (ref 0.0–40.0)

## 2023-09-10 LAB — MICROALBUMIN / CREATININE URINE RATIO
Creatinine,U: 52.1 mg/dL
Microalb Creat Ratio: UNDETERMINED mg/g (ref 0.0–30.0)
Microalb, Ur: <0.7 mg/dL

## 2023-09-10 LAB — PSA: PSA: 1.14 ng/mL (ref 0.10–4.00)

## 2023-09-10 LAB — HEMOGLOBIN A1C: Hgb A1c MFr Bld: 6 % (ref 4.6–6.5)

## 2023-09-10 LAB — VITAMIN B12: Vitamin B-12: 434 pg/mL (ref 211–911)

## 2023-09-10 LAB — VITAMIN D 25 HYDROXY (VIT D DEFICIENCY, FRACTURES): VITD: 12.63 ng/mL — ABNORMAL LOW (ref 30.00–100.00)

## 2023-09-15 ENCOUNTER — Ambulatory Visit: Payer: Medicare Other | Admitting: Internal Medicine

## 2023-09-16 ENCOUNTER — Ambulatory Visit (INDEPENDENT_AMBULATORY_CARE_PROVIDER_SITE_OTHER): Admitting: Internal Medicine

## 2023-09-16 ENCOUNTER — Encounter: Payer: Self-pay | Admitting: Internal Medicine

## 2023-09-16 VITALS — BP 130/80 | HR 72 | Temp 98.0°F | Ht 66.0 in

## 2023-09-16 DIAGNOSIS — G4762 Sleep related leg cramps: Secondary | ICD-10-CM

## 2023-09-16 DIAGNOSIS — R030 Elevated blood-pressure reading, without diagnosis of hypertension: Secondary | ICD-10-CM

## 2023-09-16 DIAGNOSIS — R739 Hyperglycemia, unspecified: Secondary | ICD-10-CM | POA: Diagnosis not present

## 2023-09-16 DIAGNOSIS — E559 Vitamin D deficiency, unspecified: Secondary | ICD-10-CM

## 2023-09-16 DIAGNOSIS — K130 Diseases of lips: Secondary | ICD-10-CM | POA: Diagnosis not present

## 2023-09-16 DIAGNOSIS — B370 Candidal stomatitis: Secondary | ICD-10-CM | POA: Diagnosis not present

## 2023-09-16 DIAGNOSIS — E7800 Pure hypercholesterolemia, unspecified: Secondary | ICD-10-CM

## 2023-09-16 MED ORDER — NYSTATIN 100000 UNIT/ML MT SUSP: 500000.0000 [IU] | Freq: Four times a day (QID) | OROMUCOSAL | 1 refills | Status: AC

## 2023-09-16 MED ORDER — KETOCONAZOLE 2 % EX CREA: 1.0000 | TOPICAL_CREAM | Freq: Every day | CUTANEOUS | 0 refills | Status: DC

## 2023-09-16 NOTE — Patient Instructions (Addendum)
 Please take OTC Vitamin D3 at 2000 units per day, indefinitely  Please take all new medication as prescribed  - the cream for the lip crease  Please take all new medication as prescribed - the solution for mouthwash  Ok to use the tizanidine  at bedtime to avoid leg cramps  Please continue all other medications as before, and refills have been done if requested.  Please have the pharmacy call with any other refills you may need.  Please keep your appointments with your specialists as you may have planned  Please make an Appointment to return in 6 months, or sooner if needed

## 2023-09-16 NOTE — Progress Notes (Unsigned)
 Patient ID: Ricky Kelly, male   DOB: [birth year], 73 y.o.   MRN: [id number]        Chief Complaint: follow up low vit d, left angular cheilitis, thrush, leg cramps at night       HPI:  Ricky Kelly is a 73 y.o. male here with several complaints, mostly having to do with the crease that has occurred at the left lateral lip, not sure if drools at night.  Pt denies chest pain, increased sob or doe, wheezing, orthopnea, PND, increased LE swelling, palpitations, dizziness or syncope.   Pt denies polydipsia, polyuria, or new focal neuro s/s.   Also has off color to tongue.  Also has worsening leg cramps at night most nights of the past few wks for unclear reason.        Wt Readings from Last 3 Encounters:  03/18/23 170 lb (77.1 kg)  03/09/23 176 lb (79.8 kg)  09/17/22 170 lb (77.1 kg)   BP Readings from Last 3 Encounters:  09/16/23 130/80  03/18/23 (!) 140/82  09/17/22 132/82         Past Medical History:  Diagnosis Date   Adjustment disorder 1/10   with depressive symptoms   Allergic rhinitis    Anxiety    Arthritis    Chronic cervical radiculopathy 02/02/2013   Chronic low back pain 08/12/2010   Depression    Hearing loss    sensorineural  bilateral   Hemorrhoids    HOH (hard of hearing)    Hyperlipidemia    Hyperlipidemia    Lumbago    Psoriasis    Retinal tear    bilateral no surgery   Shingles    Past Surgical History:  Procedure Laterality Date   CATARACT EXTRACTION Left    detatched retina Left    laser surgery   EYE SURGERY     HEMORRHOID SURGERY      reports that he has never smoked. He has never used smokeless tobacco. He reports that he does not drink alcohol and does not use drugs. family history includes Cancer in his father and mother; Diabetes in an other family member. Allergies  Allergen Reactions   Allegra [Fexofenadine Hcl] Other (See Comments)    constipation   Escitalopram Oxalate     constipation   Naproxen      constipation    Statins Other (See Comments)    constipation   Current Outpatient Medications on File Prior to Visit  Medication Sig Dispense Refill   acetaminophen  (TYLENOL ) 500 MG tablet Take 500 mg by mouth every 6 (six) hours as needed.     ALPRAZolam  (XANAX ) 1 MG tablet Take 1 tablet by mouth three times daily as needed 90 tablet 0   Calcitriol 3 MCG/GM cream Apply topically at bedtime.     carboxymethylcellulose (REFRESH PLUS) 0.5 % SOLN 1 drop daily as needed.     Cholecalciferol  50 MCG (2000 UT) TABS 1 tab by mouth once daily 30 tablet 99   clotrimazole -betamethasone  (LOTRISONE ) cream Apply 1 application topically daily. 30 g 2   dimenhyDRINATE (DRAMAMINE) 50 MG tablet Take 25 mg by mouth daily.     ibuprofen  (ADVIL ) 800 MG tablet TAKE 1 TABLET BY MOUTH EVERY 8 HOURS AS NEEDED 60 tablet 2   Polyethyl Glycol-Propyl Glycol (SYSTANE OP) Apply to eye 3 (three) times daily.     promethazine  (PHENERGAN ) 25 MG tablet Take 1 tablet (25 mg total) by mouth every 6 (six) hours as needed for nausea or  vomiting. 30 tablet 5   tiZANidine  (ZANAFLEX ) 4 MG tablet Take 1 tablet (4 mg total) by mouth every 6 (six) hours as needed for muscle spasms. 120 tablet 2   No current facility-administered medications on file prior to visit.        ROS:  All others reviewed and negative.  Objective        PE:  BP 130/80   Pulse 72   Temp 98 F (36.7 C)   Ht 5' 6 (1.676 m)   SpO2 99%   BMI 27.44 kg/m                 Constitutional: Pt appears in NAD               HENT: Head: NCAT.                Right Ear: External ear normal.                 Left Ear: External ear normal.                Eyes: . Pupils are equal, round, and reactive to light. Conjunctivae and EOM are normal               Nose: without d/c or deformity               Neck: Neck supple. Gross normal ROM               Cardiovascular: Normal rate and regular rhythm.                 Pulmonary/Chest: Effort normal and breath sounds without rales or  wheezing.                Abd:  Soft, NT, ND, + BS, no organomegaly               Neurological: Pt is alert. At baseline orientation, motor grossly intact               Skin: Skin is warm., LE edema - none,, tongue with off white color rash, also left angular cheilitis rrash noted               Psychiatric: Pt behavior is normal without agitation   Micro: none  Cardiac tracings I have personally interpreted today:  none  Pertinent Radiological findings (summarize): none   Lab Results  Component Value Date   WBC 7.1 09/10/2023   HGB 13.4 09/10/2023   HCT 39.8 09/10/2023   PLT 189.0 09/10/2023   GLUCOSE 113 (H) 09/10/2023   CHOL 177 09/10/2023   TRIG 125.0 09/10/2023   HDL 46.40 09/10/2023   LDLDIRECT 100.0 09/18/2016   LDLCALC 106 (H) 09/10/2023   ALT 24 09/10/2023   AST 23 09/10/2023   NA 129 (L) 09/10/2023   K 4.1 09/10/2023   CL 94 (L) 09/10/2023   CREATININE 0.86 09/10/2023   BUN 12 09/10/2023   CO2 27 09/10/2023   TSH 2.13 09/10/2023   PSA 1.14 09/10/2023   HGBA1C 6.0 09/10/2023   MICROALBUR <0.7 09/10/2023   Assessment/Plan:  Ricky Kelly is a 73 y.o. White or Caucasian [1] male with  has a past medical history of Adjustment disorder (1/10), Allergic rhinitis, Anxiety, Arthritis, Chronic cervical radiculopathy (02/02/2013), Chronic low back pain (08/12/2010), Depression, Hearing loss, Hemorrhoids, HOH (hard of hearing), Hyperlipidemia, Hyperlipidemia, Lumbago, Psoriasis, Retinal tear, and Shingles.  Vitamin D  deficiency Last vitamin D  Lab  Results  Component Value Date   VD25OH 12.63 (L) 09/10/2023   Low, to start oral replacement   Hyperglycemia Lab Results  Component Value Date   HGBA1C 6.0 09/10/2023   Stable, pt to continue current medical treatment  - diet, wt control   Blood pressure elevated without history of HTN BP Readings from Last 3 Encounters:  09/16/23 130/80  03/18/23 (!) 140/82  09/17/22 132/82   improved, pt to continue medical  treatment  - diet, wt control   Thrush Mild to mod, for nystatin  soln asd,,  to f/u any worsening symptoms or concerns  Angular cheilitis Mild to mod, for ketoconozole cream asd,,  to f/u any worsening symptoms or concerns  Nocturnal leg cramps Pt encouraged to use the home tizanidine  at bedtime prn  Followup: Return in about 6 months (around 03/18/2024).  Lynwood Rush, MD 09/17/2023 8:15 PM [facility]

## 2023-09-17 ENCOUNTER — Ambulatory Visit (INDEPENDENT_AMBULATORY_CARE_PROVIDER_SITE_OTHER): Admitting: Psychology

## 2023-09-17 ENCOUNTER — Encounter: Payer: Self-pay | Admitting: Internal Medicine

## 2023-09-17 DIAGNOSIS — F331 Major depressive disorder, recurrent, moderate: Secondary | ICD-10-CM

## 2023-09-17 DIAGNOSIS — K130 Diseases of lips: Secondary | ICD-10-CM | POA: Insufficient documentation

## 2023-09-17 DIAGNOSIS — G4762 Sleep related leg cramps: Secondary | ICD-10-CM | POA: Insufficient documentation

## 2023-09-17 DIAGNOSIS — B370 Candidal stomatitis: Secondary | ICD-10-CM | POA: Insufficient documentation

## 2023-09-17 NOTE — Assessment & Plan Note (Signed)
 Lab Results  Component Value Date   HGBA1C 6.0 09/10/2023   Stable, pt to continue current medical treatment  - diet, wt control

## 2023-09-17 NOTE — Assessment & Plan Note (Signed)
 Mild to mod, for nystatin  soln asd,,  to f/u any worsening symptoms or concerns

## 2023-09-17 NOTE — Assessment & Plan Note (Signed)
 Pt encouraged to use the home tizanidine  at bedtime prn

## 2023-09-17 NOTE — Assessment & Plan Note (Signed)
 Mild to mod, for ketoconozole cream asd,,  to f/u any worsening symptoms or concerns

## 2023-09-17 NOTE — Assessment & Plan Note (Signed)
 BP Readings from Last 3 Encounters:  09/16/23 130/80  03/18/23 (!) 140/82  09/17/22 132/82   improved, pt to continue medical treatment  - diet, wt control

## 2023-09-17 NOTE — Progress Notes (Signed)
 [facility] Counselor/Therapist Progress Note  Patient ID: IBROHIM SIMMERS, MRN: [id number],    Date: 09/17/2023  Time Spent: 11:00am-11:55am  55 minutes   Treatment Type: Individual Therapy  Reported Symptoms: stress, sadness  Mental Status Exam: Appearance:  Casual     Behavior: Appropriate  Motor: Normal  Speech/Language:  Normal Rate  Affect: Appropriate  Mood: normal  Thought process: normal  Thought content:   WNL  Sensory/Perceptual disturbances:   WNL  Orientation: oriented to person, place, time/date, and situation  Attention: Good  Concentration: Good  Memory: WNL  Fund of knowledge:  Good  Insight:   Good  Judgment:  Good  Impulse Control: Good   Risk Assessment: Danger to Self:  No Self-injurious Behavior: No Danger to Others: No Duty to Warn:no Physical Aggression / Violence:No  Access to Firearms a concern: No  Gang Involvement:No   Subjective: Pt present for face-to-face individual therapy via phone.  Pt consents to telehealth session and is aware of limitations and benefits of virtual sessions.   Location of pt: home Location of therapist: home office.   Pt talked about his health.   Pt has severe arthritis and pain issues.  He dreads the weather getting colder bc the cold makes his pain worse.  Pt talked about his son [name].  Pt has had contact with [name] through his friend.   Pt feels sad about the situation and misses [name] but knows he can't trust him and still would not feel safe with [name] knowing where pt is living.   Addressed how pt is going to need to continue to have strict boundaries around any contact with [name].  Helped pt process his feelings and relationship dynamics.   Pt wants the best for [name] but has been really hurt by how [name] has treated him.  Pt also knows to not trust [name] bc he has a history of chronic lying.   Pt states he is trying to do things to take care of himself.  He has gone to an audiologist to get his  hearing loss addressed.  He was told his hearing is irreparable.  This was disappointing to pt.   Worked on self care strategies. Provided supportive therapy.  Interventions: Cognitive Behavioral Therapy and Insight-Oriented  Diagnosis:  F33.1  Plan of Care:  Recommend ongoing therapy. Pt participated in setting treatment goals.  Pt wants a safe place to talk and to vent.  Pt wants to improve coping skills.   Plan to meet once a month.  Pt agrees with treatment plan.    Treatment Plan  (target date:  05/04/2024) Client Abilities/Strengths  Pt is engaging, and motivated for therapy.  Client Treatment Preferences  Individual therapy.  Client Statement of Needs  Improve coping skills.  Symptoms  Depressed or irritable mood.  Diminished interest in or enjoyment of activities.  Problems Addressed  Unipolar Depression Goals 1. Alleviate depressive symptoms and return to previous level of effective functioning. 2. Appropriately grieve the loss in order to normalize mood and to return to previously adaptive level of functioning. Objective Learn and implement behavioral strategies to overcome depression. Target Date: 2024-05-04 Frequency: monthly  Progress: 20 Modality: individual  Related Interventions Engage the client in behavioral activation, increasing his/her activity level and contact with sources of reward, while identifying processes that inhibit activation.  Use behavioral techniques such as instruction, rehearsal, role-playing, role reversal, as needed, to facilitate activity in the client's daily life; reinforce success. Assist the client in developing  skills that increase the likelihood of deriving pleasure from behavioral activation (e.g., assertiveness skills, developing an exercise plan, less internal/more external focus, increased social involvement); reinforce success. Objective Identify important people in life, past and present, and describe the quality, good and poor,  of those relationships. Target Date: 2024-05-04 Frequency: monthly  Progress: 20 Modality: individual  Related Interventions Conduct Interpersonal Therapy beginning with the assessment of the client's interpersonal inventory of important past and present relationships; develop a case formulation linking depression to grief, interpersonal role disputes, role transitions, and/or interpersonal deficits). Objective Learn and implement problem-solving and decision-making skills. Target Date: 2024-05-04 Frequency: monthly  Progress: 20 Modality: individual  Related Interventions Conduct Problem-Solving Therapy using techniques such as psychoeducation, modeling, and role-playing to teach client problem-solving skills (i.e., defining a problem specifically, generating possible solutions, evaluating the pros and cons of each solution, selecting and implementing a plan of action, evaluating the efficacy of the plan, accepting or revising the plan); role-play application of the problem-solving skill to a real life issue. Encourage in the client the development of a positive problem orientation in which problems and solving them are viewed as a natural part of life and not something to be feared, despaired, or avoided. 3. Develop healthy interpersonal relationships that lead to the alleviation and help prevent the relapse of depression. 4. Develop healthy thinking patterns and beliefs about self, others, and the world that lead to the alleviation and help prevent the relapse of depression. 5. Recognize, accept, and cope with feelings of depression. Diagnosis F33.1  Conditions For Discharge Achievement of treatment goals and objectives   Veva Alma, LCSW

## 2023-09-17 NOTE — Assessment & Plan Note (Signed)
 Last vitamin D  Lab Results  Component Value Date   VD25OH 12.63 (L) 09/10/2023   Low, to start oral replacement

## 2023-09-24 ENCOUNTER — Telehealth: Payer: Self-pay

## 2023-09-24 NOTE — Telephone Encounter (Signed)
 Copied from CRM [id number]. Topic: Clinical - Medication Question >> Sep 24, 2023 10:51 AM Ricky Kelly wrote: Reason for CRM: Patient would like to know how many days to use  how many days to use cream clotrimazole -betamethasone  (LOTRISONE ) cream.

## 2023-09-25 NOTE — Telephone Encounter (Signed)
 Patient called with question about his new Ketoconazole  Cream and how long he should keep using this medication.  Instructed to use as long as he feel s he needs it and to call us  if the crease and twitch of his mouth get worse.   Patient agreed with plan, no additional questions at this time.

## 2023-10-15 ENCOUNTER — Other Ambulatory Visit: Payer: Self-pay | Admitting: Internal Medicine

## 2023-10-15 ENCOUNTER — Ambulatory Visit (INDEPENDENT_AMBULATORY_CARE_PROVIDER_SITE_OTHER): Admitting: Psychology

## 2023-10-15 DIAGNOSIS — F331 Major depressive disorder, recurrent, moderate: Secondary | ICD-10-CM | POA: Diagnosis not present

## 2023-10-15 NOTE — Progress Notes (Signed)
 [facility] Counselor/Therapist Progress Note  Patient ID: Ricky Kelly, MRN: [id number],    Date: 10/15/2023  Time Spent: 11:00am-11:55am  55 minutes   Treatment Type: Individual Therapy  Reported Symptoms: stress, sadness  Mental Status Exam: Appearance:  Casual     Behavior: Appropriate  Motor: Normal  Speech/Language:  Normal Rate  Affect: Appropriate  Mood: normal  Thought process: normal  Thought content:   WNL  Sensory/Perceptual disturbances:   WNL  Orientation: oriented to person, place, time/date, and situation  Attention: Good  Concentration: Good  Memory: WNL  Fund of knowledge:  Good  Insight:   Good  Judgment:  Good  Impulse Control: Good   Risk Assessment: Danger to Self:  No Self-injurious Behavior: No Danger to Others: No Duty to Warn:no Physical Aggression / Violence:No  Access to Firearms a concern: No  Gang Involvement:No   Subjective: [name] present for face-to-face individual therapy via phone.  Pt consents to telehealth session and is aware of limitations and benefits of virtual sessions.   Location of pt: home Location of therapist: home office.   Pt talked about his health.   Pt has severe arthritis and pain issues.  He has been feeling so badly the past couple of weeks that he didn't know if he could take anymore.  Pt states he relied on his faith to get through it.   Worked on additional coping skills.  Pt talked about his daughter.   She cleans pt's apartment for him but does not do a very good job.  Pt had a professional cleaner look at his apartment and found out he has a lot of mold.  He had to throw a lot of things away which was upsetting to him and costly.   Pt is going to work with the apartment staff to find the origin of the mold. Pt talked about his relationship with his son Ricky Kelly.  Pt feels like he needs to have a conversation with Ricky Kelly in order to get closure regarding the issues.  Pt is going to talk with his  daughter about this and we will address this issue next session.   Pt states he is trying to do things to take care of himself.   Worked on self care strategies. Provided supportive therapy.  Interventions: Cognitive Behavioral Therapy and Insight-Oriented  Diagnosis:  F33.1  Plan of Care:  Recommend ongoing therapy. Pt participated in setting treatment goals.  Pt wants a safe place to talk and to vent.  Pt wants to improve coping skills.   Plan to meet once a month.  Pt agrees with treatment plan.    Treatment Plan  (target date:  05/04/2024) Client Abilities/Strengths  Pt is engaging, and motivated for therapy.  Client Treatment Preferences  Individual therapy.  Client Statement of Needs  Improve coping skills.  Symptoms  Depressed or irritable mood.  Diminished interest in or enjoyment of activities.  Problems Addressed  Unipolar Depression Goals 1. Alleviate depressive symptoms and return to previous level of effective functioning. 2. Appropriately grieve the loss in order to normalize mood and to return to previously adaptive level of functioning. Objective Learn and implement behavioral strategies to overcome depression. Target Date: 2024-05-04 Frequency: monthly  Progress: 20 Modality: individual  Related Interventions Engage the client in behavioral activation, increasing his/her activity level and contact with sources of reward, while identifying processes that inhibit activation.  Use behavioral techniques such as instruction, rehearsal, role-playing, role reversal, as needed, to facilitate  activity in the client's daily life; reinforce success. Assist the client in developing skills that increase the likelihood of deriving pleasure from behavioral activation (e.g., assertiveness skills, developing an exercise plan, less internal/more external focus, increased social involvement); reinforce success. Objective Identify important people in life, past and present, and  describe the quality, good and poor, of those relationships. Target Date: 2024-05-04 Frequency: monthly  Progress: 20 Modality: individual  Related Interventions Conduct Interpersonal Therapy beginning with the assessment of the client's interpersonal inventory of important past and present relationships; develop a case formulation linking depression to grief, interpersonal role disputes, role transitions, and/or interpersonal deficits). Objective Learn and implement problem-solving and decision-making skills. Target Date: 2024-05-04 Frequency: monthly  Progress: 20 Modality: individual  Related Interventions Conduct Problem-Solving Therapy using techniques such as psychoeducation, modeling, and role-playing to teach client problem-solving skills (i.e., defining a problem specifically, generating possible solutions, evaluating the pros and cons of each solution, selecting and implementing a plan of action, evaluating the efficacy of the plan, accepting or revising the plan); role-play application of the problem-solving skill to a real life issue. Encourage in the client the development of a positive problem orientation in which problems and solving them are viewed as a natural part of life and not something to be feared, despaired, or avoided. 3. Develop healthy interpersonal relationships that lead to the alleviation and help prevent the relapse of depression. 4. Develop healthy thinking patterns and beliefs about self, others, and the world that lead to the alleviation and help prevent the relapse of depression. 5. Recognize, accept, and cope with feelings of depression. Diagnosis F33.1  Conditions For Discharge Achievement of treatment goals and objectives   Veva Alma, LCSW

## 2023-10-19 ENCOUNTER — Other Ambulatory Visit: Payer: Self-pay

## 2023-11-12 ENCOUNTER — Ambulatory Visit: Admitting: Psychology

## 2023-11-12 DIAGNOSIS — F331 Major depressive disorder, recurrent, moderate: Secondary | ICD-10-CM

## 2023-11-12 NOTE — Progress Notes (Signed)
 [facility] Counselor/Therapist Progress Note  Patient ID: Ricky Kelly, MRN: [id number],    Date: 11/12/2023  Time Spent: 11:00am-11:55am  55 minutes   Treatment Type: Individual Therapy  Reported Symptoms: stress, sadness, loneliness  Mental Status Exam: Appearance:  Casual     Behavior: Appropriate  Motor: Normal  Speech/Language:  Normal Rate  Affect: Appropriate  Mood: normal  Thought process: normal  Thought content:   WNL  Sensory/Perceptual disturbances:   WNL  Orientation: oriented to person, place, time/date, and situation  Attention: Good  Concentration: Good  Memory: WNL  Fund of knowledge:  Good  Insight:   Good  Judgment:  Good  Impulse Control: Good   Risk Assessment: Danger to Self:  No Self-injurious Behavior: No Danger to Others: No Duty to Warn:no Physical Aggression / Violence:No  Access to Firearms a concern: No  Gang Involvement:No   Subjective: [name] present for face-to-face individual therapy via phone.  Pt consents to telehealth session and is aware of limitations and benefits of virtual sessions.   Location of pt: home Location of therapist: home office.   Pt talked about his health.   Pt has severe arthritis and pain issues.   Pt states he relies on his faith to cope.   Worked on additional coping skills.  Pt is feeling lonely bc he has limited contact with people.  Addressed pt's loneliness and helped him problem solve how he can increase his social connections.  Pt may look into getting an in home aide.  Pt goes to church a couple of times a week.   Pt talked about his relationship with his son Ricky Kelly.  Pt feels distraught about the issues with Ricky Kelly.  Pt feels like he needs to have a conversation with Ricky Kelly in order to get closure regarding the issues.  Pt doesn't feel he can make contact with Ricky Kelly without his daughter's approval.  Addressed how pt can have a conversation with her daughter about this.  Pt feels like he is a  burden to his daughter bc she has a lot of stress in her job.  Worked on self care strategies. Provided supportive therapy.  Interventions: Cognitive Behavioral Therapy and Insight-Oriented  Diagnosis:  F33.1  Plan of Care:  Recommend ongoing therapy. Pt participated in setting treatment goals.  Pt wants a safe place to talk and to vent.  Pt wants to improve coping skills.   Plan to meet once a month.  Pt agrees with treatment plan.    Treatment Plan  (target date:  05/04/2024) Client Abilities/Strengths  Pt is engaging, and motivated for therapy.  Client Treatment Preferences  Individual therapy.  Client Statement of Needs  Improve coping skills.  Symptoms  Depressed or irritable mood.  Diminished interest in or enjoyment of activities.  Problems Addressed  Unipolar Depression Goals 1. Alleviate depressive symptoms and return to previous level of effective functioning. 2. Appropriately grieve the loss in order to normalize mood and to return to previously adaptive level of functioning. Objective Learn and implement behavioral strategies to overcome depression. Target Date: 2024-05-04 Frequency: monthly  Progress: 20 Modality: individual  Related Interventions Engage the client in behavioral activation, increasing his/her activity level and contact with sources of reward, while identifying processes that inhibit activation.  Use behavioral techniques such as instruction, rehearsal, role-playing, role reversal, as needed, to facilitate activity in the client's daily life; reinforce success. Assist the client in developing skills that increase the likelihood of deriving pleasure from behavioral activation (  e.g., assertiveness skills, developing an exercise plan, less internal/more external focus, increased social involvement); reinforce success. Objective Identify important people in life, past and present, and describe the quality, good and poor, of those relationships. Target  Date: 2024-05-04 Frequency: monthly  Progress: 20 Modality: individual  Related Interventions Conduct Interpersonal Therapy beginning with the assessment of the client's interpersonal inventory of important past and present relationships; develop a case formulation linking depression to grief, interpersonal role disputes, role transitions, and/or interpersonal deficits). Objective Learn and implement problem-solving and decision-making skills. Target Date: 2024-05-04 Frequency: monthly  Progress: 20 Modality: individual  Related Interventions Conduct Problem-Solving Therapy using techniques such as psychoeducation, modeling, and role-playing to teach client problem-solving skills (i.e., defining a problem specifically, generating possible solutions, evaluating the pros and cons of each solution, selecting and implementing a plan of action, evaluating the efficacy of the plan, accepting or revising the plan); role-play application of the problem-solving skill to a real life issue. Encourage in the client the development of a positive problem orientation in which problems and solving them are viewed as a natural part of life and not something to be feared, despaired, or avoided. 3. Develop healthy interpersonal relationships that lead to the alleviation and help prevent the relapse of depression. 4. Develop healthy thinking patterns and beliefs about self, others, and the world that lead to the alleviation and help prevent the relapse of depression. 5. Recognize, accept, and cope with feelings of depression. Diagnosis F33.1  Conditions For Discharge Achievement of treatment goals and objectives   Veva Alma, LCSW

## 2023-12-10 ENCOUNTER — Ambulatory Visit: Admitting: Psychology

## 2023-12-10 DIAGNOSIS — F331 Major depressive disorder, recurrent, moderate: Secondary | ICD-10-CM | POA: Diagnosis not present

## 2023-12-10 NOTE — Progress Notes (Signed)
 [facility] Counselor/Therapist Progress Note  Patient ID: Ricky Kelly, MRN: [id number],    Date: 12/10/2023  Time Spent: 11:00am-11:55am  55 minutes   Treatment Type: Individual Therapy  Reported Symptoms: stress, worrying  Mental Status Exam: Appearance:  Casual     Behavior: Appropriate  Motor: Normal  Speech/Language:  Normal Rate  Affect: Appropriate  Mood: normal  Thought process: normal  Thought content:   WNL  Sensory/Perceptual disturbances:   WNL  Orientation: oriented to person, place, time/date, and situation  Attention: Good  Concentration: Good  Memory: WNL  Fund of knowledge:  Good  Insight:   Good  Judgment:  Good  Impulse Control: Good   Risk Assessment: Danger to Self:  No Self-injurious Behavior: No Danger to Others: No Duty to Warn:no Physical Aggression / Violence:No  Access to Firearms a concern: No  Gang Involvement:No   Subjective: [name] present for face-to-face individual therapy via phone.  Pt consents to telehealth session and is aware of limitations and benefits of virtual sessions.   Location of pt: home Location of therapist: home office.   Pt talked about reconciling with his son Ricky Kelly.   Pt had received an email from Ricky Kelly that mentioned suicide so pt responded and began to connect with Ricky Kelly regularly via email.  Ricky Kelly has been very apologetic about the past.  Ricky Kelly is living with his mother and has an online job and will be starting a college program in January.  Pt is glad that Ricky Kelly is getting his life back together and wants to maintain contact with Ricky Kelly but set the boundary with Ricky Kelly an told him he can not lie.  Ricky Kelly has had a problem with lying.  Ricky Kelly wants to see pt in person but does not want to tell his mother.   Pt is worried about Ricky Kelly lying to his mother and the consequences that could have.   Addressed the issues with pt and helped him problem solve how to communicate with Ricky Kelly.   Addressed that pt needs to be  consistent with the expectation for honesty and that includes not having Ricky Kelly lie to his mother about contact with pt.  Helped pt process his feelings and relationship dynamics.  Worked on self care strategies. Provided supportive therapy.  Interventions: Cognitive Behavioral Therapy and Insight-Oriented  Diagnosis:  F33.1  Plan of Care:  Recommend ongoing therapy. Pt participated in setting treatment goals.  Pt wants a safe place to talk and to vent.  Pt wants to improve coping skills.   Plan to meet once a month.  Pt agrees with treatment plan.    Treatment Plan  (target date:  05/04/2024) Client Abilities/Strengths  Pt is engaging, and motivated for therapy.  Client Treatment Preferences  Individual therapy.  Client Statement of Needs  Improve coping skills.  Symptoms  Depressed or irritable mood.  Diminished interest in or enjoyment of activities.  Problems Addressed  Unipolar Depression Goals 1. Alleviate depressive symptoms and return to previous level of effective functioning. 2. Appropriately grieve the loss in order to normalize mood and to return to previously adaptive level of functioning. Objective Learn and implement behavioral strategies to overcome depression. Target Date: 2024-05-04 Frequency: monthly  Progress: 20 Modality: individual  Related Interventions Engage the client in behavioral activation, increasing his/her activity level and contact with sources of reward, while identifying processes that inhibit activation.  Use behavioral techniques such as instruction, rehearsal, role-playing, role reversal, as needed, to facilitate activity in the client's daily  life; reinforce success. Assist the client in developing skills that increase the likelihood of deriving pleasure from behavioral activation (e.g., assertiveness skills, developing an exercise plan, less internal/more external focus, increased social involvement); reinforce success. Objective Identify  important people in life, past and present, and describe the quality, good and poor, of those relationships. Target Date: 2024-05-04 Frequency: monthly  Progress: 20 Modality: individual  Related Interventions Conduct Interpersonal Therapy beginning with the assessment of the client's interpersonal inventory of important past and present relationships; develop a case formulation linking depression to grief, interpersonal role disputes, role transitions, and/or interpersonal deficits). Objective Learn and implement problem-solving and decision-making skills. Target Date: 2024-05-04 Frequency: monthly  Progress: 20 Modality: individual  Related Interventions Conduct Problem-Solving Therapy using techniques such as psychoeducation, modeling, and role-playing to teach client problem-solving skills (i.e., defining a problem specifically, generating possible solutions, evaluating the pros and cons of each solution, selecting and implementing a plan of action, evaluating the efficacy of the plan, accepting or revising the plan); role-play application of the problem-solving skill to a real life issue. Encourage in the client the development of a positive problem orientation in which problems and solving them are viewed as a natural part of life and not something to be feared, despaired, or avoided. 3. Develop healthy interpersonal relationships that lead to the alleviation and help prevent the relapse of depression. 4. Develop healthy thinking patterns and beliefs about self, others, and the world that lead to the alleviation and help prevent the relapse of depression. 5. Recognize, accept, and cope with feelings of depression. Diagnosis F33.1  Conditions For Discharge Achievement of treatment goals and objectives   Veva Alma, LCSW

## 2024-01-07 ENCOUNTER — Ambulatory Visit (INDEPENDENT_AMBULATORY_CARE_PROVIDER_SITE_OTHER): Admitting: Psychology

## 2024-01-07 DIAGNOSIS — F331 Major depressive disorder, recurrent, moderate: Secondary | ICD-10-CM

## 2024-01-07 NOTE — Progress Notes (Addendum)
 "  [facility] Counselor/Therapist Progress Note  Patient ID: Ricky Kelly, MRN: [id number],    Date: 01/07/2024  Time Spent: 11:00am-11:55am  55 minutes   Treatment Type: Individual Therapy  Reported Symptoms: stress, worrying  Mental Status Exam: Appearance:  Casual     Behavior: Appropriate  Motor: Normal  Speech/Language:  Normal Rate  Affect: Appropriate  Mood: normal  Thought process: normal  Thought content:   WNL  Sensory/Perceptual disturbances:   WNL  Orientation: oriented to person, place, time/date, and situation  Attention: Good  Concentration: Good  Memory: WNL  Fund of knowledge:  Good  Insight:   Good  Judgment:  Good  Impulse Control: Good   Risk Assessment: Danger to Self:  No Self-injurious Behavior: No Danger to Others: No Duty to Warn:no Physical Aggression / Violence:No  Access to Firearms a concern: No  Gang Involvement:No   Subjective: [name] present for face-to-face individual therapy via phone.  The session is audio only bc the patient lacks the ability and technology to perform a video call. Pt consents to telehealth session and is aware of limitations and benefits of virtual sessions.   Location of pt: home Location of therapist: home office.   Pt talked about his relationship with his son Ricky Kelly.  Pt is glad that he and Ricky Kelly text daily and are back in each other's lives.   Ricky Kelly has still not told his mother they are in contact.  Pt is worried about Ricky Kelly lying to his mother and the consequences that could have.   Addressed the issues with pt and helped him problem solve how to communicate with Ricky Kelly.   Pt wants to see Ricky Kelly during Christmas and possibly have Ricky Kelly transport him to church services.   Pt is concerned about Ricky Kelly lying to him mother about getting together with pt.   Helped pt process his thoughts and feelings.  He is confused about what to do and is taking too much responsibility for Ricky Kelly's decisions.  Addressed that  pt needs to be consistent with the expectation for honesty and that includes not having Ricky Kelly lie to his mother about contact with pt.  Helped pt process relationship dynamics.  Worked on self care strategies. Provided supportive therapy.  Interventions: Cognitive Behavioral Therapy and Insight-Oriented  Diagnosis:  F33.1  Plan of Care:  Recommend ongoing therapy. Pt participated in setting treatment goals.  Pt wants a safe place to talk and to vent.  Pt wants to improve coping skills.   Plan to meet once a month.  Pt agrees with treatment plan.    Treatment Plan  (target date:  05/04/2024) Client Abilities/Strengths  Pt is engaging, and motivated for therapy.  Client Treatment Preferences  Individual therapy.  Client Statement of Needs  Improve coping skills.  Symptoms  Depressed or irritable mood.  Diminished interest in or enjoyment of activities.  Problems Addressed  Unipolar Depression Goals 1. Alleviate depressive symptoms and return to previous level of effective functioning. 2. Appropriately grieve the loss in order to normalize mood and to return to previously adaptive level of functioning. Objective Learn and implement behavioral strategies to overcome depression. Target Date: 2024-05-04 Frequency: monthly  Progress: 20 Modality: individual  Related Interventions Engage the client in behavioral activation, increasing his/her activity level and contact with sources of reward, while identifying processes that inhibit activation.  Use behavioral techniques such as instruction, rehearsal, role-playing, role reversal, as needed, to facilitate activity in the client's daily life; reinforce success. Assist  the client in developing skills that increase the likelihood of deriving pleasure from behavioral activation (e.g., assertiveness skills, developing an exercise plan, less internal/more external focus, increased social involvement); reinforce success. Objective Identify  important people in life, past and present, and describe the quality, good and poor, of those relationships. Target Date: 2024-05-04 Frequency: monthly  Progress: 20 Modality: individual  Related Interventions Conduct Interpersonal Therapy beginning with the assessment of the client's interpersonal inventory of important past and present relationships; develop a case formulation linking depression to grief, interpersonal role disputes, role transitions, and/or interpersonal deficits). Objective Learn and implement problem-solving and decision-making skills. Target Date: 2024-05-04 Frequency: monthly  Progress: 20 Modality: individual  Related Interventions Conduct Problem-Solving Therapy using techniques such as psychoeducation, modeling, and role-playing to teach client problem-solving skills (i.e., defining a problem specifically, generating possible solutions, evaluating the pros and cons of each solution, selecting and implementing a plan of action, evaluating the efficacy of the plan, accepting or revising the plan); role-play application of the problem-solving skill to a real life issue. Encourage in the client the development of a positive problem orientation in which problems and solving them are viewed as a natural part of life and not something to be feared, despaired, or avoided. 3. Develop healthy interpersonal relationships that lead to the alleviation and help prevent the relapse of depression. 4. Develop healthy thinking patterns and beliefs about self, others, and the world that lead to the alleviation and help prevent the relapse of depression. 5. Recognize, accept, and cope with feelings of depression. Diagnosis F33.1  Conditions For Discharge Achievement of treatment goals and objectives   Veva Alma, LCSW   "

## 2024-01-22 ENCOUNTER — Telehealth: Payer: Self-pay

## 2024-01-22 NOTE — Telephone Encounter (Signed)
 Copied from CRM [id number]. Topic: Clinical - Red Word Triage >> Jan 22, 2024  9:24 AM Ricky Kelly wrote: Red Word that prompted transfer to Nurse Triage:  ankle swelling for 2 weeks Refused nt

## 2024-01-22 NOTE — Telephone Encounter (Signed)
 Pt has appt scheduled for 01/25/23.

## 2024-01-25 ENCOUNTER — Encounter: Payer: Self-pay | Admitting: Internal Medicine

## 2024-01-25 ENCOUNTER — Ambulatory Visit: Admitting: Internal Medicine

## 2024-01-25 VITALS — BP 134/78 | HR 78 | Temp 97.9°F | Ht 66.0 in | Wt 175.0 lb

## 2024-01-25 DIAGNOSIS — R600 Localized edema: Secondary | ICD-10-CM | POA: Diagnosis not present

## 2024-01-25 DIAGNOSIS — R739 Hyperglycemia, unspecified: Secondary | ICD-10-CM

## 2024-01-25 DIAGNOSIS — E559 Vitamin D deficiency, unspecified: Secondary | ICD-10-CM

## 2024-01-25 MED ORDER — PROMETHAZINE HCL 25 MG PO TABS: 25.0000 mg | ORAL_TABLET | Freq: Four times a day (QID) | ORAL | 5 refills | Status: AC | PRN

## 2024-01-25 MED ORDER — HYDROCHLOROTHIAZIDE 12.5 MG PO CAPS: 12.5000 mg | ORAL_CAPSULE | Freq: Every day | ORAL | 3 refills | Status: AC

## 2024-01-25 NOTE — Assessment & Plan Note (Signed)
 With mild worsening x 2wks, for Hct 12.5 mg every day, but declines further evaluation for cardio renal hepatic anemia causes including labs cbc cmet, ua, Echo, cxr for now.

## 2024-01-25 NOTE — Progress Notes (Signed)
 Patient ID: TABER SWEETSER, male   DOB: Jun 01, 1950, 74 y.o.   MRN: [id number]        Chief Complaint: follow up bilateral leg swelling, low vit d, hyperglycemia       HPI:  Ricky Kelly is a 74 y.o. male here with c/o 2 wks onset bilateral leg swelling mild, left a bit worse but Pt denies chest pain, increased sob or doe, wheezing, orthopnea, PND, palpitations, dizziness or syncope.  Denies longer leg dependency such as car rides.   Pt denies polydipsia, polyuria, or new focal neuro s/s.    Pt denies fever, wt loss, night sweats, loss of appetite, or other constitutional symptoms  No overt bleeding.  No hx of cardiac, renal or hepatic disorders.         Wt Readings from Last 3 Encounters:  01/25/24 175 lb (79.4 kg)  03/18/23 170 lb (77.1 kg)  03/09/23 176 lb (79.8 kg)   BP Readings from Last 3 Encounters:  01/25/24 134/78  09/16/23 130/80  03/18/23 (!) 140/82         Past Medical History:  Diagnosis Date   Adjustment disorder 1/10   with depressive symptoms   Allergic rhinitis    Anxiety    Arthritis    Chronic cervical radiculopathy 02/02/2013   Chronic low back pain 08/12/2010   Depression    Hearing loss    sensorineural  bilateral   Hemorrhoids    HOH (hard of hearing)    Hyperlipidemia    Hyperlipidemia    Lumbago    Psoriasis    Retinal tear    bilateral no surgery   Shingles    Past Surgical History:  Procedure Laterality Date   CATARACT EXTRACTION Left    detatched retina Left    laser surgery   EYE SURGERY     HEMORRHOID SURGERY      reports that he has never smoked. He has never used smokeless tobacco. He reports that he does not drink alcohol and does not use drugs. family history includes Cancer in his father and mother; Diabetes in an other family member. Allergies[1] Medications Ordered Prior to Encounter[2]      ROS:  All others reviewed and negative.  Objective        PE:  BP 134/78 (BP Location: Right Arm, Patient Position: Sitting,  Cuff Size: Normal)   Pulse 78   Temp 97.9 F (36.6 C) (Oral)   Ht 5' 6 (1.676 m)   Wt 175 lb (79.4 kg)   SpO2 99%   BMI 28.25 kg/m                 Constitutional: Pt appears in NAD               HENT: Head: NCAT.                Right Ear: External ear normal.                 Left Ear: External ear normal.                Eyes: . Pupils are equal, round, and reactive to light. Conjunctivae and EOM are normal               Nose: without d/c or deformity               Neck: Neck supple. Gross normal ROM  Cardiovascular: Normal rate and regular rhythm.                 Pulmonary/Chest: Effort normal and breath sounds without rales or wheezing.                Abd:  Soft, NT, ND, + BS, no organomegaly               Neurological: Pt is alert. At baseline orientation, motor grossly intact               Skin: Skin is warm. No rashes, no other new lesions, LE edema - trace bilateral leg edema to knees left slightly worse then right               Psychiatric: Pt behavior is normal without agitation   Micro: none  Cardiac tracings I have personally interpreted today:  none  Pertinent Radiological findings (summarize): none   Lab Results  Component Value Date   WBC 7.1 09/10/2023   HGB 13.4 09/10/2023   HCT 39.8 09/10/2023   PLT 189.0 09/10/2023   GLUCOSE 113 (H) 09/10/2023   CHOL 177 09/10/2023   TRIG 125.0 09/10/2023   HDL 46.40 09/10/2023   LDLDIRECT 100.0 09/18/2016   LDLCALC 106 (H) 09/10/2023   ALT 24 09/10/2023   AST 23 09/10/2023   NA 129 (L) 09/10/2023   K 4.1 09/10/2023   CL 94 (L) 09/10/2023   CREATININE 0.86 09/10/2023   BUN 12 09/10/2023   CO2 27 09/10/2023   TSH 2.13 09/10/2023   PSA 1.14 09/10/2023   HGBA1C 6.0 09/10/2023   MICROALBUR <0.7 09/10/2023   Assessment/Plan:  KAIL FRALEY is a 74 y.o. White or Caucasian [1] male with  has a past medical history of Adjustment disorder (1/10), Allergic rhinitis, Anxiety, Arthritis, Chronic cervical  radiculopathy (02/02/2013), Chronic low back pain (08/12/2010), Depression, Hearing loss, Hemorrhoids, HOH (hard of hearing), Hyperlipidemia, Hyperlipidemia, Lumbago, Psoriasis, Retinal tear, and Shingles.  Peripheral edema With mild worsening x 2wks, for Hct 12.5 mg every day, but declines further evaluation for cardio renal hepatic anemia causes including labs cbc cmet, ua, Echo, cxr for now.   Hyperglycemia Lab Results  Component Value Date   HGBA1C 6.0 09/10/2023   Stable, pt to continue current medical treatment  - diet, wt control   Vitamin D  deficiency Last vitamin D  Lab Results  Component Value Date   VD25OH 12.63 (L) 09/10/2023   Low, to start oral replacement   Followup: Return in about 4 weeks (around 02/22/2024).  Lynwood Rush, MD 01/25/2024 6:38 PM [facility]     [1]  Allergies Allergen Reactions   Allegra [Fexofenadine Hcl] Other (See Comments)    constipation   Escitalopram Oxalate     constipation   Naproxen      constipation   Statins Other (See Comments)    constipation  [2]  Current Outpatient Medications on File Prior to Visit  Medication Sig Dispense Refill   acetaminophen  (TYLENOL ) 500 MG tablet Take 500 mg by mouth every 6 (six) hours as needed.     ALPRAZolam  (XANAX ) 1 MG tablet Take 1 tablet by mouth three times daily as needed 90 tablet 2   Calcitriol 3 MCG/GM cream Apply topically at bedtime.     carboxymethylcellulose (REFRESH PLUS) 0.5 % SOLN 1 drop daily as needed.     Cholecalciferol  50 MCG (2000 UT) TABS 1 tab by mouth once daily  30 tablet 99   clotrimazole -betamethasone  (LOTRISONE ) cream Apply 1 application topically daily. 30 g 2   dimenhyDRINATE (DRAMAMINE) 50 MG tablet Take 25 mg by mouth daily.     ibuprofen  (ADVIL ) 800 MG tablet TAKE 1 TABLET BY MOUTH EVERY 8 HOURS AS NEEDED 60 tablet 2   ketoconazole  (NIZORAL ) 2 % cream Apply 1 Application topically daily. 30 g 0    Polyethyl Glycol-Propyl Glycol (SYSTANE OP) Apply to eye 3 (three) times daily.     tiZANidine  (ZANAFLEX ) 4 MG tablet Take 1 tablet (4 mg total) by mouth every 6 (six) hours as needed for muscle spasms. 120 tablet 2   No current facility-administered medications on file prior to visit.

## 2024-01-25 NOTE — Assessment & Plan Note (Signed)
 Last vitamin D  Lab Results  Component Value Date   VD25OH 12.63 (L) 09/10/2023   Low, to start oral replacement

## 2024-01-25 NOTE — Patient Instructions (Addendum)
 Please take all new medication as prescribed-  the very mild fluid pill once in the AM  Please continue all other medications as before, and refills have been done - the promethazine   Please have the pharmacy call with any other refills you may need.  Please keep your appointments with your specialists as you may have planned  Please make an Appointment to return in 1 months, or sooner if needed

## 2024-01-25 NOTE — Assessment & Plan Note (Signed)
 Lab Results  Component Value Date   HGBA1C 6.0 09/10/2023   Stable, pt to continue current medical treatment  - diet, wt control

## 2024-01-28 ENCOUNTER — Telehealth: Payer: Self-pay

## 2024-01-28 NOTE — Telephone Encounter (Signed)
 Ok sure, this was meant to be a chronic long term medication, so I hope he is able to do this    Thanks!

## 2024-01-28 NOTE — Telephone Encounter (Signed)
 Copied from CRM #[id number]. Topic: Clinical - Medication Question >> Jan 28, 2024  2:17 PM [name] wrote: Reason for CRM: Patient is calling in to find out the length of time he's suppose to be taking the medication hydrochlorothiazide  (MICROZIDE ) 12.5 MG capsule

## 2024-01-28 NOTE — Telephone Encounter (Signed)
 Called and left voice mail

## 2024-02-04 ENCOUNTER — Ambulatory Visit (INDEPENDENT_AMBULATORY_CARE_PROVIDER_SITE_OTHER): Admitting: Psychology

## 2024-02-04 ENCOUNTER — Telehealth: Payer: Self-pay

## 2024-02-04 DIAGNOSIS — F331 Major depressive disorder, recurrent, moderate: Secondary | ICD-10-CM

## 2024-02-04 NOTE — Progress Notes (Signed)
 "  [facility] Counselor/Therapist Progress Note  Patient ID: Ricky Kelly, MRN: [id number],    Date: 02/04/2024  Time Spent: 11:00am-11:55am  55 minutes   Treatment Type: Individual Therapy  Reported Symptoms: stress, worrying  Mental Status Exam: Appearance:  Casual     Behavior: Appropriate  Motor: Normal  Speech/Language:  Normal Rate  Affect: Appropriate  Mood: normal  Thought process: normal  Thought content:   WNL  Sensory/Perceptual disturbances:   WNL  Orientation: oriented to person, place, time/date, and situation  Attention: Good  Concentration: Good  Memory: WNL  Fund of knowledge:  Good  Insight:   Good  Judgment:  Good  Impulse Control: Good   Risk Assessment: Danger to Self:  No Self-injurious Behavior: No Danger to Others: No Duty to Warn:no Physical Aggression / Violence:No  Access to Firearms a concern: No  Gang Involvement:No   Subjective: Ricky Kelly present for face-to-face individual therapy via phone.  The session is audio only bc the patient lacks the ability and technology to perform a video call. Ricky consents to telehealth session and is aware of limitations and benefits of virtual sessions.   Location of Ricky: home Location of therapist: home office.   Ricky talked about his holidays.   His daughter and her partner were with Ricky on Christmas Day and they had a nice time.   Ricky talked about his friend Ricky Kelly who is 62 years old.  They have been friends for 50 years.   Ricky has noticed that Ricky Kelly's memory has started to decline.  Ricky Kelly does not have a big support system regarding family other than his sister who handles Ricky Kelly's affairs and takes him to doctors' appointments.  Ricky talks with Ricky Kelly once a week.  The conversations are more difficult bc of Ricky Kelly's cognitive decline.   Addressed how Ricky can effectively communicate with his friend.  Ricky talked about his relationship with his son Ricky Kelly.  Ricky is glad that he and Ricky Kelly text daily and  are back in each other's lives.   Ricky Kelly has still not told his mother they are in contact.  Ricky is worried about Ricky Kelly lying to his mother and the consequences that could have.   Addressed the issues with Ricky and helped him problem solve how to communicate with Ricky Kelly.  Helped Ricky process relationship dynamics.  Ricky talked about his health.   He has been to the doctors a lot already this year.  Addressed Ricky's health issues. Worked on self care strategies. Provided supportive therapy.  Interventions: Cognitive Behavioral Therapy and Insight-Oriented  Diagnosis:  F33.1  Plan of Care:  Recommend ongoing therapy. Ricky participated in setting treatment goals.  Ricky wants a safe place to talk and to vent.  Ricky wants to improve coping skills.   Plan to meet once a month.  Ricky agrees with treatment plan.    Treatment Plan  (target date:  05/04/2024) Client Abilities/Strengths  Ricky is engaging, and motivated for therapy.  Client Treatment Preferences  Individual therapy.  Client Statement of Needs  Improve coping skills.  Symptoms  Depressed or irritable mood.  Diminished interest in or enjoyment of activities.  Problems Addressed  Unipolar Depression Goals 1. Alleviate depressive symptoms and return to previous level of effective functioning. 2. Appropriately grieve the loss in order to normalize mood and to return to previously adaptive level of functioning. Objective Learn and implement behavioral strategies to overcome depression. Target Date: 2024-05-04 Frequency: monthly  Progress: 20 Modality: individual  Related Interventions Engage the client in behavioral activation, increasing his/her activity level and contact with sources of reward, while identifying processes that inhibit activation.  Use behavioral techniques such as instruction, rehearsal, role-playing, role reversal, as needed, to facilitate activity in the client's daily life; reinforce success. Assist the client in developing skills  that increase the likelihood of deriving pleasure from behavioral activation (e.g., assertiveness skills, developing an exercise plan, less internal/more external focus, increased social involvement); reinforce success. Objective Identify important people in life, past and present, and describe the quality, good and poor, of those relationships. Target Date: 2024-05-04 Frequency: monthly  Progress: 20 Modality: individual  Related Interventions Conduct Interpersonal Therapy beginning with the assessment of the client's interpersonal inventory of important past and present relationships; develop a case formulation linking depression to grief, interpersonal role disputes, role transitions, and/or interpersonal deficits). Objective Learn and implement problem-solving and decision-making skills. Target Date: 2024-05-04 Frequency: monthly  Progress: 20 Modality: individual  Related Interventions Conduct Problem-Solving Therapy using techniques such as psychoeducation, modeling, and role-playing to teach client problem-solving skills (i.e., defining a problem specifically, generating possible solutions, evaluating the pros and cons of each solution, selecting and implementing a plan of action, evaluating the efficacy of the plan, accepting or revising the plan); role-play application of the problem-solving skill to a real life issue. Encourage in the client the development of a positive problem orientation in which problems and solving them are viewed as a natural part of life and not something to be feared, despaired, or avoided. 3. Develop healthy interpersonal relationships that lead to the alleviation and help prevent the relapse of depression. 4. Develop healthy thinking patterns and beliefs about self, others, and the world that lead to the alleviation and help prevent the relapse of depression. 5. Recognize, accept, and cope with feelings of depression. Diagnosis F33.1  Conditions For  Discharge Achievement of treatment goals and objectives   Veva Alma, LCSW    "

## 2024-02-04 NOTE — Telephone Encounter (Signed)
 Copied from CRM [id number]. Topic: Clinical - Medication Question >> Feb 04, 2024  3:48 PM Ricky Kelly wrote: Reason for CRM: Patient called asking why he was prescribed ketoconazole  (NIZORAL ) 2 % cream [[id number]]. He said he had an office visit 09/16/23. Advised reason for appointment that day and provider nurse will follow up with clarity of medication by tomorrow.

## 2024-02-05 ENCOUNTER — Other Ambulatory Visit: Payer: Self-pay | Admitting: Internal Medicine

## 2024-02-05 ENCOUNTER — Ambulatory Visit: Payer: Self-pay

## 2024-02-05 MED ORDER — KETOCONAZOLE 2 % EX CREA: 1.0000 | TOPICAL_CREAM | Freq: Every day | CUTANEOUS | 1 refills | Status: AC

## 2024-02-05 MED ORDER — KETOCONAZOLE 2 % EX CREA: 1.0000 | TOPICAL_CREAM | Freq: Every day | CUTANEOUS | 1 refills | Status: DC

## 2024-02-05 NOTE — Telephone Encounter (Signed)
 Ok sorry, I received a request, so I did the refill.   He would not have to pick this up if he does not feel he needs this.  Thanks!

## 2024-02-05 NOTE — Telephone Encounter (Signed)
 Please call back to further advise patient.    Location and reason for topical application is not specified in order, patient has irritation on face per call notes.    Patient needing order clarification for purpose and use of this medication.      Copied from CRM [id number]. Topic: Clinical - Medication Question >> Feb 04, 2024  3:48 PM Ricky Kelly wrote: Reason for CRM: Patient called asking why he was prescribed ketoconazole  (NIZORAL ) 2 % cream [[id number]]. He said he had an office visit 09/16/23. Advised reason for appointment that day and provider nurse will follow up with clarity of medication by tomorrow.   >> Feb 05, 2024  2:49 PM Ricky Kelly wrote: Pt stated that he is requesting a callback from nurse, He wants to know why he was prescribed this medicayion on 08/29. He stated that he has irritation on his face and wants to know if it can be used for that. Please call pt back no one has returned call from yesterday.

## 2024-02-05 NOTE — Telephone Encounter (Signed)
 Called and spoke with Pt , Pt states understanding no further questions at this time.

## 2024-02-05 NOTE — Telephone Encounter (Signed)
 Patient called asking why he was prescribed ketoconazole  (NIZORAL ) 2 % cream [[id number]]. He said he had an office visit 09/16/23. Advised reason for appointment that day and provider nurse will follow up with clarity of medication by tomorrow.

## 2024-02-05 NOTE — Telephone Encounter (Signed)
 Ok this is refill is done, thanks

## 2024-02-08 NOTE — Telephone Encounter (Signed)
 This has been addressed in another encounter.

## 2024-02-17 ENCOUNTER — Ambulatory Visit: Admitting: Internal Medicine

## 2024-03-03 ENCOUNTER — Ambulatory Visit: Admitting: Psychology

## 2024-03-09 ENCOUNTER — Ambulatory Visit: Payer: Medicare Other

## 2024-03-10 ENCOUNTER — Ambulatory Visit

## 2024-03-18 ENCOUNTER — Ambulatory Visit: Admitting: Internal Medicine

## 2024-03-31 ENCOUNTER — Ambulatory Visit: Admitting: Psychology

## 2024-04-14 ENCOUNTER — Encounter: Admitting: Family Medicine
